# Patient Record
Sex: Female | Born: 1935
Health system: Southern US, Community
[De-identification: ages and names within clinical notes are randomized; demographics above are authoritative.]

## PROBLEM LIST (undated history)

## (undated) DIAGNOSIS — C44301 Unspecified malignant neoplasm of skin of nose: Secondary | ICD-10-CM

## (undated) DIAGNOSIS — N280 Ischemia and infarction of kidney: Secondary | ICD-10-CM

## (undated) DIAGNOSIS — I10 Essential (primary) hypertension: Secondary | ICD-10-CM

## (undated) DIAGNOSIS — E785 Hyperlipidemia, unspecified: Secondary | ICD-10-CM

## (undated) DIAGNOSIS — K219 Gastro-esophageal reflux disease without esophagitis: Secondary | ICD-10-CM

## (undated) DIAGNOSIS — I739 Peripheral vascular disease, unspecified: Secondary | ICD-10-CM

## (undated) DIAGNOSIS — C44622 Squamous cell carcinoma of skin of right upper limb, including shoulder: Secondary | ICD-10-CM

## (undated) DIAGNOSIS — E871 Hypo-osmolality and hyponatremia: Secondary | ICD-10-CM

## (undated) DIAGNOSIS — Z8619 Personal history of other infectious and parasitic diseases: Secondary | ICD-10-CM

## (undated) DIAGNOSIS — Z8639 Personal history of other endocrine, nutritional and metabolic disease: Secondary | ICD-10-CM

## (undated) HISTORY — PX: GANGLION CYST EXCISION: SHX1691

## (undated) HISTORY — PX: JOINT REPLACEMENT: SHX530

## (undated) HISTORY — PX: ABDOMINAL HYSTERECTOMY: SHX81

## (undated) HISTORY — DX: Ischemia and infarction of kidney: N28.0

## (undated) HISTORY — DX: Hyperlipidemia, unspecified: E78.5

## (undated) HISTORY — DX: Essential (primary) hypertension: I10

---

## 1997-12-19 ENCOUNTER — Encounter: Admission: RE | Admit: 1997-12-19 | Discharge: 1998-03-12 | Payer: Self-pay | Admitting: Anesthesiology

## 1998-03-12 ENCOUNTER — Encounter: Admission: RE | Admit: 1998-03-12 | Discharge: 1998-05-29 | Payer: Self-pay | Admitting: Anesthesiology

## 1998-05-29 ENCOUNTER — Encounter: Admission: RE | Admit: 1998-05-29 | Discharge: 1998-08-27 | Payer: Self-pay | Admitting: Anesthesiology

## 2000-08-25 ENCOUNTER — Encounter: Admission: RE | Admit: 2000-08-25 | Discharge: 2000-08-25 | Payer: Self-pay | Admitting: Nephrology

## 2000-08-25 ENCOUNTER — Encounter: Payer: Self-pay | Admitting: Nephrology

## 2000-09-07 ENCOUNTER — Ambulatory Visit (HOSPITAL_COMMUNITY): Admission: RE | Admit: 2000-09-07 | Discharge: 2000-09-07 | Payer: Self-pay | Admitting: Nephrology

## 2000-09-07 ENCOUNTER — Encounter: Payer: Self-pay | Admitting: Nephrology

## 2000-09-09 ENCOUNTER — Ambulatory Visit (HOSPITAL_COMMUNITY): Admission: RE | Admit: 2000-09-09 | Discharge: 2000-09-09 | Payer: Self-pay | Admitting: Nephrology

## 2000-09-19 ENCOUNTER — Encounter: Payer: Self-pay | Admitting: Nephrology

## 2000-09-19 ENCOUNTER — Ambulatory Visit (HOSPITAL_COMMUNITY): Admission: RE | Admit: 2000-09-19 | Discharge: 2000-09-19 | Payer: Self-pay | Admitting: Nephrology

## 2001-10-04 ENCOUNTER — Encounter: Payer: Self-pay | Admitting: Orthopedic Surgery

## 2001-10-11 ENCOUNTER — Inpatient Hospital Stay (HOSPITAL_COMMUNITY): Admission: RE | Admit: 2001-10-11 | Discharge: 2001-10-16 | Payer: Self-pay | Admitting: Orthopedic Surgery

## 2001-10-11 ENCOUNTER — Encounter: Payer: Self-pay | Admitting: Orthopedic Surgery

## 2002-07-16 IMAGING — XA IR ANGIO/RENAL UNI WO/W FLUSH*L*
1 series · 15 of 23 positions shown · IV contrast (omnipaque)
Comparison: none

FINDINGS
CLINICAL DATA: REFRACTORY HYPERTENSION.  ULTRASOUND EXAMINATION HAS SHOWN AN ATROPIC RIGHT KIDNEY
AND A NORMAL SIZED LEFT KIDNEY.  NUCLEAR MEDICINE STUDY HAS SHOWN MINIMAL FUNCTION OF THE RIGHT
KIDNEY.  FURTHER ASSESSMENT IS NOW PERFORMED TO EVALUATE FOR ELEVATED RENIN PRODUCTION FROM THE
ATROPHIC RIGHT KIDNEY AND ALSO ASSESS THE LEFT RENAL ARTERY FOR POTENTIAL UNDERLYING STENOSIS.
BILATERAL RENAL VENOGRAPHY:
BILATERAL RENAL VEIN AND IVC VENOUS SAMPLING:
LEFT RENAL ARTERIOGRAPHY INCLUDING ABDOMINAL AORTOGRAPHY:
CONTRAST:   50 CC OMNIPAQUE 300 FOR THE ENTIRE STUDY.
FLUORO TIME:  8.3 MINUTES.
INFORMED CONSENT WAS OBTAINED PRIOR TO THE PROCEDURE.
THE RIGHT GROIN WAS STERILELY PREPPED AND DRAPED.  LOCAL ANESTHESIA WAS PROVIDED WITH 1% LIDOCAINE.
 THE RIGHT COMMON FEMORAL VEIN WAS ACCESSED UTILIZING A MICROPUNCTURE SET AND ULTRASOUND GUIDANCE.
A DIAGNOSTIC WIRE WAS ADVANCED INTO THE IVC.  A 5 FRENCH COBRA CATHETER WAS ADVANCED INTO THE VEIN.
 CONTRAST INJECTION WAS PERFORMED UNDER FLUOROSCOPY TO CONFIRM POSITION.
THE RIGHT RENAL ORIFICE WAS SELECTIVELY CATHETERIZED WITH A CATHETER.  A VENOGRAM WAS PERFORMED TO
CONFIRM POSITIONING AND ANATOMY.
THE CATHETER WAS THEN USED TO SELECTIVELY CATHETERIZE THE ORIFICE OF THE LEFT RENAL VEIN. THE
CATHETER WAS ADVANCED OVER A GUIDEWIRE INTO A SECOND ORDER LEFT RENAL VEIN BRANCH AND CONTRAST
INJECTION VENOGRAPHY PERFORMED.

[Series 1: run · 15 of 23 slices shown]
[im 1/23]
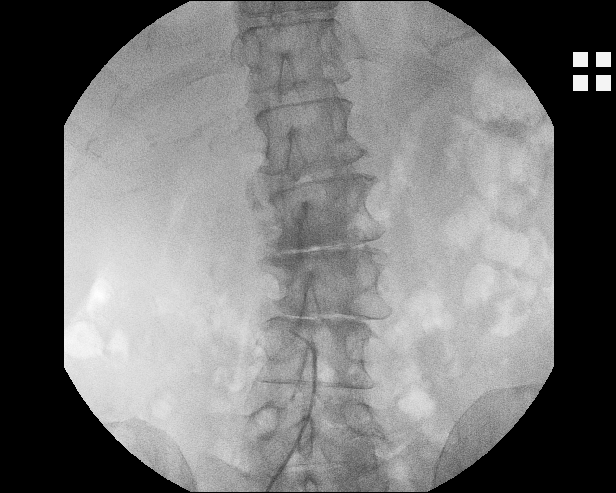
[im 3/23]
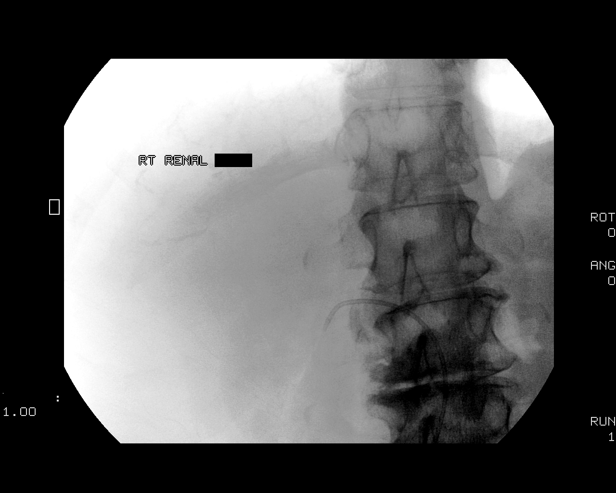
[im 4/23]
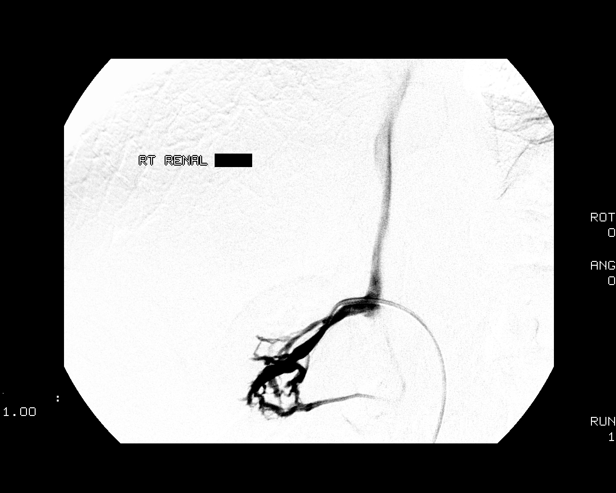
[im 6/23]
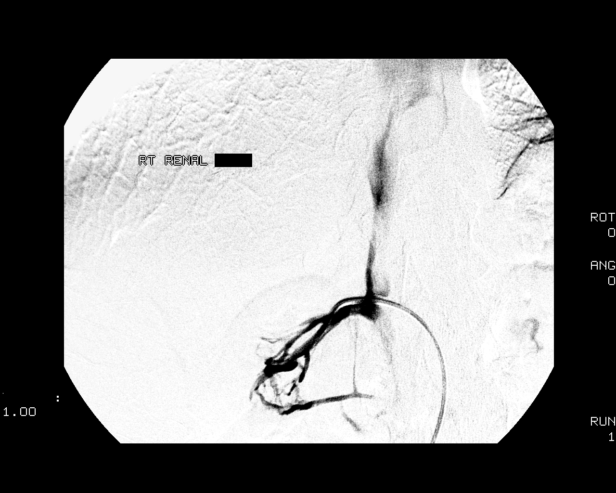
[im 7/23]
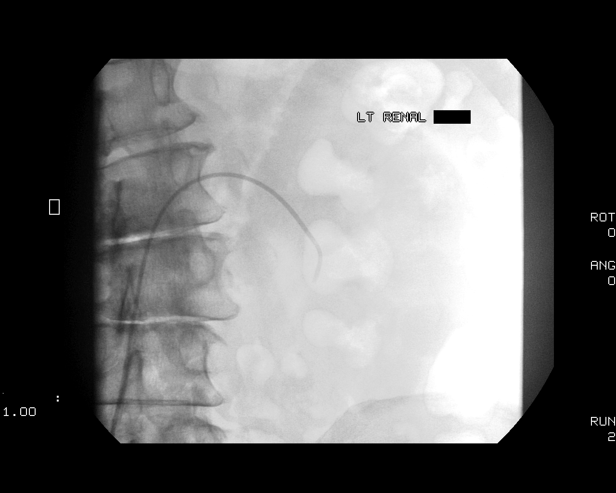
[im 9/23]
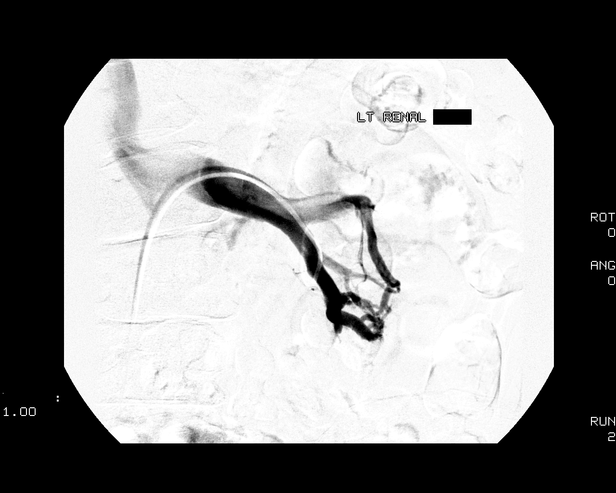
[im 10/23]
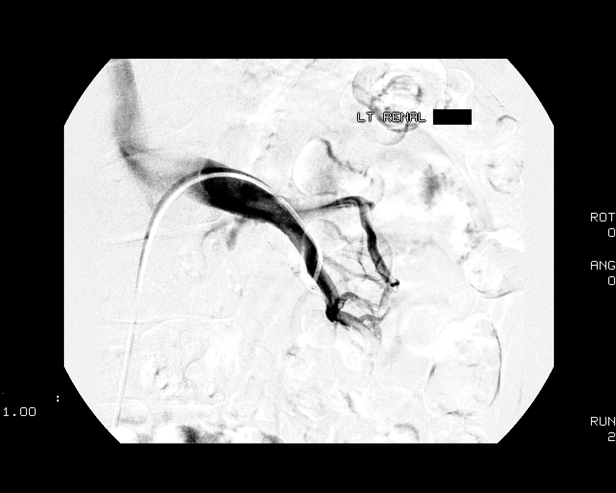
[im 12/23]
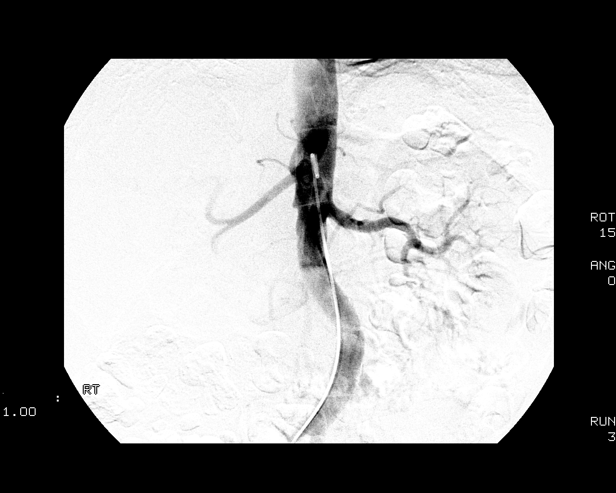
[im 14/23]
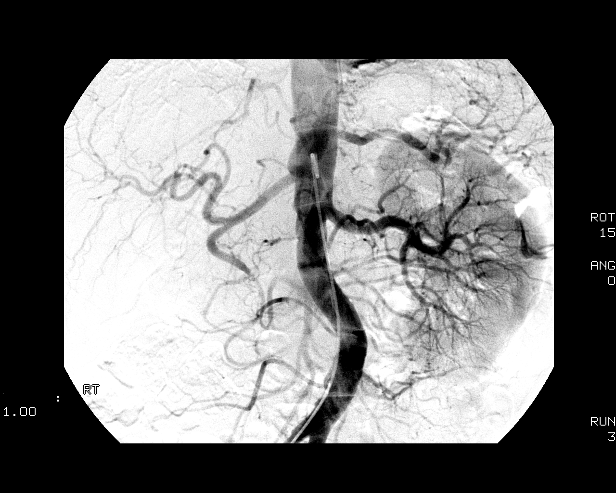
[im 15/23]
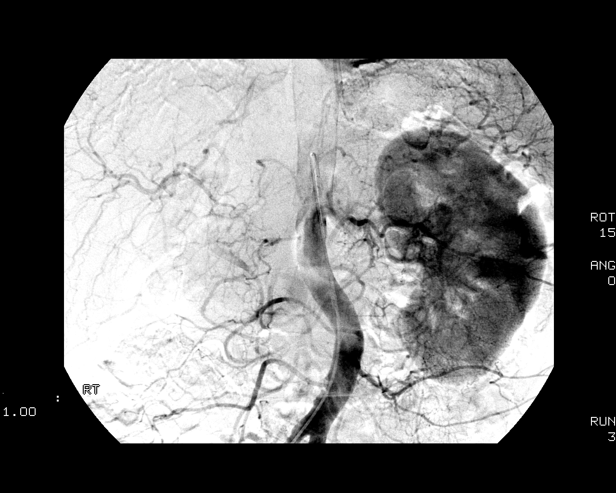
[im 17/23]
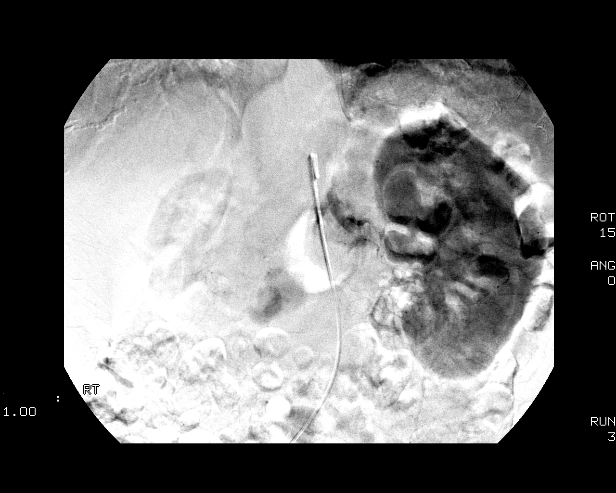
[im 18/23]
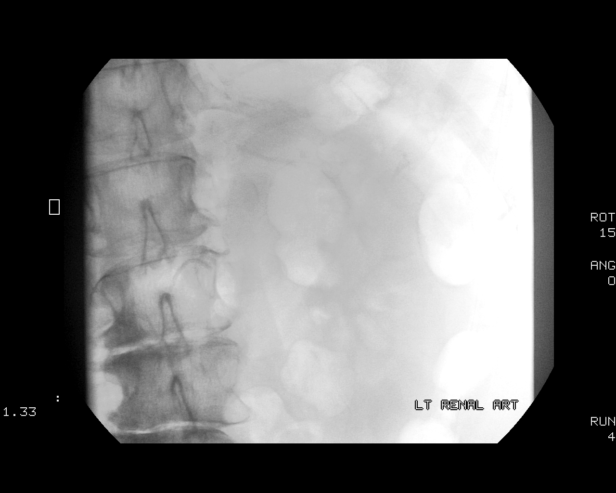
[im 20/23]
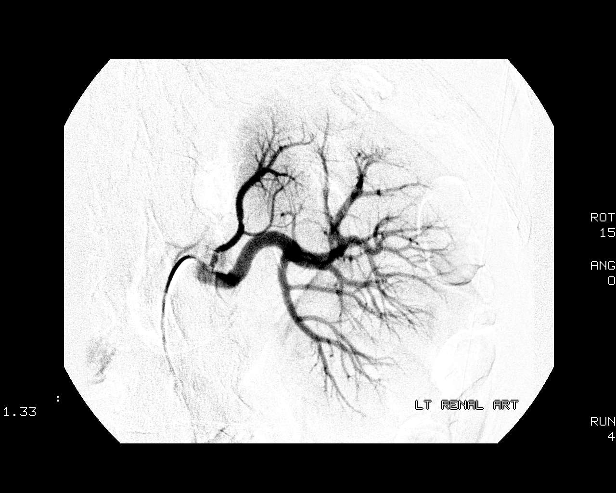
[im 21/23]
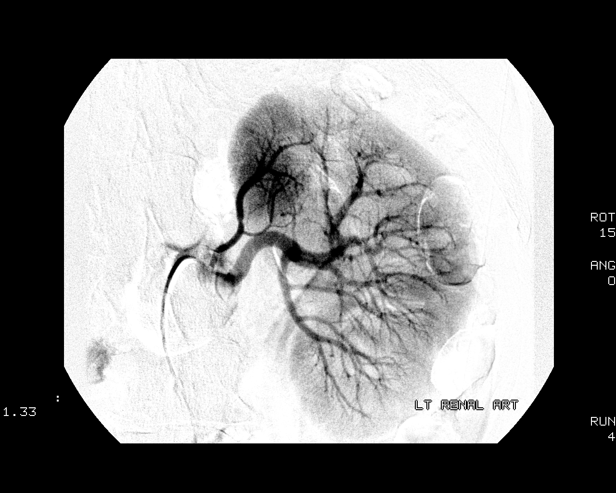
[im 23/23]
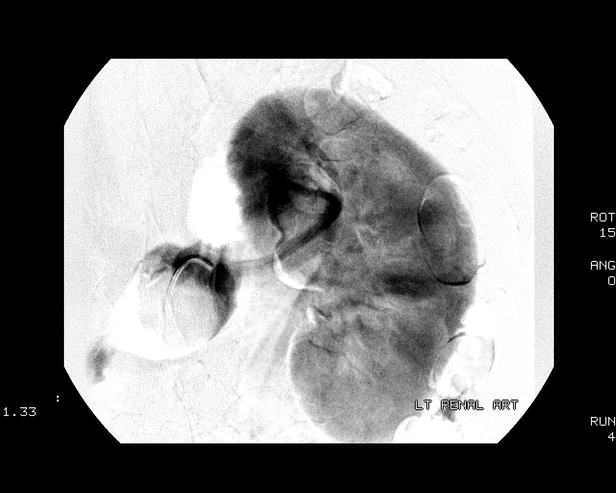

[15 of 23 positions shown; findings below may reference images not displayed]

FINDINGS: THE RIGHT RENAL VEIN IS PATENT AND DIFFUSELY SMALL IN CALIBER.  BRANCH VESSELS ARE ALSO SMALL AND
DRAIN A VISIBLY ATROPHIC KIDNEY.  NO EVIDENCE OF THROMBUS IN THE RIGHT RENAL VEIN.
LEFT RENAL VEIN VENOGRAPHY WAS PERFORMED WITH INJECTION AT THE LEVEL OF A SECOND ORDER INTRARENAL
BRANCH.  THE LEFT RENAL VEIN IS WIDELY PATENT AND NORMALLY POSITIONED WITHOUT ANATOMIC ABNORMALITY.
 NO EVIDENCE OF VENOUS THROMBUS.
IMPRESSION
RENAL VEIN VENOGRAPHY PRIOR TO VENOUS SAMPLING SHOWS AN ATROPHIC RIGHT RENAL VEIN AND NORMAL LEFT
RENAL VEIN.  BOTH VEINS SHOW FLOW WITH NO EVIDENCE OF THROMBUS OR UNDERLYING ANATOMIC VARIATION IN
POSITION.
BILATERAL RENAL VEIN SAMPLING:
AFTER SELECTIVE CATHETERIZATION FOR BILATERAL RENAL VENOGRAPHY, VENOUS SAMPLES WERE OBTAINED FROM
BOTH THE LEFT AND RIGHT RENAL VEINS FOR RENIN LEVELS.  SEPARATE IVC SAMPLES WERE ALSO DRAWN FROM
THE SUPRARENAL AND INFRARENAL IVC.   SAMPLES WERE LABELED IN STERILE TUBES AND SENT TO THE
LABORATORY.
IMPRESSION: SELECTIVE VENOUS SAMPLING OF BOTH RENAL VEINS AS WELL AS THE SUPRARENAL AND INFRARENAL IVC.
LEFT RENAL ARTERIOGRAPHY INCLUDING ABDOMINAL AORTOGRAM:
AFTER THE VENOUS PROCEDURE, THE VENOUS CATHETER WAS REMOVED AND HEMOSTASIS OBTAINED WITH MANUAL
COMPRESSION.  THE RIGHT COMMON FEMORAL ARTERY WAS THEN ACCESSED WITH A MICROPUNTURE SET UNDER
ULTRASOUND GUIDANCE.  A 4 FRENCH PIGTAIL CATHETER WAS ADVANCED INTO THE MID-ABDOMINAL AORTA AND A
FRONTAL PROJECTION AORTOGRAM PERFORMED.  THE CATHETER WAS THEN EXCHANGED OVER A GUIDEWIRE FOR A 4
FRENCH COBRA CATHETER.  THIS WAS USED TO SELECTIVELY CATHETERIZE THE LEFT RENAL ARTERY.  SELECTIVE
LEFT RENAL ARTERIOGRAPHY WAS THEN PERFORMED.
AFTER THE PROCEDURE, THE CATHETER WAS REMOVED AND HEMOSTASIS OBTAINED WITH MANUAL COMPRESSION.
COMPLICATIONS:  NONE.
FINDINGS: THE RIGHT RENAL ARTERY IS CHRONICALLY OCCLUDED WITH NO ANTEGRADE FLOW PRESENT BEYOND THE ORIGIN OF
THE VESSEL.  A SMALL STUMP IS PRESENT WHERE THE NATIVE RIGHT RENAL ARTERY HAS OCCLUDED PROXIMALLY.
CAPSULAR BRANCHES ARE RECONSTITUTED BY COLLATERALS WITH DELAYED VISUALIZATION OF A VERY FAINT RIGHT
NEPHROGRAM AND A VERY SMALL RIGHT KIDNEY.
THE LEFT KIDNEY IS SUPPLIED BY A SINGLE ARTERY WHICH DEMONSTRATES AN EARLY BIFURCATION.  THIS
ARTERY AND ITS BRANCHES ARE WIDELY PATENT WITH NO EVIDENCE OF STENOSIS.  A FULL AND NORMAL
NEPHROGRAM IS ACHIEVED WITH NO EVIDENCE OF DEFECT.  NO ACCESSORY VESSELS WERE IDENTIFIED.  NO
EVIDENCE OF RENAL ARTERY ANEURYSM OR FINDINGS OF FIBROMUSCULAR DISEASE.  VENOUS PHASE IS NORMAL
WITH PATENT FLOW NOTED IN THE LEFT RENAL VEIN.
THE REST OF THE ABDOMINAL AORTA IS UNREMARKABLE.  THE LOWER ABDOMINAL AORTIC SEGMENT IS TORTUOUS
WITHOUT EVIDENCE OF ANEURYSM OR STENOSIS.  OTHER VISUALIZED BRANCH VESSELS OFF OF THE ABDOMINAL
AORTA ARE UNREMARKABLE IN THE FRONTAL PROJECTION.
IMPRESSION: 1.  CHRONIC OCCLUSION OF THE RIGHT RENAL ARTERY.  FAINT NEPHROGRAM IS SEEN ON A DELAYED BASIS BY
EVENTUAL RECONSTITUTION OF CAPSULAR COLLATERAL VESSELS.
2.  WIDELY PATENT LEFT RENAL ARTERY AND LEFT RENAL ARTERY BRANCHES.  NO EVIDENCE OF UNDERLYING FMD.

## 2003-03-16 HISTORY — PX: PR VEIN BYPASS GRAFT,AORTO-FEM-POP: 35551

## 2003-03-21 ENCOUNTER — Ambulatory Visit (HOSPITAL_COMMUNITY): Admission: RE | Admit: 2003-03-21 | Discharge: 2003-03-21 | Payer: Self-pay | Admitting: Vascular Surgery

## 2003-07-08 ENCOUNTER — Inpatient Hospital Stay (HOSPITAL_COMMUNITY): Admission: RE | Admit: 2003-07-08 | Discharge: 2003-07-12 | Payer: Self-pay | Admitting: Vascular Surgery

## 2004-11-25 ENCOUNTER — Ambulatory Visit: Payer: Self-pay | Admitting: Gastroenterology

## 2004-12-10 ENCOUNTER — Encounter (INDEPENDENT_AMBULATORY_CARE_PROVIDER_SITE_OTHER): Payer: Self-pay | Admitting: *Deleted

## 2004-12-10 ENCOUNTER — Ambulatory Visit: Payer: Self-pay | Admitting: Gastroenterology

## 2005-04-29 IMAGING — CR DG CHEST 2V
2 series · 2 of 2 positions shown · non-contrast
Comparison: none

CLINICAL DATA: Femoral artery occlusion.  Bypass graft.  The patient is a nonsmoker, has hypertension.  No present chest complaints.
 TWO VIEW CHEST 
 PA and lateral views of the chest are made on 07/04/03 at 1919 hours and are compared to previous studies of 03/19/03 and show no significant interval change or active disease.  Peribronchial markings are minimally accentuated.  The heart, mediastinum, bony thorax and soft tissues are within the normal limit for the patient?s age.

[view not recorded (1 of 2)]
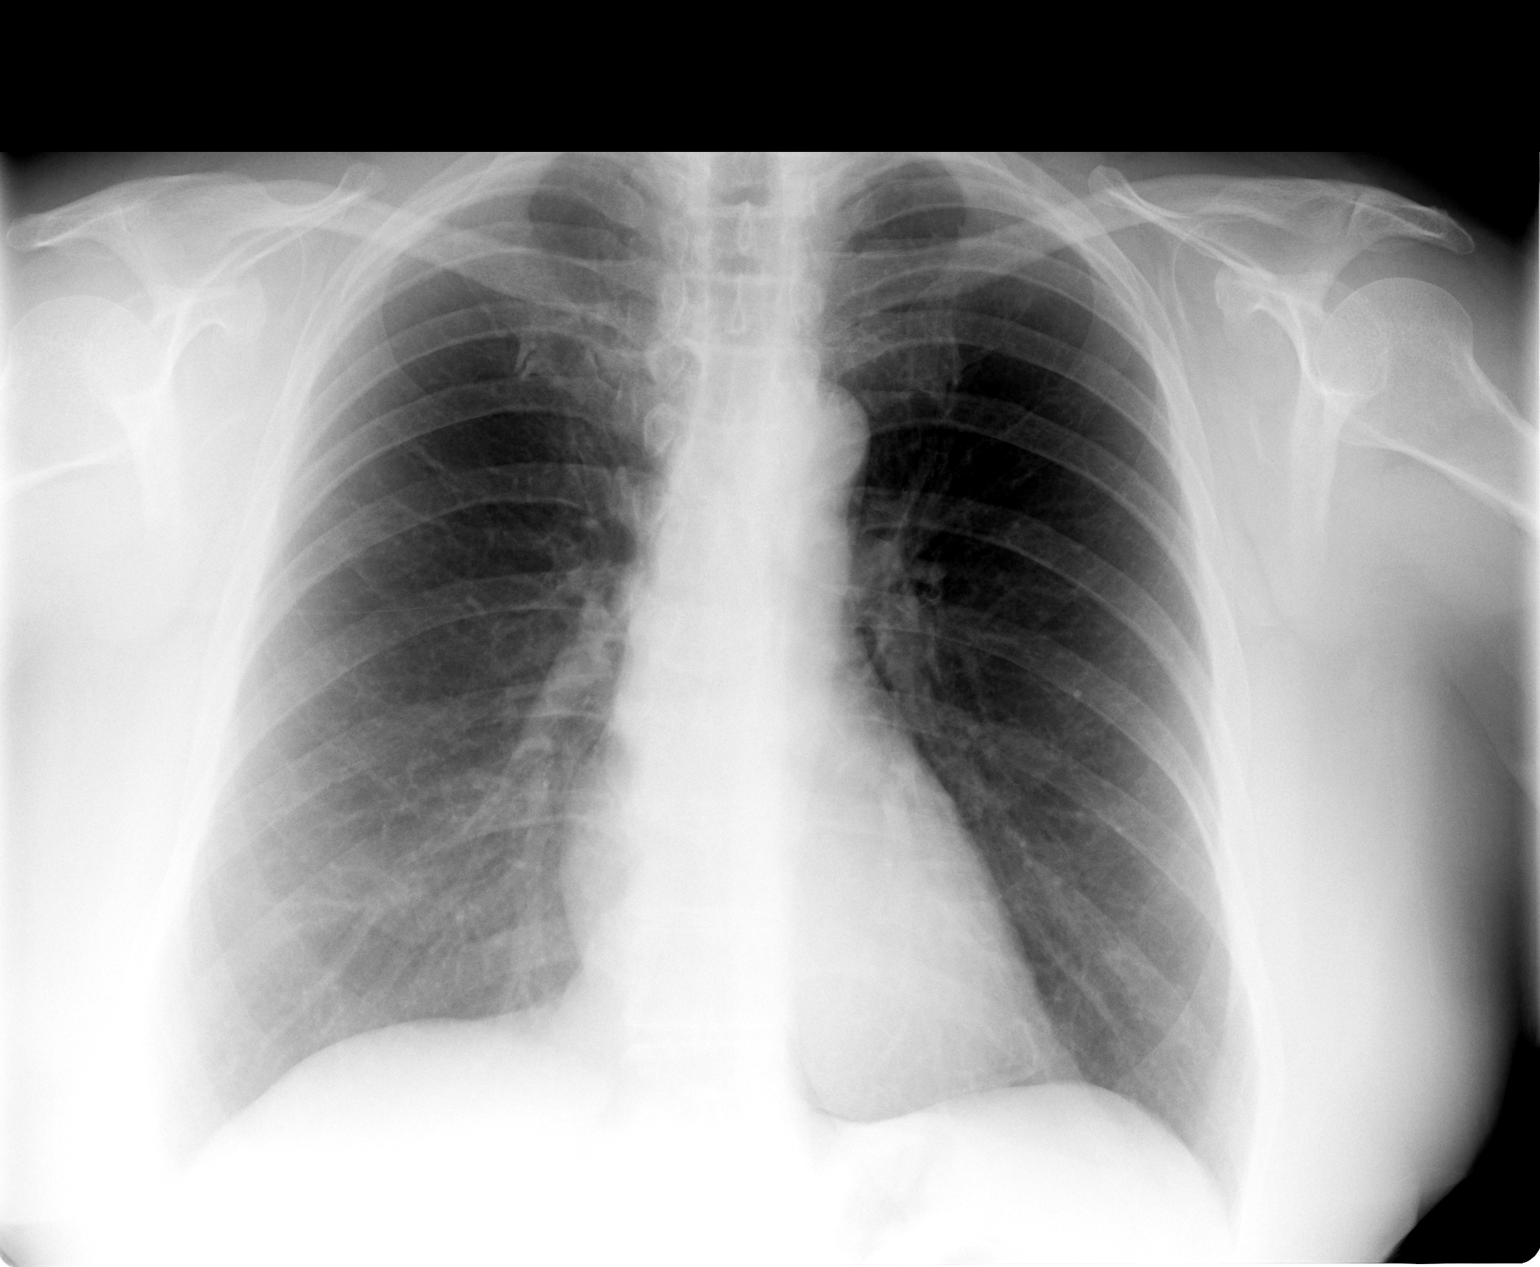

[view not recorded (2 of 2)]
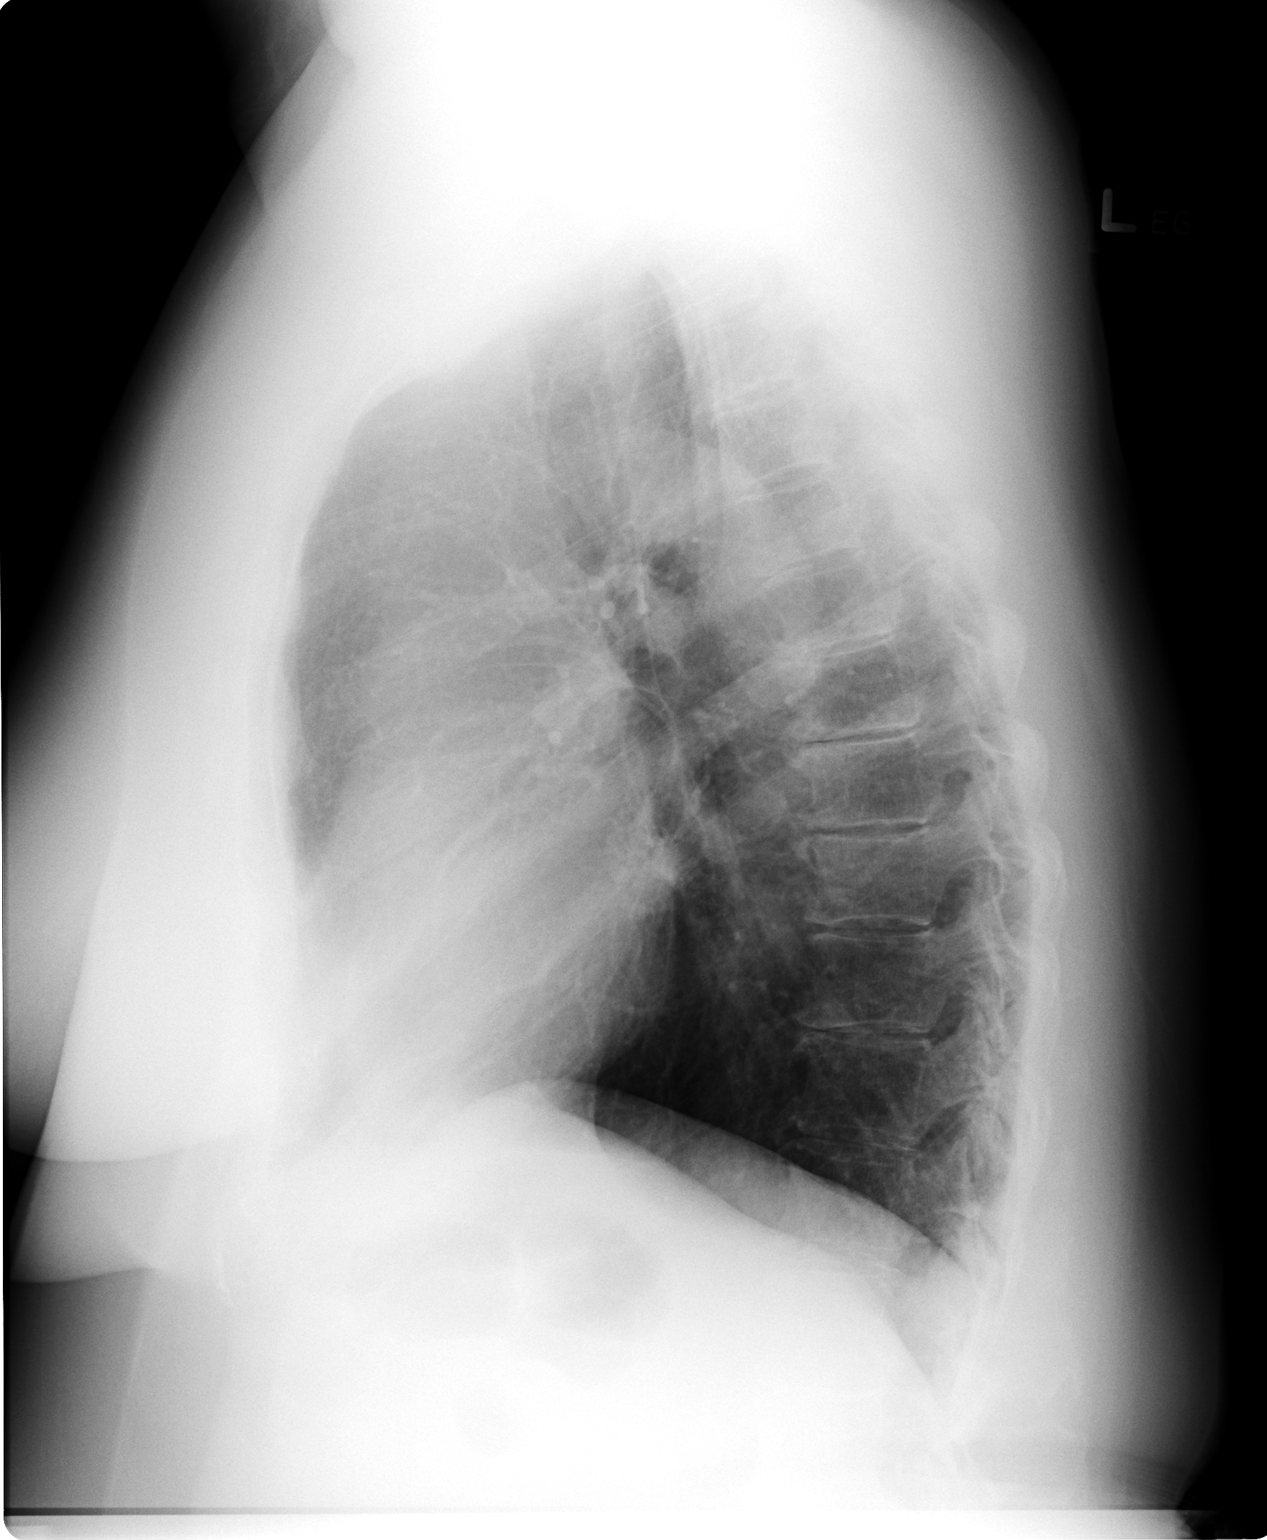

[2 of 2 positions shown; findings below may reference images not displayed]

IMPRESSION: No evidence of active disease or significant interval change in the chest.

## 2006-06-30 ENCOUNTER — Ambulatory Visit: Payer: Self-pay | Admitting: Vascular Surgery

## 2007-01-17 ENCOUNTER — Ambulatory Visit: Payer: Self-pay | Admitting: Vascular Surgery

## 2007-08-01 ENCOUNTER — Ambulatory Visit: Payer: Self-pay | Admitting: Vascular Surgery

## 2008-01-23 ENCOUNTER — Ambulatory Visit: Payer: Self-pay | Admitting: Vascular Surgery

## 2008-07-23 ENCOUNTER — Ambulatory Visit: Payer: Self-pay | Admitting: Vascular Surgery

## 2009-02-04 ENCOUNTER — Ambulatory Visit: Payer: Self-pay | Admitting: Vascular Surgery

## 2009-10-23 ENCOUNTER — Encounter (INDEPENDENT_AMBULATORY_CARE_PROVIDER_SITE_OTHER): Payer: Self-pay | Admitting: *Deleted

## 2010-03-11 ENCOUNTER — Ambulatory Visit: Payer: Self-pay | Admitting: Vascular Surgery

## 2010-04-14 NOTE — Letter (Signed)
Summary: Colonoscopy Letter  [facility]  [address] [place], [place] [address]   Phone: [phone]  Fax: [phone]      October 23, 2009 MRN: [id number]   [facility] [address] [place], [place]  [address]   Dear Ms. Laprade,   According to your medical record, it is time for you to schedule a Colonoscopy. The American Cancer Society recommends this procedure as a method to detect early colon cancer. Patients with a family history of colon cancer, or a personal history of colon polyps or inflammatory bowel disease are at increased risk.  This letter has beeen generated based on the recommendations made at the time of your procedure. If you feel that in your particular situation this may no longer apply, please contact our office.  Please call our office at [phone] to schedule this appointment or to update your records at your earliest convenience.  Thank you for cooperating with [place] to provide you with the very best care possible.   Sincerely,  Judie Petit T. Russella Dar, M.D.  [facility] [phone]

## 2010-04-28 ENCOUNTER — Encounter (INDEPENDENT_AMBULATORY_CARE_PROVIDER_SITE_OTHER): Payer: Medicare Other | Admitting: Vascular Surgery

## 2010-04-28 ENCOUNTER — Other Ambulatory Visit (INDEPENDENT_AMBULATORY_CARE_PROVIDER_SITE_OTHER): Payer: Medicare Other

## 2010-04-28 DIAGNOSIS — I701 Atherosclerosis of renal artery: Secondary | ICD-10-CM

## 2010-04-28 DIAGNOSIS — I70219 Atherosclerosis of native arteries of extremities with intermittent claudication, unspecified extremity: Secondary | ICD-10-CM

## 2010-04-29 NOTE — Consult Note (Signed)
NEW PATIENT CONSULTATION  Erickson, Stacy DOB:  03-13-1936                                       04/28/2010 CHART#:[id number]  Patient is a 75 year old female known to me several years ago, having performed a left femoral-popliteal bypass grafting in 2005.  She is known to have occlusion of her right renal artery and had a widely patent left renal artery in 2005.  She was sent today for follow-up of her left renal artery since she does have a contralateral occlusion. She states her blood pressure has been under good control on 3 medications (Lasix, Diovan, and Altace) and also states that her renal function is stable (today I checked results of recent renal function in Dr. Laurey Morale office with creatinine of 1.1 and a BUN of 33).  CHRONIC MEDICAL PROBLEMS: 1. Hypertension. 2. Hyperlipidemia. 3. Known left right renal artery occlusion. 4. Negative for coronary artery disease, diabetes, COPD, or stroke. 5. Degenerative joint disease, status post left total hip replacement.  SOCIAL HISTORY:  The patient is widowed, is retired.  Does not smoke cigarettes since 2003.  Drinks occasional Mcgroarty of wine.  FAMILY HISTORY:  Positive for stroke and Parkinson's in her father. Coronary artery disease in a sister.  Negative for diabetes.  REVIEW OF SYSTEMS:  Positive for decreased visual acuity, arthritis, joint pain, muscle pain, leg discomfort while lying flat (restless legs) plus dysuria.  All other systems are negative in complete review of systems.  PHYSICAL EXAMINATION:  Blood pressure 164/88, heart rate 82, respirations 16.  General:  She is a well-developed and well-nourished female in no apparent distress.  Alert and oriented x3.  HEENT:  Normal for age.  EOMs intact.  Lungs:  Clear to auscultation.  No rhonchi or wheezing.  Cardiovascular:  Regular rhythm.  No murmurs.  Carotid pulses are 3+.  No audible bruits.  Abdomen:  Soft, nontender with no masses. No bruits  are heard.  Musculoskeletal:  Free of major deformities. Neurologic:  Normal.  Skin:  Free of rashes.  Lower extremity exam reveals 3+ femoral and 2+ dorsalis pedis pulses bilaterally.  Today I ordered a duplex scan of her left renal artery and kidney, which I have reviewed and interpreted.  There is no evidence of any renal artery stenosis or fibromuscular disease in the left renal artery, and the kidney size is normal at 10.7 cm.  I reassured her regarding these findings.  Unless she develops poorly controlled hypertension or worsening renal function, I think it would be safe to wait 4 or 5 years before repeating any study on her left renal artery, which appears to be free of disease at this point consistent with the study 7 years ago.    Quita Skye Hart [place], M.D. Electronically Signed  JDL/MEDQ  D:  04/28/2010  T:  04/29/2010  Job:  [id number]  cc:   Loraine Leriche A. Perini, M.D.

## 2010-05-05 NOTE — Procedures (Unsigned)
RENAL ARTERY DUPLEX EVALUATION  INDICATION:  FMD:  Right kidney/artery nonfunctioning.  HISTORY: Diabetes:  No. Cardiac:  No. Hypertension:  Yes. Smoking:  Yes.  RENAL ARTERY DUPLEX FINDINGS: Aorta-Proximal:  51 cm/s Aorta-Mid:  NV Aorta-Distal:  NV Celiac Artery Origin:  117 cm/s SMA Origin:  216 cm/s                                   RIGHT               LEFT Renal Artery Origin:             cm/s                88 cm/s Renal Artery Proximal:           cm/s                124 cm/s Renal Artery Mid:                cm/s                161 cm/s Renal Artery Distal:             cm/s                89 cm/s Hilar Acceleration Time (AT):    m/s2                m/s2 Renal-Aortic Ratio (RAR):                            3.1 Kidney Size:                                         10.7 cm End Diastolic Ratio (EDR): Resistive Index (RI):                                0.53/0.64  IMPRESSION: 1. No evidence of RAS or active FMD in the left renal artery. 2. Kidney size and RI within normal limits. 3. Patent renal vein.  ___________________________________________ Quita Skye. Hart [place], M.D.  LT/MEDQ  D:  04/28/2010  T:  04/28/2010  Job:  [id number]

## 2010-07-28 NOTE — Procedures (Signed)
BYPASS GRAFT EVALUATION   INDICATION:  Follow up left fem-pop graft.   HISTORY:  Diabetes:  No.  Cardiac:  No.  Hypertension:  Yes.  Smoking:  Previous.  Previous Surgery:  07/08/2003, fem-pop graft placed in left lower  extremity.   SINGLE LEVEL ARTERIAL EXAM                               RIGHT              LEFT  Brachial:                    131                123  Anterior tibial:             122                115  Posterior tibial:            118                114  Peroneal:  Ankle/brachial index:        0.93               0.88   PREVIOUS ABI:  Date: 02/04/09  RIGHT:  0.96  LEFT:  0.93   LOWER EXTREMITY BYPASS GRAFT DUPLEX EXAM:   DUPLEX:  1. Widely patent left fem-pop graft.  2. Stenotic inflow with velocities measuring 366 cm/s.  3. Waveforms are triphasic throughout the graft.   IMPRESSION:  1. Slightly decreased ankle brachial indices from prior examination.  2. Widely patent left femoropopliteal graft.  3. Inflow stenosis observed.   ___________________________________________  Di Kindle. Edilia Bo, M.D.   LT/MEDQ  D:  03/11/2010  T:  03/11/2010  Job:  [id number]

## 2010-07-28 NOTE — Procedures (Signed)
BYPASS GRAFT EVALUATION   INDICATION:  Followup, left fem-pop bypass graft.   HISTORY:  Diabetes:  No.  Cardiac:  No.  Hypertension:  Yes.  Smoking:  Quit in 2005.  Previous Surgery:  Left fem-pop bypass graft.   SINGLE LEVEL ARTERIAL EXAM                               RIGHT              LEFT  Brachial:                    171                164  Anterior tibial:             120                129  Posterior tibial:            143                146  Peroneal:  Ankle/brachial index:        0.84               0.85   PREVIOUS ABI:  Date: 01/17/07  RIGHT:  0.84  LEFT:  0.83   LOWER EXTREMITY BYPASS GRAFT DUPLEX EXAM:   DUPLEX:  Biphasic waveforms noted throughout the left lower extremity  bypass graft and its native vessels with no evidence of stenosis noted.   IMPRESSION:  1. Patent left femoropopliteal bypass graft with no evidence of      stenosis.  2. Stable bilateral ankle brachial indices are noted.   ___________________________________________  Di Kindle. Edilia Bo, M.D.   CH/MEDQ  D:  08/01/2007  T:  08/01/2007  Job:  [id number]

## 2010-07-28 NOTE — Procedures (Signed)
BYPASS GRAFT EVALUATION   INDICATION:  Follow-up left lower extremity bypass graft.   HISTORY:  Diabetes:  No  Cardiac:  No  Hypertension:  Yes  Smoking:  Previous  Previous Surgery:  Left femoral-popliteal bypass artery graft July 08, 2003.   SINGLE LEVEL ARTERIAL EXAM                               RIGHT              LEFT  Brachial:                    151                150  Anterior tibial:             133                132  Posterior tibial:            137                143  Peroneal:  Ankle/brachial index:        0.91               0.95   PREVIOUS ABI:  Date: January 23, 2008  RIGHT:  0.88  LEFT:  0.9   LOWER EXTREMITY BYPASS GRAFT DUPLEX EXAM:   DUPLEX:  Doppler arterial waveforms appear biphasic, proximal to,  within, and distal to bypass graft.   IMPRESSION:  Patent left femoral-popliteal bypass graft.   Stable ankle-brachial indices bilaterally.   No significant changes from previous study.       ___________________________________________  Stacy Erickson, M.D.   AS/MEDQ  D:  07/23/2008  T:  07/23/2008  Job:  [id number]

## 2010-07-28 NOTE — Procedures (Signed)
BYPASS GRAFT EVALUATION   INDICATION:  Follow-up left lower extremity bypass graft.   HISTORY:  Diabetes:  No.  Cardiac:  No.  Hypertension:  Yes.  Smoking:  Previous.  Previous Surgery:  Left fem-pop bypass graft on 07/08/2003.   SINGLE LEVEL ARTERIAL EXAM                               RIGHT              LEFT  Brachial:                    126                120  Anterior tibial:             111                106  Posterior tibial:            108                113  Peroneal:  Ankle/brachial index:        0.88               0.9   PREVIOUS ABI:  Date: 08/01/2007  RIGHT:  0.84  LEFT:  0.85   LOWER EXTREMITY BYPASS GRAFT DUPLEX EXAM:   DUPLEX:  Biphasic Doppler waveforms noted throughout the left lower  extremity bypass graft and its native vessels with no evidence of  stenosis noted.   IMPRESSION:  1. Patent left fem-pop bypass graft with no evidence of stenosis      noted.  2. Stable bilateral ankle brachial indices noted.   ___________________________________________  Di Kindle. Edilia Bo, M.D.   CH/MEDQ  D:  01/23/2008  T:  01/23/2008  Job:  [id number]

## 2010-07-28 NOTE — Procedures (Signed)
BYPASS GRAFT EVALUATION   INDICATION:  Follow-up of left femoral to popliteal artery bypass graft.   HISTORY:  Diabetes:  No  Cardiac:  No  Hypertension:  Yes  Smoking:  Previous  Previous Surgery:  Left femoral to popliteal bypass artery graft on  July 08, 2003.   SINGLE LEVEL ARTERIAL EXAM                               RIGHT              LEFT  Brachial:                    122                118  Anterior tibial:             114                113  Posterior tibial:            117                109  Peroneal:  Ankle/brachial index:        0.96               0.93   PREVIOUS ABI:  Date: Jul 23, 2008  RIGHT:  0.91  LEFT:  0.95   LOWER EXTREMITY BYPASS GRAFT DUPLEX EXAM:   DUPLEX:  Left femoral to popliteal artery bypass graft appears patent  with biphasic waveforms proximally within ad distally to the graft.   IMPRESSION:  1. Ankle brachial indices appear stable from previous study.  2. Patent left femoral to popliteal artery bypass graft with no focal      stenosis.   ___________________________________________  Quita Skye Hart [place], M.D.   CB/MEDQ  D:  02/04/2009  T:  02/04/2009  Job:  [id number]

## 2010-07-28 NOTE — Procedures (Signed)
BYPASS GRAFT EVALUATION   INDICATION:  Follow up left fem-pop bypass graft.   HISTORY:  Diabetes:  No.  Cardiac:  No.  Hypertension:  Yes.  Smoking:  Quit 3 years ago.  Previous Surgery:  Please see above.   SINGLE LEVEL ARTERIAL EXAM                               RIGHT              LEFT  Brachial:                    120                127  Anterior tibial:             88                 101  Posterior tibial:            107                106  Peroneal:  Ankle/brachial index:        0.84               0/83   PREVIOUS ABI:  Date: June 20, 2006  RIGHT:  0.65  LEFT:  0.82   LOWER EXTREMITY BYPASS GRAFT DUPLEX EXAM:   DUPLEX:  Patent left fem-pop bypass throughout with no evidence of focal  stenosis   IMPRESSION:  1. Moderately abnormal ankle-brachial index with biphasic Doppler wave      form noted in bilateral legs.  2. Status post left femoral-popliteal bypass graft.   ___________________________________________  Di Kindle. Edilia Bo, M.D.   MG/MEDQ  D:  01/17/2007  T:  01/18/2007  Job:  [id number]

## 2011-03-24 DIAGNOSIS — H25019 Cortical age-related cataract, unspecified eye: Secondary | ICD-10-CM | POA: Diagnosis not present

## 2011-05-21 DIAGNOSIS — Z1231 Encounter for screening mammogram for malignant neoplasm of breast: Secondary | ICD-10-CM | POA: Diagnosis not present

## 2011-07-13 DIAGNOSIS — D509 Iron deficiency anemia, unspecified: Secondary | ICD-10-CM | POA: Diagnosis not present

## 2011-07-13 DIAGNOSIS — I10 Essential (primary) hypertension: Secondary | ICD-10-CM | POA: Diagnosis not present

## 2011-07-13 DIAGNOSIS — N182 Chronic kidney disease, stage 2 (mild): Secondary | ICD-10-CM | POA: Diagnosis not present

## 2011-07-13 DIAGNOSIS — E785 Hyperlipidemia, unspecified: Secondary | ICD-10-CM | POA: Diagnosis not present

## 2011-07-20 DIAGNOSIS — E785 Hyperlipidemia, unspecified: Secondary | ICD-10-CM | POA: Diagnosis not present

## 2011-07-20 DIAGNOSIS — I10 Essential (primary) hypertension: Secondary | ICD-10-CM | POA: Diagnosis not present

## 2011-07-20 DIAGNOSIS — J449 Chronic obstructive pulmonary disease, unspecified: Secondary | ICD-10-CM | POA: Diagnosis not present

## 2011-07-20 DIAGNOSIS — Z124 Encounter for screening for malignant neoplasm of cervix: Secondary | ICD-10-CM | POA: Diagnosis not present

## 2011-07-20 DIAGNOSIS — Z0000 Encounter for general adult medical examination without abnormal findings: Secondary | ICD-10-CM | POA: Diagnosis not present

## 2011-07-20 DIAGNOSIS — K219 Gastro-esophageal reflux disease without esophagitis: Secondary | ICD-10-CM | POA: Diagnosis not present

## 2011-07-26 DIAGNOSIS — Z1212 Encounter for screening for malignant neoplasm of rectum: Secondary | ICD-10-CM | POA: Diagnosis not present

## 2011-09-01 DIAGNOSIS — Z23 Encounter for immunization: Secondary | ICD-10-CM | POA: Diagnosis not present

## 2011-10-20 ENCOUNTER — Encounter: Payer: Self-pay | Admitting: Vascular Surgery

## 2011-11-17 ENCOUNTER — Encounter: Payer: Self-pay | Admitting: Gastroenterology

## 2011-12-09 ENCOUNTER — Other Ambulatory Visit: Payer: Self-pay | Admitting: *Deleted

## 2011-12-09 DIAGNOSIS — Z48812 Encounter for surgical aftercare following surgery on the circulatory system: Secondary | ICD-10-CM

## 2011-12-09 DIAGNOSIS — I70219 Atherosclerosis of native arteries of extremities with intermittent claudication, unspecified extremity: Secondary | ICD-10-CM

## 2011-12-10 ENCOUNTER — Encounter: Payer: Self-pay | Admitting: Vascular Surgery

## 2011-12-13 ENCOUNTER — Encounter: Payer: Self-pay | Admitting: Neurosurgery

## 2011-12-14 ENCOUNTER — Ambulatory Visit (INDEPENDENT_AMBULATORY_CARE_PROVIDER_SITE_OTHER): Payer: Medicare Other | Admitting: Neurosurgery

## 2011-12-14 ENCOUNTER — Encounter: Payer: Self-pay | Admitting: Neurosurgery

## 2011-12-14 ENCOUNTER — Encounter (INDEPENDENT_AMBULATORY_CARE_PROVIDER_SITE_OTHER): Payer: Medicare Other | Admitting: *Deleted

## 2011-12-14 VITALS — BP 120/80 | HR 77 | Resp 16 | Ht 63.0 in | Wt 170.0 lb

## 2011-12-14 DIAGNOSIS — Z48812 Encounter for surgical aftercare following surgery on the circulatory system: Secondary | ICD-10-CM | POA: Diagnosis not present

## 2011-12-14 DIAGNOSIS — I70219 Atherosclerosis of native arteries of extremities with intermittent claudication, unspecified extremity: Secondary | ICD-10-CM | POA: Diagnosis not present

## 2011-12-14 DIAGNOSIS — I739 Peripheral vascular disease, unspecified: Secondary | ICD-10-CM | POA: Insufficient documentation

## 2011-12-14 NOTE — Progress Notes (Signed)
[facility] Office Note  CC: PD surveillance Referring Physician: Edilia Bo  History of Present Illness: 76 year old female patient of Dr. Edilia Bo who status post left femoral below the knee popliteal artery bypass graft in 2005. Patient denies claudication, rest pain or open ulcerations on the lower extremities. The patient states she has no problems performing and completing her ADLs.  Past Medical History  Diagnosis Date  . Hyperlipidemia   . Hypertension   . Renal artery occlusion     ROS: [x]  Positive   [ ]  Denies    General: [ ]  Weight loss, [ ]  Fever, [ ]  chills Neurologic: [ ]  Dizziness, [ ]  Blackouts, [ ]  Seizure [ ]  Stroke, [ ]  "Mini stroke", [ ]  Slurred speech, [ ]  Temporary blindness; [ ]  weakness in arms or legs, [ ]  Hoarseness Cardiac: [ ]  Chest pain/pressure, [ ]  Shortness of breath at rest [ ]  Shortness of breath with exertion, [ ]  Atrial fibrillation or irregular heartbeat Vascular: [ x] Pain in legs with walking, [ ]  Pain in legs at rest, [ ]  Pain in legs at night,  [ ]  Non-healing ulcer, [ ]  Blood clot in vein/DVT,   Pulmonary: [ ]  Home oxygen, [ x] Productive cough, [ ]  Coughing up blood, [ ]  Asthma,  [ ]  Wheezing Musculoskeletal:  [ ]  Arthritis, [ ]  Low back pain, [ ]  Joint pain Hematologic: [ ]  Easy Bruising, [ ]  Anemia; [ ]  Hepatitis Gastrointestinal: [ ]  Blood in stool, [ ]  Gastroesophageal Reflux/heartburn, [ ]  Trouble swallowing Urinary: [ ]  chronic Kidney disease, [ ]  on HD - [ ]  MWF or [ ]  TTHS, [ ]  Burning with urination, [ ]  Difficulty urinating Skin: [ ]  Rashes, [ ]  Wounds Psychological: [ ]  Anxiety, [ ]  Depression   Social History History  Substance Use Topics  . Smoking status: Former Smoker -- 2.0 packs/day for 35 years    Types: Cigarettes    Quit date: 12/09/2008  . Smokeless tobacco: Not on file  . Alcohol Use: Yes    Family History Family History  Problem Relation Age of Onset  . Cancer Mother   .  Heart disease Father     Allergies  Allergen Reactions  . Codeine Nausea And Vomiting  . Sulfa Antibiotics     Current Outpatient Prescriptions  Medication Sig Dispense Refill  . aspirin 81 MG tablet Take 81 mg by mouth daily.      . Cholecalciferol (VITAMIN D3) 1000 UNITS CAPS Take 1,000 Units by mouth daily.      Marland Kitchen estradiol (CLIMARA - DOSED IN MG/24 HR) 0.025 mg/24hr Place 1 patch onto the skin once a week.      . furosemide (LASIX) 40 MG tablet Take 40 mg by mouth daily.      Marland Kitchen loratadine (CLARITIN) 10 MG tablet Take 10 mg by mouth daily.      Marland Kitchen LORazepam (ATIVAN) 0.5 MG tablet Take 0.5 mg by mouth at bedtime.      . Multiple Vitamin (MULTIVITAMIN) capsule Take 1 capsule by mouth daily.      . nicotine (NICODERM CQ - DOSED IN MG/24 HOURS) 14 mg/24hr patch Place 1 patch onto the skin daily.      Marland Kitchen omeprazole (PRILOSEC) 20 MG capsule Take 20 mg by mouth daily.      . ramipril (ALTACE) 2.5 MG tablet Take 2.5 mg by mouth daily.      . simvastatin (ZOCOR) 20 MG tablet Take 20 mg  by mouth every evening.      . valsartan (DIOVAN) 80 MG tablet Take 80 mg by mouth 2 (two) times daily.      . niacin (NIASPAN) 1000 MG CR tablet Take 1,000 mg by mouth at bedtime.        Physical Examination  Filed Vitals:   12/14/11 1604  BP: 120/80  Pulse: 77  Resp: 16    Body mass index is 30.11 kg/(m^2).  General:  WDWN in NAD Gait: Normal HEENT: WNL Eyes: Pupils equal Pulmonary: normal non-labored breathing , without Rales, rhonchi,  wheezing Cardiac: RRR, without  Murmurs, rubs or gallops; No carotid bruits Abdomen: soft, NT, no masses Skin: no rashes, ulcers noted Vascular Exam/Pulses: Palpable but dampened lower extremity pulses bilaterally, no carotid bruits are heard  Extremities without ischemic changes, no Gangrene , no cellulitis; no open wounds;  Musculoskeletal: no muscle wasting or atrophy  Neurologic: A&O X 3; Appropriate Affect ; SENSATION: normal; MOTOR FUNCTION:  moving  all extremities equally. Speech is fluent/normal  Non-Invasive Vascular Imaging: ABIs today are 0.93 on the right, 0.91 triphasic on the left which is virtually unchanged from previous exam in December 2011  ASSESSMENT/PLAN: Asymptomatic patient status post left femoropopliteal bypass in 2005. The patient will followup in one year with repeat duplex. The patient's questions were encouraged and answered, she is in agreement with this plan.  Lauree Chandler ANP  Clinic M.D.: Early

## 2011-12-15 NOTE — Addendum Note (Signed)
Addended by: Sharee Pimple on: 12/15/2011 08:25 AM   Modules accepted: Orders

## 2011-12-23 DIAGNOSIS — Z23 Encounter for immunization: Secondary | ICD-10-CM | POA: Diagnosis not present

## 2012-03-28 DIAGNOSIS — H04129 Dry eye syndrome of unspecified lacrimal gland: Secondary | ICD-10-CM | POA: Diagnosis not present

## 2012-03-28 DIAGNOSIS — H2510 Age-related nuclear cataract, unspecified eye: Secondary | ICD-10-CM | POA: Diagnosis not present

## 2012-06-09 DIAGNOSIS — Z1231 Encounter for screening mammogram for malignant neoplasm of breast: Secondary | ICD-10-CM | POA: Diagnosis not present

## 2012-09-04 DIAGNOSIS — I10 Essential (primary) hypertension: Secondary | ICD-10-CM | POA: Diagnosis not present

## 2012-09-04 DIAGNOSIS — I739 Peripheral vascular disease, unspecified: Secondary | ICD-10-CM | POA: Diagnosis not present

## 2012-09-04 DIAGNOSIS — E785 Hyperlipidemia, unspecified: Secondary | ICD-10-CM | POA: Diagnosis not present

## 2012-09-11 DIAGNOSIS — Z23 Encounter for immunization: Secondary | ICD-10-CM | POA: Diagnosis not present

## 2012-09-11 DIAGNOSIS — I10 Essential (primary) hypertension: Secondary | ICD-10-CM | POA: Diagnosis not present

## 2012-09-11 DIAGNOSIS — D509 Iron deficiency anemia, unspecified: Secondary | ICD-10-CM | POA: Diagnosis not present

## 2012-09-11 DIAGNOSIS — Z1331 Encounter for screening for depression: Secondary | ICD-10-CM | POA: Diagnosis not present

## 2012-09-11 DIAGNOSIS — Z124 Encounter for screening for malignant neoplasm of cervix: Secondary | ICD-10-CM | POA: Diagnosis not present

## 2012-09-11 DIAGNOSIS — J449 Chronic obstructive pulmonary disease, unspecified: Secondary | ICD-10-CM | POA: Diagnosis not present

## 2012-09-11 DIAGNOSIS — Z0000 Encounter for general adult medical examination without abnormal findings: Secondary | ICD-10-CM | POA: Diagnosis not present

## 2012-09-11 DIAGNOSIS — Z6829 Body mass index (BMI) 29.0-29.9, adult: Secondary | ICD-10-CM | POA: Diagnosis not present

## 2012-09-11 DIAGNOSIS — E785 Hyperlipidemia, unspecified: Secondary | ICD-10-CM | POA: Diagnosis not present

## 2012-12-12 ENCOUNTER — Encounter: Payer: Self-pay | Admitting: Family

## 2012-12-13 ENCOUNTER — Ambulatory Visit (HOSPITAL_COMMUNITY)
Admission: RE | Admit: 2012-12-13 | Discharge: 2012-12-13 | Disposition: A | Discharge status: Home / Self Care | Payer: Medicare Other | Source: Ambulatory Visit | Attending: Neurosurgery | Admitting: Neurosurgery

## 2012-12-13 ENCOUNTER — Ambulatory Visit (INDEPENDENT_AMBULATORY_CARE_PROVIDER_SITE_OTHER)
Admission: RE | Admit: 2012-12-13 | Discharge: 2012-12-13 | Disposition: A | Discharge status: Home / Self Care | Payer: Medicare Other | Source: Ambulatory Visit | Attending: Neurosurgery | Admitting: Neurosurgery

## 2012-12-13 ENCOUNTER — Ambulatory Visit: Payer: Medicare Other | Admitting: Family

## 2012-12-13 DIAGNOSIS — Z48812 Encounter for surgical aftercare following surgery on the circulatory system: Secondary | ICD-10-CM | POA: Diagnosis not present

## 2012-12-13 DIAGNOSIS — I739 Peripheral vascular disease, unspecified: Secondary | ICD-10-CM | POA: Insufficient documentation

## 2012-12-13 DIAGNOSIS — Z23 Encounter for immunization: Secondary | ICD-10-CM | POA: Diagnosis not present

## 2012-12-14 ENCOUNTER — Other Ambulatory Visit: Payer: Self-pay | Admitting: Neurosurgery

## 2013-03-15 HISTORY — PX: CATARACT EXTRACTION, BILATERAL: SHX1313

## 2013-04-03 DIAGNOSIS — H2589 Other age-related cataract: Secondary | ICD-10-CM | POA: Diagnosis not present

## 2013-04-03 DIAGNOSIS — H04129 Dry eye syndrome of unspecified lacrimal gland: Secondary | ICD-10-CM | POA: Diagnosis not present

## 2013-04-03 DIAGNOSIS — H43819 Vitreous degeneration, unspecified eye: Secondary | ICD-10-CM | POA: Diagnosis not present

## 2013-04-03 DIAGNOSIS — H18519 Endothelial corneal dystrophy, unspecified eye: Secondary | ICD-10-CM | POA: Diagnosis not present

## 2013-06-18 DIAGNOSIS — H2510 Age-related nuclear cataract, unspecified eye: Secondary | ICD-10-CM | POA: Diagnosis not present

## 2013-06-18 DIAGNOSIS — H2589 Other age-related cataract: Secondary | ICD-10-CM | POA: Diagnosis not present

## 2013-07-06 DIAGNOSIS — H2589 Other age-related cataract: Secondary | ICD-10-CM | POA: Diagnosis not present

## 2013-07-09 DIAGNOSIS — H18519 Endothelial corneal dystrophy, unspecified eye: Secondary | ICD-10-CM | POA: Diagnosis not present

## 2013-07-09 DIAGNOSIS — H25049 Posterior subcapsular polar age-related cataract, unspecified eye: Secondary | ICD-10-CM | POA: Diagnosis not present

## 2013-07-09 DIAGNOSIS — H25019 Cortical age-related cataract, unspecified eye: Secondary | ICD-10-CM | POA: Diagnosis not present

## 2013-07-09 DIAGNOSIS — H2589 Other age-related cataract: Secondary | ICD-10-CM | POA: Diagnosis not present

## 2013-07-09 DIAGNOSIS — H2510 Age-related nuclear cataract, unspecified eye: Secondary | ICD-10-CM | POA: Diagnosis not present

## 2013-08-16 DIAGNOSIS — H43829 Vitreomacular adhesion, unspecified eye: Secondary | ICD-10-CM | POA: Diagnosis not present

## 2013-09-06 DIAGNOSIS — H35379 Puckering of macula, unspecified eye: Secondary | ICD-10-CM | POA: Diagnosis not present

## 2013-10-05 DIAGNOSIS — R82998 Other abnormal findings in urine: Secondary | ICD-10-CM | POA: Diagnosis not present

## 2013-10-05 DIAGNOSIS — I10 Essential (primary) hypertension: Secondary | ICD-10-CM | POA: Diagnosis not present

## 2013-10-05 DIAGNOSIS — E785 Hyperlipidemia, unspecified: Secondary | ICD-10-CM | POA: Diagnosis not present

## 2013-10-09 DIAGNOSIS — I739 Peripheral vascular disease, unspecified: Secondary | ICD-10-CM | POA: Diagnosis not present

## 2013-10-09 DIAGNOSIS — I10 Essential (primary) hypertension: Secondary | ICD-10-CM | POA: Diagnosis not present

## 2013-10-09 DIAGNOSIS — M1990 Unspecified osteoarthritis, unspecified site: Secondary | ICD-10-CM | POA: Diagnosis not present

## 2013-10-09 DIAGNOSIS — E785 Hyperlipidemia, unspecified: Secondary | ICD-10-CM | POA: Diagnosis not present

## 2013-10-09 DIAGNOSIS — I701 Atherosclerosis of renal artery: Secondary | ICD-10-CM | POA: Diagnosis not present

## 2013-10-09 DIAGNOSIS — N183 Chronic kidney disease, stage 3 unspecified: Secondary | ICD-10-CM | POA: Diagnosis not present

## 2013-10-09 DIAGNOSIS — R82998 Other abnormal findings in urine: Secondary | ICD-10-CM | POA: Diagnosis not present

## 2013-10-09 DIAGNOSIS — Z124 Encounter for screening for malignant neoplasm of cervix: Secondary | ICD-10-CM | POA: Diagnosis not present

## 2013-10-09 DIAGNOSIS — Z1331 Encounter for screening for depression: Secondary | ICD-10-CM | POA: Diagnosis not present

## 2013-10-09 DIAGNOSIS — Z0000 Encounter for general adult medical examination without abnormal findings: Secondary | ICD-10-CM | POA: Diagnosis not present

## 2013-10-09 DIAGNOSIS — J449 Chronic obstructive pulmonary disease, unspecified: Secondary | ICD-10-CM | POA: Diagnosis not present

## 2013-10-10 DIAGNOSIS — Z1212 Encounter for screening for malignant neoplasm of rectum: Secondary | ICD-10-CM | POA: Diagnosis not present

## 2013-10-23 DIAGNOSIS — R319 Hematuria, unspecified: Secondary | ICD-10-CM | POA: Diagnosis not present

## 2013-10-31 DIAGNOSIS — H0289 Other specified disorders of eyelid: Secondary | ICD-10-CM | POA: Diagnosis not present

## 2013-10-31 DIAGNOSIS — H18519 Endothelial corneal dystrophy, unspecified eye: Secondary | ICD-10-CM | POA: Diagnosis not present

## 2013-10-31 DIAGNOSIS — H04129 Dry eye syndrome of unspecified lacrimal gland: Secondary | ICD-10-CM | POA: Diagnosis not present

## 2013-10-31 DIAGNOSIS — H1045 Other chronic allergic conjunctivitis: Secondary | ICD-10-CM | POA: Diagnosis not present

## 2013-10-31 DIAGNOSIS — H11129 Conjunctival concretions, unspecified eye: Secondary | ICD-10-CM | POA: Diagnosis not present

## 2013-10-31 DIAGNOSIS — Z961 Presence of intraocular lens: Secondary | ICD-10-CM | POA: Diagnosis not present

## 2013-12-04 DIAGNOSIS — Z23 Encounter for immunization: Secondary | ICD-10-CM | POA: Diagnosis not present

## 2014-01-30 DIAGNOSIS — Z1231 Encounter for screening mammogram for malignant neoplasm of breast: Secondary | ICD-10-CM | POA: Diagnosis not present

## 2014-09-04 ENCOUNTER — Ambulatory Visit (INDEPENDENT_AMBULATORY_CARE_PROVIDER_SITE_OTHER): Payer: Medicare Other | Admitting: Podiatry

## 2014-09-04 ENCOUNTER — Encounter: Payer: Self-pay | Admitting: Podiatry

## 2014-09-04 VITALS — BP 142/62 | HR 72 | Resp 12

## 2014-09-04 DIAGNOSIS — L03031 Cellulitis of right toe: Secondary | ICD-10-CM

## 2014-09-04 DIAGNOSIS — L02611 Cutaneous abscess of right foot: Secondary | ICD-10-CM | POA: Diagnosis not present

## 2014-09-04 DIAGNOSIS — I739 Peripheral vascular disease, unspecified: Secondary | ICD-10-CM | POA: Diagnosis not present

## 2014-09-04 NOTE — Patient Instructions (Signed)
Apply Triple Antibiotic ointment and a Band-Aid daily to the right great toe nail area until a dry scab forms Avoid shoe rub If you develop any sudden pain, fever, swelling present to office or ER

## 2014-09-04 NOTE — Progress Notes (Signed)
   Subjective:    Patient ID: Stacy Erickson, female    DOB: [birth year], 79 y.o.   MRN: [id number]  HPI  N-SORE, BLEEDING, DRAINING L-RT FOOT GREAT TOENAIL D-2 WEEKS O-SUDDENLY C-BETTER A-PRESSURE T-PEROXIDE AND SOAK WITH VINEGAR  He says that right hallux nail lifting off and forcing the distal nail to slough in the toe seemed to get better  Review of Systems  Musculoskeletal: Positive for myalgias and joint swelling.  Skin: Positive for color change.  Allergic/Immunologic: Positive for environmental allergies.  Hematological: Bruises/bleeds easily.   Patient has known history of peripheral arterial disease as ongoing evaluation for problem    Objective:   Physical Exam  Orientated 3  Vascular: No peripheral edema bilaterally DP right 0/4 and DP left 2/4 PT pulses trace palpable bilaterally  Neurological: Sensation to 10 g monofilament wire intact 5/5 bilaterally Vibratory sensation intact bilaterally Ankle reflexes were equal reactive bilaterally  Dermatological: The distal right hallux has a V-shaped and the distal edge is lifting off the nailbed There is a granular area on the distal aspect of the nail that after debridement reveals a 2 millimeter granular area without any surrounding erythema, edema, warmth  Musculoskeletal: HAV deformities bilaterally Bunionette's bilaterally      Assessment & Plan:   Assessment: Peripheral arterial disease Subungual sterile abscess, right hallux  Plan: General debridement without any bleeding on right hallux uncovers the remaining granular area, 2 mm in diameter. An anti-biotic dressing applied. Patient was advised to continue applying topical anabiotic ointment to this area until the area healed  If she develops any sudden pain, swelling, redness present to ER

## 2014-10-31 DIAGNOSIS — E785 Hyperlipidemia, unspecified: Secondary | ICD-10-CM | POA: Diagnosis not present

## 2014-10-31 DIAGNOSIS — N183 Chronic kidney disease, stage 3 (moderate): Secondary | ICD-10-CM | POA: Diagnosis not present

## 2014-10-31 DIAGNOSIS — I739 Peripheral vascular disease, unspecified: Secondary | ICD-10-CM | POA: Diagnosis not present

## 2014-10-31 DIAGNOSIS — R8299 Other abnormal findings in urine: Secondary | ICD-10-CM | POA: Diagnosis not present

## 2014-11-07 DIAGNOSIS — G2581 Restless legs syndrome: Secondary | ICD-10-CM | POA: Diagnosis not present

## 2014-11-07 DIAGNOSIS — E785 Hyperlipidemia, unspecified: Secondary | ICD-10-CM | POA: Diagnosis not present

## 2014-11-07 DIAGNOSIS — I739 Peripheral vascular disease, unspecified: Secondary | ICD-10-CM | POA: Diagnosis not present

## 2014-11-07 DIAGNOSIS — Z0000 Encounter for general adult medical examination without abnormal findings: Secondary | ICD-10-CM | POA: Diagnosis not present

## 2014-11-07 DIAGNOSIS — I701 Atherosclerosis of renal artery: Secondary | ICD-10-CM | POA: Diagnosis not present

## 2014-11-07 DIAGNOSIS — Z6829 Body mass index (BMI) 29.0-29.9, adult: Secondary | ICD-10-CM | POA: Diagnosis not present

## 2014-11-07 DIAGNOSIS — Z1231 Encounter for screening mammogram for malignant neoplasm of breast: Secondary | ICD-10-CM | POA: Diagnosis not present

## 2014-11-07 DIAGNOSIS — J449 Chronic obstructive pulmonary disease, unspecified: Secondary | ICD-10-CM | POA: Diagnosis not present

## 2014-11-07 DIAGNOSIS — E875 Hyperkalemia: Secondary | ICD-10-CM | POA: Diagnosis not present

## 2014-11-07 DIAGNOSIS — N183 Chronic kidney disease, stage 3 (moderate): Secondary | ICD-10-CM | POA: Diagnosis not present

## 2014-11-07 DIAGNOSIS — I10 Essential (primary) hypertension: Secondary | ICD-10-CM | POA: Diagnosis not present

## 2014-11-07 DIAGNOSIS — D509 Iron deficiency anemia, unspecified: Secondary | ICD-10-CM | POA: Diagnosis not present

## 2014-11-13 ENCOUNTER — Other Ambulatory Visit: Payer: Self-pay

## 2014-11-13 DIAGNOSIS — I739 Peripheral vascular disease, unspecified: Secondary | ICD-10-CM

## 2014-11-13 DIAGNOSIS — I701 Atherosclerosis of renal artery: Secondary | ICD-10-CM

## 2014-11-14 ENCOUNTER — Encounter: Payer: Self-pay | Admitting: Gastroenterology

## 2014-11-15 ENCOUNTER — Other Ambulatory Visit: Payer: Self-pay

## 2014-11-19 ENCOUNTER — Other Ambulatory Visit: Payer: Self-pay

## 2014-11-19 DIAGNOSIS — I739 Peripheral vascular disease, unspecified: Secondary | ICD-10-CM

## 2014-11-28 DIAGNOSIS — H10413 Chronic giant papillary conjunctivitis, bilateral: Secondary | ICD-10-CM | POA: Diagnosis not present

## 2014-11-28 DIAGNOSIS — H26493 Other secondary cataract, bilateral: Secondary | ICD-10-CM | POA: Diagnosis not present

## 2014-11-28 DIAGNOSIS — Z961 Presence of intraocular lens: Secondary | ICD-10-CM | POA: Diagnosis not present

## 2014-11-28 DIAGNOSIS — H0289 Other specified disorders of eyelid: Secondary | ICD-10-CM | POA: Diagnosis not present

## 2014-11-28 DIAGNOSIS — H1851 Endothelial corneal dystrophy: Secondary | ICD-10-CM | POA: Diagnosis not present

## 2014-11-28 DIAGNOSIS — H04123 Dry eye syndrome of bilateral lacrimal glands: Secondary | ICD-10-CM | POA: Diagnosis not present

## 2014-11-29 DIAGNOSIS — Z23 Encounter for immunization: Secondary | ICD-10-CM | POA: Diagnosis not present

## 2014-12-04 DIAGNOSIS — H26491 Other secondary cataract, right eye: Secondary | ICD-10-CM | POA: Diagnosis not present

## 2014-12-27 ENCOUNTER — Other Ambulatory Visit (HOSPITAL_COMMUNITY): Payer: PRIVATE HEALTH INSURANCE

## 2014-12-27 ENCOUNTER — Encounter (HOSPITAL_COMMUNITY): Payer: PRIVATE HEALTH INSURANCE

## 2014-12-31 ENCOUNTER — Ambulatory Visit: Payer: PRIVATE HEALTH INSURANCE | Admitting: Vascular Surgery

## 2015-01-15 ENCOUNTER — Ambulatory Visit (AMBULATORY_SURGERY_CENTER): Payer: Self-pay | Admitting: *Deleted

## 2015-01-15 VITALS — Ht 63.0 in | Wt 168.0 lb

## 2015-01-15 DIAGNOSIS — Z1211 Encounter for screening for malignant neoplasm of colon: Secondary | ICD-10-CM

## 2015-01-15 MED ORDER — NA SULFATE-K SULFATE-MG SULF 17.5-3.13-1.6 GM/177ML PO SOLN: ORAL | Status: DC

## 2015-01-15 NOTE — Progress Notes (Signed)
Patient denies any allergies to eggs or soy. Patient had elevated blood pressure with both cataract surgeries. Patient denies any oxygen use at home and does not take any diet/weight loss medications. EMMI education assisgned to patient on colonoscopy, this was explained and instructions given to patient.

## 2015-01-17 ENCOUNTER — Encounter: Payer: Self-pay | Admitting: Vascular Surgery

## 2015-01-17 ENCOUNTER — Ambulatory Visit (INDEPENDENT_AMBULATORY_CARE_PROVIDER_SITE_OTHER)
Admission: RE | Admit: 2015-01-17 | Discharge: 2015-01-17 | Disposition: A | Discharge status: Home / Self Care | Payer: Medicare Other | Source: Ambulatory Visit | Attending: Vascular Surgery | Admitting: Vascular Surgery

## 2015-01-17 ENCOUNTER — Ambulatory Visit (HOSPITAL_COMMUNITY)
Admission: RE | Admit: 2015-01-17 | Discharge: 2015-01-17 | Disposition: A | Discharge status: Home / Self Care | Payer: Medicare Other | Source: Ambulatory Visit | Attending: Vascular Surgery | Admitting: Vascular Surgery

## 2015-01-17 DIAGNOSIS — I739 Peripheral vascular disease, unspecified: Secondary | ICD-10-CM

## 2015-01-17 DIAGNOSIS — I701 Atherosclerosis of renal artery: Secondary | ICD-10-CM | POA: Insufficient documentation

## 2015-01-21 ENCOUNTER — Ambulatory Visit (INDEPENDENT_AMBULATORY_CARE_PROVIDER_SITE_OTHER): Payer: Medicare Other | Admitting: Vascular Surgery

## 2015-01-21 ENCOUNTER — Encounter: Payer: Self-pay | Admitting: Vascular Surgery

## 2015-01-21 VITALS — BP 145/65 | HR 83 | Temp 98.0°F | Resp 16 | Ht 62.5 in | Wt 166.0 lb

## 2015-01-21 DIAGNOSIS — E538 Deficiency of other specified B group vitamins: Secondary | ICD-10-CM | POA: Diagnosis not present

## 2015-01-21 DIAGNOSIS — D509 Iron deficiency anemia, unspecified: Secondary | ICD-10-CM | POA: Diagnosis not present

## 2015-01-21 DIAGNOSIS — I701 Atherosclerosis of renal artery: Secondary | ICD-10-CM | POA: Diagnosis not present

## 2015-01-21 DIAGNOSIS — I739 Peripheral vascular disease, unspecified: Secondary | ICD-10-CM | POA: Diagnosis not present

## 2015-01-21 NOTE — Progress Notes (Signed)
Filed Vitals:   01/21/15 1527 01/21/15 1533  BP: 163/70 145/65  Pulse: 88 83  Temp: 98 F (36.7 C)   TempSrc: Oral   Resp: 16   Height: 5' 2.5" (1.588 m)   Weight: 166 lb (75.297 kg)   SpO2: 97%

## 2015-01-21 NOTE — Addendum Note (Signed)
Addended by: Dorthula Rue L on: 01/21/2015 04:43 PM   Modules accepted: Orders

## 2015-01-21 NOTE — Progress Notes (Signed)
Subjective:     Patient ID: Stacy Erickson, female   DOB: 03-Nov-1935, 79 y.o.   MRN: [id number]  HPI this 79 year old female is referred by Dr. Joylene Draft to evaluate functioning left femoral popliteal bypass graft and possible renal artery stenosis. This patient is known to me having undergone a saphenous vein graft in the left leg-femoral-popliteal-in 2005. She denies any claudication symptoms. At that time she was noted to have a right renal artery occlusion. As recently as 2012 duplex scan of her left kidney and renal artery were normal. Renal function was checked in Dr. Silvestre Mesi office in July 2016 and creatinine equal 1.0 and BUN equal 18.. She denies any worsening hypertension. At one point she was followed by Dr. Clair Gulling [place] has not been followed by him for the past 3-4 years.  Past Medical History  Diagnosis Date  . Hyperlipidemia   . Hypertension   . Renal artery occlusion [facility])     Social History  Substance Use Topics  . Smoking status: Former Smoker -- 2.00 packs/day for 35 years    Types: Cigarettes    Quit date: 10/13/2001  . Smokeless tobacco: Never Used     Comment: nicotine patch uses.  . Alcohol Use: 8.4 oz/week    14 Cans of beer per week    Family History  Problem Relation Age of Onset  . Heart disease Father   . Cancer Sister   . Colon cancer Other 1  . Colon polyps Child   . Colon polyps Child     Allergies  Allergen Reactions  . Codeine Nausea And Vomiting  . Sulfa Antibiotics Itching and Nausea Only     Current outpatient prescriptions:  .  aspirin 81 MG tablet, Take 81 mg by mouth daily., Disp: , Rfl:  .  Cholecalciferol (VITAMIN D3) 1000 UNITS CAPS, Take 1,000 Units by mouth daily., Disp: , Rfl:  .  estradiol (CLIMARA - DOSED IN MG/24 HR) 0.025 mg/24hr, Place 1 patch onto the skin once a week., Disp: , Rfl:  .  furosemide (LASIX) 40 MG tablet, Take 40 mg by mouth daily., Disp: , Rfl:  .  loratadine (CLARITIN) 10 MG tablet, Take 10 mg by mouth daily.,  Disp: , Rfl:  .  LORazepam (ATIVAN) 0.5 MG tablet, Take 0.5 mg by mouth at bedtime., Disp: , Rfl:  .  Multiple Vitamin (MULTIVITAMIN) capsule, Take 1 capsule by mouth daily., Disp: , Rfl:  .  Na Sulfate-K Sulfate-Mg Sulf SOLN, Suprep (no substitutions)-TAKE AS DIRECTED., Disp: 354 mL, Rfl: 0 .  nicotine (NICODERM CQ - DOSED IN MG/24 HOURS) 14 mg/24hr patch, Place 1 patch onto the skin daily., Disp: , Rfl:  .  omeprazole (PRILOSEC) 20 MG capsule, Take 20 mg by mouth daily., Disp: , Rfl:  .  ramipril (ALTACE) 2.5 MG tablet, Take 2.5 mg by mouth daily., Disp: , Rfl:  .  simvastatin (ZOCOR) 20 MG tablet, Take 20 mg by mouth every evening., Disp: , Rfl:  .  valsartan (DIOVAN) 40 MG tablet, 2 (two) times daily., Disp: , Rfl:  .  niacin (NIASPAN) 1000 MG CR tablet, Take 1,000 mg by mouth at bedtime., Disp: , Rfl:  .  valsartan (DIOVAN) 80 MG tablet, Take 80 mg by mouth 2 (two) times daily., Disp: , Rfl:   Filed Vitals:   01/21/15 1527 01/21/15 1533  BP: 163/70 145/65  Pulse: 88 83  Temp: 98 F (36.7 C)   TempSrc: Oral   Resp: 16   Height: 5' 2.5" (  1.588 m)   Weight: 166 lb (75.297 kg)   SpO2: 97%     Body mass index is 29.86 kg/(m^2).           Review of Systems denies chest pain, dyspnea on exertion, PND, orthopnea, hemoptysis, does complain of left hip discomfort on occasion. Has history of right renal artery occlusion other systems negative and complete review of systems,    Objective:   Physical Exam Gen.-alert and oriented x3 in no apparent distress HEENT normal for age Lungs no rhonchi or wheezing Cardiovascular regular rhythm no murmurs carotid pulses 3+ palpable no bruits audible Abdomen soft nontender no palpable masses Musculoskeletal free of  major deformities Skin clear -no rashes Neurologic normal Lower extremities 3+ femoral and dorsalis pedis pulses palpable bilaterally with no edema  Duplex scan was ordered and reviewed by me. Her left femoral-popliteal graft  is widely patent with an ABI of 0.95 in the right leg has an ABI 1.0.  Patient had a recent renal scan which revealed significant elevation of velocity at the origin suggesting a possible 75-95% renal artery stenosis. Right renal artery is known to be chronically occluded. Creatinine 1.0 BUN 18 in August this year       Assessment:     #1 widely patent left femoral popliteal bypass graft with no claudication #2 history of right renal artery occlusion with evidence of significant left renal artery stenosis. Left kidney is normal size. Renal function is normal with creatinine of 1.0    Plan:     We'll get CTA of left renal artery to rule out severe stenosis. Unlikely that any intervention will need to be performed since she has been stable from the standpoint of her renal function but with only one functional kidney feel that we should further evaluate this

## 2015-01-29 ENCOUNTER — Encounter: Payer: Self-pay | Admitting: Gastroenterology

## 2015-01-29 ENCOUNTER — Ambulatory Visit (AMBULATORY_SURGERY_CENTER): Payer: Medicare Other | Admitting: Gastroenterology

## 2015-01-29 VITALS — BP 180/97 | HR 76 | Temp 97.9°F | Resp 19 | Ht 63.0 in | Wt 168.0 lb

## 2015-01-29 DIAGNOSIS — Z8371 Family history of colonic polyps: Secondary | ICD-10-CM | POA: Diagnosis not present

## 2015-01-29 DIAGNOSIS — I10 Essential (primary) hypertension: Secondary | ICD-10-CM | POA: Diagnosis not present

## 2015-01-29 DIAGNOSIS — Z1211 Encounter for screening for malignant neoplasm of colon: Secondary | ICD-10-CM | POA: Diagnosis not present

## 2015-01-29 MED ORDER — SODIUM CHLORIDE 0.9 % IV SOLN: 500.0000 mL | INTRAVENOUS | Status: DC

## 2015-01-29 NOTE — Patient Instructions (Signed)
Discharge instructions given. Normal exam. Resume previous medications. YOU HAD AN ENDOSCOPIC PROCEDURE TODAY AT THE [facility]:   Refer to the procedure report that was given to you for any specific questions about what was found during the examination.  If the procedure report does not answer your questions, please call your gastroenterologist to clarify.  If you requested that your care partner not be given the details of your procedure findings, then the procedure report has been included in a sealed envelope for you to review at your convenience later.  YOU SHOULD EXPECT: Some feelings of bloating in the abdomen. Passage of more gas than usual.  Walking can help get rid of the air that was put into your GI tract during the procedure and reduce the bloating. If you had a lower endoscopy (such as a colonoscopy or flexible sigmoidoscopy) you may notice spotting of blood in your stool or on the toilet paper. If you underwent a bowel prep for your procedure, you may not have a normal bowel movement for a few days.  Please Note:  You might notice some irritation and congestion in your nose or some drainage.  This is from the oxygen used during your procedure.  There is no need for concern and it should clear up in a day or so.  SYMPTOMS TO REPORT IMMEDIATELY:   Following lower endoscopy (colonoscopy or flexible sigmoidoscopy):  Excessive amounts of blood in the stool  Significant tenderness or worsening of abdominal pains  Swelling of the abdomen that is new, acute  Fever of 100F or higher   For urgent or emergent issues, a gastroenterologist can be reached at any hour by calling [phone].   DIET: Your first meal following the procedure should be a small meal and then it is ok to progress to your normal diet. Heavy or fried foods are harder to digest and may make you feel nauseous or bloated.  Likewise, meals heavy in dairy and vegetables can increase bloating.  Drink plenty  of fluids but you should avoid alcoholic beverages for 24 hours.  ACTIVITY:  You should plan to take it easy for the rest of today and you should NOT DRIVE or use heavy machinery until tomorrow (because of the sedation medicines used during the test).    FOLLOW UP: Our staff will call the number listed on your records the next business day following your procedure to check on you and address any questions or concerns that you may have regarding the information given to you following your procedure. If we do not reach you, we will leave a message.  However, if you are feeling well and you are not experiencing any problems, there is no need to return our call.  We will assume that you have returned to your regular daily activities without incident.  If any biopsies were taken you will be contacted by phone or by letter within the next 1-3 weeks.  Please call us at [phone] if you have not heard about the biopsies in 3 weeks.    SIGNATURES/CONFIDENTIALITY: You and/or your care partner have signed paperwork which will be entered into your electronic medical record.  These signatures attest to the fact that that the information above on your After Visit Summary has been reviewed and is understood.  Full responsibility of the confidentiality of this discharge information lies with you and/or your care-partner. 

## 2015-01-29 NOTE — Progress Notes (Signed)
A/ox3 pleased with MAC, report to Celia RN 

## 2015-01-29 NOTE — Op Note (Signed)
[place]  [address] [place], [address]   COLONOSCOPY PROCEDURE REPORT  PATIENT: Stacy Erickson, Stacy Erickson  MR#: [id number] BIRTHDATE: 1935/05/27 , 89  yrs. old GENDER: female ENDOSCOPIST: Ladene Artist, MD, [facility] REFERRED BY: Crist Infante, M.D. PROCEDURE DATE:  01/29/2015 PROCEDURE:   Colonoscopy, screening First Screening Colonoscopy - Avg.  risk and is 50 yrs.  old or older - No.  Prior Negative Screening - Now for repeat screening. 10 or more years since last screening  History of Adenoma - Now for follow-up colonoscopy & has been > or = to 3 yrs.  N/A  Polyps removed today? No Recommend repeat exam, <10 yrs? No ASA CLASS:   Class II INDICATIONS:Screening for colonic neoplasia and FH Colon Adenoma. MEDICATIONS: Monitored anesthesia care and Propofol 200 mg IV DESCRIPTION OF PROCEDURE:   After the risks benefits and alternatives of the procedure were thoroughly explained, informed consent was obtained.  The digital rectal exam revealed no abnormalities of the rectum.   The LB 1528  endoscope was introduced through the anus and advanced to the cecum, which was identified by both the appendix and ileocecal valve. No adverse events experienced.   The quality of the prep was excellent. (Suprep was used)  The instrument was then slowly withdrawn as the colon was fully examined. Estimated blood loss is zero unless otherwise noted in this procedure report.    COLON FINDINGS: A normal appearing cecum, ileocecal valve, and appendiceal orifice were identified.  The ascending, transverse, descending, sigmoid colon, and rectum appeared unremarkable. Retroflexed views revealed internal Grade I hemorrhoids. The time to cecum = 3.9 Withdrawal time = 9.6   The scope was withdrawn and the procedure completed. COMPLICATIONS: There were no immediate complications.  ENDOSCOPIC IMPRESSION: Normal colonoscopy  RECOMMENDATIONS: Given your age, you will not need another colonoscopy  for colon cancer screening or polyp surveillance.  These types of tests usually stop around the age 15.  eSigned:  Ladene Artist, MD, [facility] 01/29/2015 10:41 AM

## 2015-01-30 ENCOUNTER — Other Ambulatory Visit: Payer: Medicare Other

## 2015-01-30 ENCOUNTER — Telehealth: Payer: Self-pay | Admitting: Emergency Medicine

## 2015-01-30 DIAGNOSIS — E538 Deficiency of other specified B group vitamins: Secondary | ICD-10-CM | POA: Diagnosis not present

## 2015-01-30 NOTE — Telephone Encounter (Signed)
  Follow up Call-  Call back number 01/29/2015  Post procedure Call Back phone  # [phone]  Permission to leave phone message Yes     Patient questions:  Do you have a fever, pain , or abdominal swelling? No. Pain Score  0 *  Have you tolerated food without any problems? Yes.    Have you been able to return to your normal activities? Yes.    Do you have any questions about your discharge instructions: Diet   No. Medications  No. Follow up visit  No.  Do you have questions or concerns about your Care? No.  Actions: * If pain score is 4 or above: No action needed, pain <4.

## 2015-02-03 ENCOUNTER — Other Ambulatory Visit: Payer: Self-pay | Admitting: Vascular Surgery

## 2015-02-03 ENCOUNTER — Ambulatory Visit
Admission: RE | Admit: 2015-02-03 | Discharge: 2015-02-03 | Disposition: A | Discharge status: Home / Self Care | Payer: Medicare Other | Source: Ambulatory Visit | Attending: Vascular Surgery | Admitting: Vascular Surgery

## 2015-02-03 DIAGNOSIS — I701 Atherosclerosis of renal artery: Secondary | ICD-10-CM | POA: Diagnosis not present

## 2015-02-03 DIAGNOSIS — I739 Peripheral vascular disease, unspecified: Secondary | ICD-10-CM

## 2015-02-03 MED ORDER — IOPAMIDOL (ISOVUE-370) INJECTION 76%
50.0000 mL | Freq: Once | INTRAVENOUS | Status: AC | PRN
  Administered 2015-02-03: 50 mL via INTRAVENOUS

## 2015-02-04 ENCOUNTER — Encounter: Payer: Self-pay | Admitting: Vascular Surgery

## 2015-02-11 ENCOUNTER — Encounter: Payer: Self-pay | Admitting: Vascular Surgery

## 2015-02-11 ENCOUNTER — Ambulatory Visit (INDEPENDENT_AMBULATORY_CARE_PROVIDER_SITE_OTHER): Payer: Medicare Other | Admitting: Vascular Surgery

## 2015-02-11 VITALS — BP 145/76 | HR 89 | Temp 97.5°F | Resp 16 | Ht 62.5 in | Wt 168.0 lb

## 2015-02-11 DIAGNOSIS — I739 Peripheral vascular disease, unspecified: Secondary | ICD-10-CM | POA: Diagnosis not present

## 2015-02-11 DIAGNOSIS — I701 Atherosclerosis of renal artery: Secondary | ICD-10-CM

## 2015-02-11 NOTE — Addendum Note (Signed)
Addended by: Dorthula Rue L on: 02/11/2015 05:20 PM   Modules accepted: Orders

## 2015-02-11 NOTE — Progress Notes (Signed)
Subjective:     Patient ID: Stacy Erickson, female   DOB: 10/29/1935, 79 y.o.   MRN: [id number]  HPI This 79 year old female returns today to discuss the findings of her CT angiogram. She had evidence of possible severe left renal artery stenosis on  Recent renal duplex scan and has known right renal artery occlusion for many years. She does have good renal function with a recent creatinine of 1.0 and has well-controlled hypertension. CT angiogram was performed and the results were reviewed by me and patient does have calcification at the ostium of the left renal artery but it does not appear to be significantly stenotic at this time area patient also has a functioning left femoral popliteal bypass graft which was placed in 2005 by me and is functioning well.   Past Medical History  Diagnosis Date  . Hyperlipidemia   . Hypertension   . Renal artery occlusion [facility])     Social History  Substance Use Topics  . Smoking status: Former Smoker -- 2.00 packs/day for 35 years    Types: Cigarettes    Quit date: 10/13/2001  . Smokeless tobacco: Never Used     Comment: nicotine patch uses.  . Alcohol Use: 8.4 oz/week    14 Cans of beer per week    Family History  Problem Relation Age of Onset  . Heart disease Father   . Cancer Sister   . Colon cancer Other 43  . Colon polyps Child   . Colon polyps Child     Allergies  Allergen Reactions  . Codeine Nausea And Vomiting  . Sulfa Antibiotics Itching and Nausea Only     Current outpatient prescriptions:  .  aspirin 81 MG tablet, Take 81 mg by mouth daily., Disp: , Rfl:  .  Cholecalciferol (VITAMIN D3) 1000 UNITS CAPS, Take 1,000 Units by mouth daily., Disp: , Rfl:  .  Cyanocobalamin (B-12) 1000 MCG/ML KIT, Inject as directed every 30 (thirty) days., Disp: , Rfl:  .  estradiol (CLIMARA - DOSED IN MG/24 HR) 0.025 mg/24hr, Place 1 patch onto the skin once a week., Disp: , Rfl:  .  furosemide (LASIX) 40 MG tablet, Take 40 mg by mouth daily., Disp:  , Rfl:  .  loratadine (CLARITIN) 10 MG tablet, Take 10 mg by mouth daily., Disp: , Rfl:  .  LORazepam (ATIVAN) 0.5 MG tablet, Take 0.5 mg by mouth at bedtime., Disp: , Rfl:  .  Multiple Vitamin (MULTIVITAMIN) capsule, Take 1 capsule by mouth daily., Disp: , Rfl:  .  nicotine (NICODERM CQ - DOSED IN MG/24 HOURS) 14 mg/24hr patch, Place 1 patch onto the skin daily., Disp: , Rfl:  .  omeprazole (PRILOSEC) 20 MG capsule, Take 20 mg by mouth daily., Disp: , Rfl:  .  simvastatin (ZOCOR) 20 MG tablet, Take 20 mg by mouth every evening., Disp: , Rfl:  .  valsartan (DIOVAN) 40 MG tablet, 2 (two) times daily., Disp: , Rfl:  .  vitamin B-12 (CYANOCOBALAMIN) 1000 MCG tablet, Take 1,000 mcg by mouth daily., Disp: , Rfl:   Filed Vitals:   02/11/15 1531 02/11/15 1537  BP: 148/82 145/76  Pulse: 91 89  Temp: 97.5 F (36.4 C)   TempSrc: Oral   Resp: 16   Height: 5' 2.5" (1.588 m)   Weight: 168 lb (76.204 kg)   SpO2: 98%     Body mass index is 30.22 kg/(m^2).           Review of Systems Denies chest pain,  dyspnea on exertion, PND, orthopnea, hemoptysis     Objective:   Physical Exam BP 145/76 mmHg  Pulse 89  Temp(Src) 97.5 F (36.4 C) (Oral)  Resp 16  Ht 5' 2.5" (1.588 m)  Wt 168 lb (76.204 kg)  BMI 30.22 kg/m2  SpO2 98%   Gen. Well-developed well-nourished female in no apparent distress alert and oriented 3 Lungs no rhonchi or wheezing  Cardiovascular regular rhythm no murmurs    today I reviewed the CT angiogram of the abdomen and also read the report from the radiologist which describes calcification at the ostium of the left renal artery but no severe stenosis.     Assessment:      widely functioning left femoral popliteal saphenous vein graft  mild stenosis origin left renal artery with known right renal artery occlusion  well preserved renal function and well-controlled hypertension     Plan:      return in 1 year with duplex scan left femoral-popliteal bypass and  check ABIs as well as duplex of left renal artery. Elevated velocity at the origin of left renal artery will be again examined and compared to previous study  patient will see nurse practitioner

## 2015-02-11 NOTE — Progress Notes (Signed)
Filed Vitals:   02/11/15 1531 02/11/15 1537  BP: 148/82 145/76  Pulse: 91 89  Temp: 97.5 F (36.4 C)   TempSrc: Oral   Resp: 16   Height: 5' 2.5" (1.588 m)   Weight: 168 lb (76.204 kg)   SpO2: 98%

## 2015-03-04 DIAGNOSIS — E538 Deficiency of other specified B group vitamins: Secondary | ICD-10-CM | POA: Diagnosis not present

## 2015-04-03 DIAGNOSIS — E538 Deficiency of other specified B group vitamins: Secondary | ICD-10-CM | POA: Diagnosis not present

## 2015-04-30 DIAGNOSIS — E538 Deficiency of other specified B group vitamins: Secondary | ICD-10-CM | POA: Diagnosis not present

## 2015-05-28 DIAGNOSIS — E538 Deficiency of other specified B group vitamins: Secondary | ICD-10-CM | POA: Diagnosis not present

## 2015-06-24 DIAGNOSIS — E538 Deficiency of other specified B group vitamins: Secondary | ICD-10-CM | POA: Diagnosis not present

## 2015-07-24 DIAGNOSIS — E538 Deficiency of other specified B group vitamins: Secondary | ICD-10-CM | POA: Diagnosis not present

## 2015-08-21 DIAGNOSIS — E538 Deficiency of other specified B group vitamins: Secondary | ICD-10-CM | POA: Diagnosis not present

## 2015-09-18 DIAGNOSIS — E538 Deficiency of other specified B group vitamins: Secondary | ICD-10-CM | POA: Diagnosis not present

## 2015-10-21 DIAGNOSIS — E538 Deficiency of other specified B group vitamins: Secondary | ICD-10-CM | POA: Diagnosis not present

## 2015-11-21 DIAGNOSIS — N183 Chronic kidney disease, stage 3 (moderate): Secondary | ICD-10-CM | POA: Diagnosis not present

## 2015-11-21 DIAGNOSIS — E538 Deficiency of other specified B group vitamins: Secondary | ICD-10-CM | POA: Diagnosis not present

## 2015-11-21 DIAGNOSIS — E784 Other hyperlipidemia: Secondary | ICD-10-CM | POA: Diagnosis not present

## 2015-11-26 DIAGNOSIS — E784 Other hyperlipidemia: Secondary | ICD-10-CM | POA: Diagnosis not present

## 2015-11-26 DIAGNOSIS — I7389 Other specified peripheral vascular diseases: Secondary | ICD-10-CM | POA: Diagnosis not present

## 2015-11-26 DIAGNOSIS — E538 Deficiency of other specified B group vitamins: Secondary | ICD-10-CM | POA: Diagnosis not present

## 2015-11-26 DIAGNOSIS — Z1231 Encounter for screening mammogram for malignant neoplasm of breast: Secondary | ICD-10-CM | POA: Diagnosis not present

## 2015-11-26 DIAGNOSIS — Z23 Encounter for immunization: Secondary | ICD-10-CM | POA: Diagnosis not present

## 2015-11-26 DIAGNOSIS — Z6830 Body mass index (BMI) 30.0-30.9, adult: Secondary | ICD-10-CM | POA: Diagnosis not present

## 2015-11-26 DIAGNOSIS — I10 Essential (primary) hypertension: Secondary | ICD-10-CM | POA: Diagnosis not present

## 2015-11-26 DIAGNOSIS — D692 Other nonthrombocytopenic purpura: Secondary | ICD-10-CM | POA: Diagnosis not present

## 2015-11-26 DIAGNOSIS — I701 Atherosclerosis of renal artery: Secondary | ICD-10-CM | POA: Diagnosis not present

## 2015-11-26 DIAGNOSIS — Z0000 Encounter for general adult medical examination without abnormal findings: Secondary | ICD-10-CM | POA: Diagnosis not present

## 2015-11-26 DIAGNOSIS — N183 Chronic kidney disease, stage 3 (moderate): Secondary | ICD-10-CM | POA: Diagnosis not present

## 2015-11-26 DIAGNOSIS — J449 Chronic obstructive pulmonary disease, unspecified: Secondary | ICD-10-CM | POA: Diagnosis not present

## 2015-11-26 DIAGNOSIS — Z1389 Encounter for screening for other disorder: Secondary | ICD-10-CM | POA: Diagnosis not present

## 2015-12-09 DIAGNOSIS — H10413 Chronic giant papillary conjunctivitis, bilateral: Secondary | ICD-10-CM | POA: Diagnosis not present

## 2015-12-09 DIAGNOSIS — Z961 Presence of intraocular lens: Secondary | ICD-10-CM | POA: Diagnosis not present

## 2015-12-09 DIAGNOSIS — H1851 Endothelial corneal dystrophy: Secondary | ICD-10-CM | POA: Diagnosis not present

## 2015-12-09 DIAGNOSIS — H43813 Vitreous degeneration, bilateral: Secondary | ICD-10-CM | POA: Diagnosis not present

## 2015-12-09 DIAGNOSIS — H04123 Dry eye syndrome of bilateral lacrimal glands: Secondary | ICD-10-CM | POA: Diagnosis not present

## 2015-12-09 DIAGNOSIS — H02834 Dermatochalasis of left upper eyelid: Secondary | ICD-10-CM | POA: Diagnosis not present

## 2015-12-09 DIAGNOSIS — H02831 Dermatochalasis of right upper eyelid: Secondary | ICD-10-CM | POA: Diagnosis not present

## 2015-12-09 DIAGNOSIS — H0289 Other specified disorders of eyelid: Secondary | ICD-10-CM | POA: Diagnosis not present

## 2015-12-18 DIAGNOSIS — N183 Chronic kidney disease, stage 3 (moderate): Secondary | ICD-10-CM | POA: Diagnosis not present

## 2015-12-18 DIAGNOSIS — Z780 Asymptomatic menopausal state: Secondary | ICD-10-CM | POA: Diagnosis not present

## 2015-12-24 DIAGNOSIS — D508 Other iron deficiency anemias: Secondary | ICD-10-CM | POA: Diagnosis not present

## 2015-12-24 DIAGNOSIS — E875 Hyperkalemia: Secondary | ICD-10-CM | POA: Diagnosis not present

## 2015-12-24 DIAGNOSIS — E538 Deficiency of other specified B group vitamins: Secondary | ICD-10-CM | POA: Diagnosis not present

## 2016-01-27 DIAGNOSIS — E538 Deficiency of other specified B group vitamins: Secondary | ICD-10-CM | POA: Diagnosis not present

## 2016-02-11 ENCOUNTER — Encounter: Payer: Self-pay | Admitting: Family

## 2016-02-17 ENCOUNTER — Ambulatory Visit (INDEPENDENT_AMBULATORY_CARE_PROVIDER_SITE_OTHER)
Admission: RE | Admit: 2016-02-17 | Discharge: 2016-02-17 | Disposition: A | Discharge status: Home / Self Care | Payer: Medicare Other | Source: Ambulatory Visit | Attending: Vascular Surgery | Admitting: Vascular Surgery

## 2016-02-17 ENCOUNTER — Ambulatory Visit (INDEPENDENT_AMBULATORY_CARE_PROVIDER_SITE_OTHER): Payer: Medicare Other | Admitting: Family

## 2016-02-17 ENCOUNTER — Encounter: Payer: Self-pay | Admitting: Family

## 2016-02-17 ENCOUNTER — Ambulatory Visit (HOSPITAL_COMMUNITY)
Admission: RE | Admit: 2016-02-17 | Discharge: 2016-02-17 | Disposition: A | Discharge status: Home / Self Care | Payer: Medicare Other | Source: Ambulatory Visit | Attending: Vascular Surgery | Admitting: Vascular Surgery

## 2016-02-17 VITALS — BP 188/96 | HR 78 | Temp 97.0°F | Ht 62.5 in | Wt 167.3 lb

## 2016-02-17 DIAGNOSIS — E538 Deficiency of other specified B group vitamins: Secondary | ICD-10-CM | POA: Diagnosis not present

## 2016-02-17 DIAGNOSIS — I779 Disorder of arteries and arterioles, unspecified: Secondary | ICD-10-CM

## 2016-02-17 DIAGNOSIS — I739 Peripheral vascular disease, unspecified: Secondary | ICD-10-CM

## 2016-02-17 DIAGNOSIS — I701 Atherosclerosis of renal artery: Secondary | ICD-10-CM | POA: Diagnosis not present

## 2016-02-17 DIAGNOSIS — Z95828 Presence of other vascular implants and grafts: Secondary | ICD-10-CM | POA: Diagnosis not present

## 2016-02-17 DIAGNOSIS — N280 Ischemia and infarction of kidney: Secondary | ICD-10-CM | POA: Diagnosis not present

## 2016-02-17 NOTE — Progress Notes (Signed)
[facility] [place]   CC: Follow up peripheral artery occlusive disease, renal artery stenosis  History of Present Illness Stacy Erickson is a 80 y.o. female patient of Dr. Kellie Simmering who has stenosis at the origin of her left renal artery with known right renal artery occlusion. She is also s/p saphenous vein graft in the left leg-femoral-popliteal-in 2005.   She does have good renal function with a recent creatinine of 1.2 in November 2017, and has well-controlled hypertension at home, is elevated at this time as she states she had a harried morning.   Dr. Kellie Simmering last evaluated pt on 02-11-15. At that time Dr. Kellie Simmering reviewed the CT angiogram of the abdomen and also read the report from the radiologist which described calcification at the ostium of the left renal artery but no severe stenosis. Pt had a widely functioning left femoral popliteal saphenous vein graft She had mild stenosis origin left renal artery with known right renal artery occlusion She had well preserved renal function and well-controlled hypertension at that visit.   Dr. Kellie Simmering advised that pt return in 1 year with duplex scan left femoral-popliteal bypass and check ABIs as well as duplex of left renal artery. Elevated velocity at the origin of left renal artery will be again examined and compared to previous study.   Pt states her blood pressure at home a month ago was 120's/70's. Pt states she thinks her blood pressure is elevated today as she felt harried and rushed at home and getting here.  She uses a cane to walk due to her balance being "off" and left hip pain; she does not seem to indicate claudication sx's with walking. She denies any known hx of stroke or TIA.   Pt Diabetic: No Pt smoker: former smoker, quit in 2003, smoked x 35 years years ago  Pt meds include: Statin :Yes Betablocker: No ASA: Yes Other anticoagulants/antiplatelets: no  Past Medical History:  Diagnosis Date  .  Hyperlipidemia   . Hypertension   . Renal artery occlusion [facility])     Social History Social History  Substance Use Topics  . Smoking status: Former Smoker    Packs/day: 2.00    Years: 35.00    Types: Cigarettes    Quit date: 10/13/2001  . Smokeless tobacco: Never Used     Comment: nicotine patch uses.  . Alcohol use 8.4 oz/week    14 Cans of beer per week    Family History Family History  Problem Relation Age of Onset  . Heart disease Father   . Cancer Sister   . Colon cancer Other 58  . Colon polyps Child   . Colon polyps Child     Past Surgical History:  Procedure Laterality Date  . ABDOMINAL HYSTERECTOMY    . CATARACT EXTRACTION, BILATERAL  2015  . GANGLION CYST EXCISION    . JOINT REPLACEMENT     hip  . PR VEIN BYPASS GRAFT,AORTO-FEM-POP  2005   Left Fem=pop graft by Dr. Kellie Simmering    Allergies  Allergen Reactions  . Codeine Nausea And Vomiting  . Sulfa Antibiotics Itching and Nausea Only    Current Outpatient Prescriptions  Medication Sig Dispense Refill  . Acetaminophen (TYLENOL EX ST ARTHRITIS PAIN PO) Take by mouth as needed.    Marland Kitchen aspirin 81 MG tablet Take 81 mg by mouth daily.    . Cholecalciferol (VITAMIN D3) 1000 UNITS CAPS Take 4,000 Units by mouth daily.     . Cyanocobalamin (B-12) 1000 MCG/ML  KIT Inject as directed every 30 (thirty) days.    Marland Kitchen estradiol (CLIMARA - DOSED IN MG/24 HR) 0.025 mg/24hr Place 1 patch onto the skin once a week.    . furosemide (LASIX) 40 MG tablet Take 40 mg by mouth daily.    Marland Kitchen loratadine (CLARITIN) 10 MG tablet Take 10 mg by mouth daily.    . Multiple Vitamin (MULTIVITAMIN) capsule Take 1 capsule by mouth daily.    . nicotine (NICODERM CQ - DOSED IN MG/24 HOURS) 14 mg/24hr patch Place 1 patch onto the skin daily.    Marland Kitchen omeprazole (PRILOSEC) 20 MG capsule Take 20 mg by mouth daily.    . simvastatin (ZOCOR) 20 MG tablet Take 20 mg by mouth every evening.    . valsartan (DIOVAN) 40 MG tablet 2 (two) times daily.    Marland Kitchen LORazepam  (ATIVAN) 0.5 MG tablet Take 0.5 mg by mouth at bedtime.    . vitamin B-12 (CYANOCOBALAMIN) 1000 MCG tablet Take 1,000 mcg by mouth daily.     No current facility-administered medications for this visit.     ROS: See HPI for pertinent positives and negatives.   Physical Examination  Vitals:   02/17/16 1043 02/17/16 1045  BP: (!) 193/92 (!) 188/96  Pulse: 78   Temp: 97 F (36.1 C)   TempSrc: Oral   SpO2: 100%   Weight: 167 lb 4.8 oz (75.9 kg)   Height: 5' 2.5" (1.588 m)    Body mass index is 30.11 kg/m.  General: A&O x 3, WDWN, obese female. Gait: using cane, normal Eyes: PERRLA. Pulmonary: Respirations are non labored, CTAB, good air movement Cardiac: regular rhythm, no detected murmur.         Carotid Bruits Right Left   Negative Negative  Aorta is not palpable. Radial pulses: 2+ palpable and =                           VASCULAR EXAM: Extremities without ischemic changes, without Gangrene; without open wounds.                                                                                                          LE Pulses Right Left       FEMORAL  2+ palpable  2+ palpable        POPLITEAL  not palpable   not palpable       POSTERIOR TIBIAL  2+ palpable   not palpable        DORSALIS PEDIS      ANTERIOR TIBIAL not palpable  2+ palpable    Abdomen: soft, NT, no palpable masses. Skin: no rashes, no ulcers noted. Musculoskeletal: no muscle wasting or atrophy.  Neurologic: A&O X 3; Appropriate Affect ; SENSATION: normal; MOTOR FUNCTION:  moving all extremities equally, motor strength 5/5 throughout. Speech is fluent/normal. CN 2-12 intact.   ASSESSMENT: Stacy Erickson is a 80 y.o. female who has stenosis at the origin of her left renal artery with known right renal artery occlusion. She is  also s/p saphenous vein graft in the left leg-femoral-popliteal-in 2005.  She uses a cane to walk due to her balance being "off" and left hip pain; she does not seem to  indicate claudication sx's with walking.  DATA Today's left renal artery duplex suggests 60-99% left renal artery stenosis, 357 cm/s velocity at renal artery origin/proximal. No significant change compared to the last exam on 01-17-15.  Today's left LE arterial duplex indicates no evidence of restenosis of the bypass graft. No change compared to the last exam on 01-17-15. Bilateral ABI's remain stable: right is 1.01 with tri and biphasic waveforms, TBI of 0.56; left is 0.91 with biphasic waveforms, TBI of 0.81.   PLAN:  Based on the patient's vascular studies and examination, and after discussing renal artery duplex results with Dr. Donnetta Hutching, her last creatinine result of 1.2 in November 2017, and her blood pressure today compared to what pt states it was at home a month ago, pt will return to clinic in 6 months with left renal artery duplex, see Dr. Donnetta Hutching. Will be due for left LE arterial duplex and ABI's in a year.  I discussed in depth with the patient the nature of atherosclerosis, and emphasized the importance of maximal medical management including strict control of blood pressure, blood glucose, and lipid levels, obtaining regular exercise, and continued cessation of smoking.  The patient is aware that without maximal medical management the underlying atherosclerotic disease process will progress, limiting the benefit of any interventions.  The patient was given information about PAD including signs, symptoms, treatment, what symptoms should prompt the patient to seek immediate medical care, and risk reduction measures to take.  Clemon Chambers, RN, MSN, FNP-C [facility] [employer] Phone: [phone]  Clinic MD: Early  02/17/16 10:48 AM

## 2016-02-17 NOTE — Patient Instructions (Signed)
Peripheral Vascular Disease Peripheral vascular disease (PVD) is a disease of the blood vessels that are not part of your heart and brain. A simple term for PVD is poor circulation. In most cases, PVD narrows the blood vessels that carry blood from your heart to the rest of your body. This can result in a decreased supply of blood to your arms, legs, and internal organs, like your stomach or kidneys. However, it most often affects a person's lower legs and feet. There are two types of PVD.  Organic PVD. This is the more common type. It is caused by damage to the structure of blood vessels.  Functional PVD. This is caused by conditions that make blood vessels contract and tighten (spasm). Without treatment, PVD tends to get worse over time. PVD can also lead to acute ischemic limb. This is when an arm or limb suddenly has trouble getting enough blood. This is a medical emergency. Follow these instructions at home:  Take medicines only as told by your doctor.  Do not use any tobacco products, including cigarettes, chewing tobacco, or electronic cigarettes. If you need help quitting, ask your doctor.  Lose weight if you are overweight, and maintain a healthy weight as told by your doctor.  Eat a diet that is low in fat and cholesterol. If you need help, ask your doctor.  Exercise regularly. Ask your doctor for some good activities for you.  Take good care of your feet.  Wear comfortable shoes that fit well.  Check your feet often for any cuts or sores. Contact a doctor if:  You have cramps in your legs while walking.  You have leg pain when you are at rest.  You have coldness in a leg or foot.  Your skin changes.  You are unable to get or have an erection (erectile dysfunction).  You have cuts or sores on your feet that are not healing. Get help right away if:  Your arm or leg turns cold and blue.  Your arms or legs become red, warm, swollen, painful, or numb.  You have  chest pain or trouble breathing.  You suddenly have weakness in your face, arm, or leg.  You become very confused or you cannot speak.  You suddenly have a very bad headache.  You suddenly cannot see. This information is not intended to replace advice given to you by your health care provider. Make sure you discuss any questions you have with your health care provider. Document Released: 05/26/2009 Document Revised: 08/07/2015 Document Reviewed: 08/09/2013 Elsevier Interactive Patient Education  2017 [place].       Renal Artery Stenosis Introduction Renal artery stenosis (RAS) is narrowing of the artery that carries blood to your kidneys. It can affect one or both kidneys. Your kidneys filter waste and extra fluid from your blood. You get rid of the waste and fluid when you urinate. Your kidneys also make an important chemical messenger (hormone) called renin. Renin helps regulate your blood pressure. The first sign of RAS may be high blood pressure. Over time, other symptoms can develop. What are the causes? Plaque buildup in your arteries (atherosclerosis) is the main cause of RAS. The plaques that cause this are made up of:  Fat.  Cholesterol.  Calcium.  Other substances. As these substances build up in your renal artery, this slows the blood supply to your kidneys. The lack of blood and oxygen causes the signs and symptoms of RAS. A much less common cause of RAS is  a disease called fibromuscular dysplasia. This disease causes abnormal cell growth that narrows the renal artery. It is not related to atherosclerosis. It occurs mostly in women who are 22-57 years old. It may be passed down through families. What increases the risk? You may be at risk for renal artery stenosis if you:  Are a man who is at least 78 years old.  Are a woman who is at least 78 years old.  Have high blood pressure.  Have high cholesterol.  Are a smoker.  Abuse alcohol.  Have diabetes  or prediabetes.  Are overweight.  Have a family history of early heart disease. What are the signs or symptoms? RAS usually develops slowly. You may not have any signs or symptoms at first. The earliest signs may be:  Developing high blood pressure.  A sudden increase in existing high blood pressure.  No longer responding to medicine that used to control your blood pressure. Later signs and symptoms are due to kidney damage. They may include:  Fatigue.  Shortness of breath.  Swollen legs and feet.  Dry skin.  Headaches.  Muscle cramps.  Loss of appetite.  Nausea or vomiting. How is this diagnosed? Your health care provider may suspect RAS based on changes in your blood pressure and your risk factors. A physical exam will be done. Your health care provider may use a stethoscope to listen for a whooshing sound (bruit) that can occur where the renal artery is blocking blood flow. Several tests may be done to confirm a diagnosis of RAS. These may include:  Blood and urine tests to check your kidney function.  Imaging tests of your kidneys, such as:  A test that involves using sound waves to create an image of your kidneys and the blood flow to your kidneys (ultrasound).  A test in which dye is injected into one of your blood vessels so images can be taken as the dye flows through your renal arteries (angiogram). These tests can be done using X-rays, a CT scan (computed tomography angiogram, CTA), or a type of MRI (magnetic resonance angiogram, MRA). How is this treated? Making lifestyle changes to reduce your risk factors is the first treatment option for early RAS. If the blood flow to one of your kidneys is cut by more than half, you may need medicine to:  Lower your blood pressure. This is the main medical treatment for RAS. You may need more than one type of medicine for this. The two types that work best for RAS are:  ACE inhibitors.  Angiotensin receptor  blockers.  Reduce fluid in the body (diuretics).  Lower your cholesterol (statins). If medicine is not enough to control RAS, you may need surgery. This may involve:  Threading a tube with an inflatable balloon into the renal artery to force it open (angioplasty).  Removing plaque from inside the artery (endarterectomy). Follow these instructions at home:  Take medicines only as directed by your health care provider.  Make any lifestyle changes recommended by your health care provider. This may include:  Working with a dietitian to maintain a heart-healthy diet. This type of diet is low in saturated fat, salt, and added sugar.  Starting an exercise program as directed by your health care provider.  Maintaining a healthy weight.  Quitting smoking.  Not abusing alcohol.  Keep all follow-up visits as directed by your health care provider. This is important. Contact a health care provider if:  Your symptoms of RAS are not getting  better.  Your symptoms are changing or getting worse. Get help right away if:  You have very bad pain in your back or abdomen.  You have blood in your urine. This information is not intended to replace advice given to you by your health care provider. Make sure you discuss any questions you have with your health care provider. Document Released: 11/25/2004 Document Revised: 08/07/2015 Document Reviewed: 06/14/2013  2017 Elsevier

## 2016-02-18 ENCOUNTER — Encounter: Payer: Self-pay | Admitting: Internal Medicine

## 2016-03-18 DIAGNOSIS — E538 Deficiency of other specified B group vitamins: Secondary | ICD-10-CM | POA: Diagnosis not present

## 2016-03-30 DIAGNOSIS — Z6830 Body mass index (BMI) 30.0-30.9, adult: Secondary | ICD-10-CM | POA: Diagnosis not present

## 2016-03-30 DIAGNOSIS — I10 Essential (primary) hypertension: Secondary | ICD-10-CM | POA: Diagnosis not present

## 2016-04-15 DIAGNOSIS — E538 Deficiency of other specified B group vitamins: Secondary | ICD-10-CM | POA: Diagnosis not present

## 2016-05-18 DIAGNOSIS — E538 Deficiency of other specified B group vitamins: Secondary | ICD-10-CM | POA: Diagnosis not present

## 2016-05-20 DIAGNOSIS — R05 Cough: Secondary | ICD-10-CM | POA: Diagnosis not present

## 2016-06-15 DIAGNOSIS — E538 Deficiency of other specified B group vitamins: Secondary | ICD-10-CM | POA: Diagnosis not present

## 2016-07-13 DIAGNOSIS — E538 Deficiency of other specified B group vitamins: Secondary | ICD-10-CM | POA: Diagnosis not present

## 2016-08-04 ENCOUNTER — Encounter: Payer: Self-pay | Admitting: Vascular Surgery

## 2016-08-17 ENCOUNTER — Encounter: Payer: Self-pay | Admitting: Vascular Surgery

## 2016-08-17 ENCOUNTER — Ambulatory Visit (INDEPENDENT_AMBULATORY_CARE_PROVIDER_SITE_OTHER): Payer: Medicare Other | Admitting: Vascular Surgery

## 2016-08-17 ENCOUNTER — Ambulatory Visit (HOSPITAL_COMMUNITY)
Admission: RE | Admit: 2016-08-17 | Discharge: 2016-08-17 | Disposition: A | Discharge status: Home / Self Care | Payer: Medicare Other | Source: Ambulatory Visit | Attending: Vascular Surgery | Admitting: Vascular Surgery

## 2016-08-17 VITALS — BP 187/91 | HR 74 | Temp 97.2°F | Resp 16 | Ht 62.0 in | Wt 168.0 lb

## 2016-08-17 DIAGNOSIS — I779 Disorder of arteries and arterioles, unspecified: Secondary | ICD-10-CM | POA: Diagnosis not present

## 2016-08-17 DIAGNOSIS — N280 Ischemia and infarction of kidney: Secondary | ICD-10-CM

## 2016-08-17 DIAGNOSIS — R0989 Other specified symptoms and signs involving the circulatory and respiratory systems: Secondary | ICD-10-CM | POA: Diagnosis not present

## 2016-08-17 DIAGNOSIS — I739 Peripheral vascular disease, unspecified: Secondary | ICD-10-CM | POA: Diagnosis not present

## 2016-08-17 DIAGNOSIS — E538 Deficiency of other specified B group vitamins: Secondary | ICD-10-CM | POA: Diagnosis not present

## 2016-08-17 DIAGNOSIS — I701 Atherosclerosis of renal artery: Secondary | ICD-10-CM

## 2016-08-17 NOTE — Addendum Note (Signed)
Addended by: Lianne Cure A on: 08/17/2016 01:13 PM   Modules accepted: Orders

## 2016-08-17 NOTE — Progress Notes (Signed)
[facility]  Patient name: Stacy Erickson MRN: [id number] DOB: [birth year] Sex: female  REASON FOR VISIT: Follow-up left renal artery stenosis  HPI: Stacy Erickson is a 81 y.o. female here today for follow-up of left renal artery stenosis. She's had varying results of her duplex in the past. She is status post CT angiogram of this area showing callus case and at the ostium but no stenosis several years ago. Her most recent duplex showed some progression in December 2017. She is here today for 6 month follow-up. She reports that her blood pressure has been relatively stable in well managed. She has no worsening renal dysfunction. She has known occlusion of her right renal artery. She is also status post left femoropopliteal bypass with Dr. Kellie Simmering in 2005. She has no calf claudication.  Past Medical History:  Diagnosis Date  . Hyperlipidemia   . Hypertension   . Renal artery occlusion (HCC)     Family History  Problem Relation Age of Onset  . Heart disease Father   . Cancer Sister   . Colon cancer Other 72  . Colon polyps Child   . Colon polyps Child     SOCIAL HISTORY: Social History  Substance Use Topics  . Smoking status: Former Smoker    Packs/day: 2.00    Years: 35.00    Types: Cigarettes    Quit date: 10/13/2001  . Smokeless tobacco: Never Used     Comment: nicotine patch uses.  . Alcohol use 8.4 oz/week    14 Cans of beer per week    Allergies  Allergen Reactions  . Codeine Nausea And Vomiting  . Sulfa Antibiotics Itching and Nausea Only    Current Outpatient Prescriptions  Medication Sig Dispense Refill  . Acetaminophen (TYLENOL EX ST ARTHRITIS PAIN PO) Take by mouth as needed.    Marland Kitchen aspirin 81 MG tablet Take 81 mg by mouth daily.    . Cholecalciferol (VITAMIN D3) 1000 UNITS CAPS Take 4,000 Units by mouth daily.     . Cyanocobalamin (B-12) 1000 MCG/ML KIT Inject as directed every 30 (thirty) days.    Marland Kitchen  doxazosin (CARDURA) 2 MG tablet Take 2 mg by mouth daily.    Marland Kitchen estradiol (CLIMARA - DOSED IN MG/24 HR) 0.025 mg/24hr Place 1 patch onto the skin once a week.    . furosemide (LASIX) 40 MG tablet Take 20 mg by mouth daily.     Marland Kitchen loratadine (CLARITIN) 10 MG tablet Take 10 mg by mouth daily.    Marland Kitchen LORazepam (ATIVAN) 0.5 MG tablet Take 0.5 mg by mouth at bedtime.    . Multiple Vitamin (MULTIVITAMIN) capsule Take 1 capsule by mouth daily.    . nicotine (NICODERM CQ - DOSED IN MG/24 HOURS) 14 mg/24hr patch Place 1 patch onto the skin daily.    Marland Kitchen omeprazole (PRILOSEC) 20 MG capsule Take 20 mg by mouth daily.    . simvastatin (ZOCOR) 20 MG tablet Take 20 mg by mouth every evening.    . valsartan (DIOVAN) 40 MG tablet 40 mg 1 day or 1 dose.     . vitamin B-12 (CYANOCOBALAMIN) 1000 MCG tablet Take 1,000 mcg by mouth daily.     No current facility-administered medications for this visit.     REVIEW OF SYSTEMS:  '[X]'  denotes positive finding, '[ ]'  denotes negative finding Cardiac  Comments:  Chest pain or chest pressure:    Shortness of breath upon exertion:    Short of breath  when lying flat:    Irregular heart rhythm:        Vascular    Pain in calf, thigh, or hip brought on by ambulation:    Pain in feet at night that wakes you up from your sleep:     Blood clot in your veins:    Leg swelling:           PHYSICAL EXAM: Vitals:   08/17/16 1210 08/17/16 1211  BP: (!) 186/93 (!) 187/91  Pulse: 74   Resp: 16   Temp: 97.2 F (36.2 C)   SpO2: 98%   Weight: 168 lb (76.2 kg)   Height: '5\' 2"'  (1.575 m)     GENERAL: The patient is a well-nourished female, in no acute distress. The vital signs are documented above. CARDIOVASCULAR: 2+ radial pulses. She does have a 2+ cells pedis pulses bilaterally. No abdominal bruits noted PULMONARY: There is good air exchange  MUSCULOSKELETAL: There are no major deformities or cyanosis. NEUROLOGIC: No focal weakness or paresthesias are detected. SKIN: There  are no ulcers or rashes noted. PSYCHIATRIC: The patient has a normal affect.  DATA:  Duplex today shows lesser degree of stenosis in her left renal artery predicted past. This is more consistent with her CT scan. Her study showed less than 70% stenosis.  MEDICAL ISSUES: I have recommended discontinuation of duplex follow-up since that she has not had any symptoms and has been stable. She will continue her noninvasive follow-up of her left femoral-popliteal bypass. She also has a diffuse peripheral vascular occlusive disease and asking about a carotid duplex. She does not have any history of focal neurologic deficits but does have a diffuse disease. She will have a carotid duplex at her convenience in the next several weeks for follow-up of this and see her nurse practitioner in our office.    Rosetta Posner, MD FACS [facility] Tel [phone] Pager [phone]

## 2016-09-02 ENCOUNTER — Encounter: Payer: Self-pay | Admitting: Family

## 2016-09-14 ENCOUNTER — Encounter: Payer: Self-pay | Admitting: Family

## 2016-09-14 ENCOUNTER — Ambulatory Visit (HOSPITAL_COMMUNITY)
Admission: RE | Admit: 2016-09-14 | Discharge: 2016-09-14 | Disposition: A | Discharge status: Home / Self Care | Payer: Medicare Other | Source: Ambulatory Visit | Attending: Vascular Surgery | Admitting: Vascular Surgery

## 2016-09-14 ENCOUNTER — Ambulatory Visit (INDEPENDENT_AMBULATORY_CARE_PROVIDER_SITE_OTHER): Payer: Medicare Other | Admitting: Family

## 2016-09-14 VITALS — BP 143/82 | HR 71 | Temp 99.2°F | Resp 20 | Ht 62.0 in | Wt 177.5 lb

## 2016-09-14 DIAGNOSIS — Z95828 Presence of other vascular implants and grafts: Secondary | ICD-10-CM | POA: Diagnosis not present

## 2016-09-14 DIAGNOSIS — I6523 Occlusion and stenosis of bilateral carotid arteries: Secondary | ICD-10-CM | POA: Diagnosis not present

## 2016-09-14 DIAGNOSIS — R0989 Other specified symptoms and signs involving the circulatory and respiratory systems: Secondary | ICD-10-CM

## 2016-09-14 DIAGNOSIS — I779 Disorder of arteries and arterioles, unspecified: Secondary | ICD-10-CM

## 2016-09-14 DIAGNOSIS — E538 Deficiency of other specified B group vitamins: Secondary | ICD-10-CM | POA: Diagnosis not present

## 2016-09-14 LAB — VAS US CAROTID
LCCADDIAS: -20 cm/s
LEFT ECA DIAS: -2 cm/s
LEFT VERTEBRAL DIAS: -18 cm/s
LICADDIAS: -20 cm/s
LICAPSYS: -109 cm/s
Left CCA dist sys: -73 cm/s
Left CCA prox dias: 20 cm/s
Left CCA prox sys: 92 cm/s
Left ICA dist sys: -58 cm/s
Left ICA prox dias: -23 cm/s
RIGHT CCA MID DIAS: 15 cm/s
RIGHT ECA DIAS: -17 cm/s
RIGHT VERTEBRAL DIAS: -14 cm/s
Right CCA prox dias: 19 cm/s
Right CCA prox sys: 100 cm/s
Right cca dist sys: -64 cm/s

## 2016-09-14 NOTE — Progress Notes (Signed)
[facility] [place] HISTORY AND PHYSICAL   CC: Carotid Artery Evaluation, History of Peripheral Artery Occlusive Disease  History of Present Illness:   Stacy Erickson is a 81 y.o. female who has stenosis at the origin of her left renal artery with known right renal artery occlusion. She is also s/p saphenous vein graft in the left leg-femoral-popliteal-in 2005 by Dr. Kellie Simmering.   Dr. Kellie Simmering last evaluated pt on 02-11-15. At that time Dr. Kellie Simmering reviewed the CT angiogram of the abdomen and also read the report from the radiologist which described calcification at the ostium of the left renal artery but no severe stenosis. Pt had awidely functioning left femoral popliteal saphenous vein graft She hadmild stenosis origin left renal artery with known right renal artery occlusion She had well preserved renal function and well-controlled hypertension at that visit.  Dr. Kellie Simmering advised that ptreturn in 1 year with duplex scan left femoral-popliteal bypass and check ABIs as well as duplex of left renal artery. Elevated velocity at the origin of left renal artery will be again examined and compared to previous study.   At her 08-17-16 visit with Dr. Donnetta Hutching, he recommended discontinuation of renal duplex follow-up since she has not had any symptoms and has been stable. She will continue her noninvasive follow-up of her left femoral-popliteal bypass. She also has a diffuse peripheral vascular occlusive disease and asked about a carotid duplex. She does not have any history of focal neurologic deficits but does have a diffuse disease.   She returns today for carotid duplex.  She uses a cane to walk due to her balance being "off" and left hip pain; she does not seem to indicate claudication sx's with walking. She denies any known hx of stroke or TIA.   Pt Diabetic: No Pt smoker: former smoker, quit in 2003, smoked x 35 years  Pt meds include: Statin :Yes Betablocker: No ASA:  Yes Other anticoagulants/antiplatelets: no    Current Outpatient Prescriptions  Medication Sig Dispense Refill  . Acetaminophen (TYLENOL EX ST ARTHRITIS PAIN PO) Take by mouth as needed.    Marland Kitchen aspirin 81 MG tablet Take 81 mg by mouth daily.    . Cholecalciferol (VITAMIN D3) 1000 UNITS CAPS Take 4,000 Units by mouth daily.     . Cyanocobalamin (B-12) 1000 MCG/ML KIT Inject as directed every 30 (thirty) days.    Marland Kitchen doxazosin (CARDURA) 2 MG tablet Take 2 mg by mouth daily.    Marland Kitchen estradiol (CLIMARA - DOSED IN MG/24 HR) 0.025 mg/24hr Place 1 patch onto the skin once a week.    . furosemide (LASIX) 40 MG tablet Take 20 mg by mouth daily.     Marland Kitchen loratadine (CLARITIN) 10 MG tablet Take 10 mg by mouth daily.    Marland Kitchen LORazepam (ATIVAN) 0.5 MG tablet Take 0.5 mg by mouth at bedtime.    . Multiple Vitamin (MULTIVITAMIN) capsule Take 1 capsule by mouth daily.    . nicotine (NICODERM CQ - DOSED IN MG/24 HOURS) 14 mg/24hr patch Place 1 patch onto the skin daily.    Marland Kitchen omeprazole (PRILOSEC) 20 MG capsule Take 20 mg by mouth daily.    . simvastatin (ZOCOR) 20 MG tablet Take 20 mg by mouth every evening.    . valsartan (DIOVAN) 40 MG tablet 40 mg 1 day or 1 dose.     . vitamin B-12 (CYANOCOBALAMIN) 1000 MCG tablet Take 1,000 mcg by mouth daily.     No current facility-administered medications for this visit.  Past Medical History:  Diagnosis Date  . Hyperlipidemia   . Hypertension   . Renal artery occlusion [facility])     Social History Social History  Substance Use Topics  . Smoking status: Former Smoker    Packs/day: 2.00    Years: 35.00    Types: Cigarettes    Quit date: 10/13/2001  . Smokeless tobacco: Never Used     Comment: nicotine patch uses.  . Alcohol use 8.4 oz/week    14 Cans of beer per week    Family History Family History  Problem Relation Age of Onset  . Heart disease Father   . Cancer Sister   . Colon cancer Other 32  . Colon polyps Child   . Colon polyps Child      Surgical History Past Surgical History:  Procedure Laterality Date  . ABDOMINAL HYSTERECTOMY    . CATARACT EXTRACTION, BILATERAL  2015  . GANGLION CYST EXCISION    . JOINT REPLACEMENT     hip  . PR VEIN BYPASS GRAFT,AORTO-FEM-POP  2005   Left Fem=pop graft by Dr. Kellie Simmering    Allergies  Allergen Reactions  . Codeine Nausea And Vomiting  . Sulfa Antibiotics Itching and Nausea Only    Current Outpatient Prescriptions  Medication Sig Dispense Refill  . Acetaminophen (TYLENOL EX ST ARTHRITIS PAIN PO) Take by mouth as needed.    Marland Kitchen aspirin 81 MG tablet Take 81 mg by mouth daily.    . Cholecalciferol (VITAMIN D3) 1000 UNITS CAPS Take 4,000 Units by mouth daily.     . Cyanocobalamin (B-12) 1000 MCG/ML KIT Inject as directed every 30 (thirty) days.    Marland Kitchen doxazosin (CARDURA) 2 MG tablet Take 2 mg by mouth daily.    Marland Kitchen estradiol (CLIMARA - DOSED IN MG/24 HR) 0.025 mg/24hr Place 1 patch onto the skin once a week.    . furosemide (LASIX) 40 MG tablet Take 20 mg by mouth daily.     Marland Kitchen loratadine (CLARITIN) 10 MG tablet Take 10 mg by mouth daily.    Marland Kitchen LORazepam (ATIVAN) 0.5 MG tablet Take 0.5 mg by mouth at bedtime.    . Multiple Vitamin (MULTIVITAMIN) capsule Take 1 capsule by mouth daily.    . nicotine (NICODERM CQ - DOSED IN MG/24 HOURS) 14 mg/24hr patch Place 1 patch onto the skin daily.    Marland Kitchen omeprazole (PRILOSEC) 20 MG capsule Take 20 mg by mouth daily.    . simvastatin (ZOCOR) 20 MG tablet Take 20 mg by mouth every evening.    . valsartan (DIOVAN) 40 MG tablet 40 mg 1 day or 1 dose.     . vitamin B-12 (CYANOCOBALAMIN) 1000 MCG tablet Take 1,000 mcg by mouth daily.     No current facility-administered medications for this visit.      REVIEW OF SYSTEMS: See HPI for pertinent positives and negatives.  Physical Examination Vitals:   09/14/16 1449 09/14/16 1452  BP: (!) 155/82 (!) 143/82  Pulse: 71   Resp: 20   Temp: 99.2 F (37.3 C)   TempSrc: Oral   SpO2: 98%   Weight: 177 lb  8 oz (80.5 kg)   Height: '5\' 2"'  (1.575 m)    Body mass index is 32.47 kg/m.  General:  A&O x 3, WDWN, obese female. Gait: using cane, steady Eyes: PERRLA. Pulmonary: Respirations are non labored, CTAB, fair air movement Cardiac: regular rhythm and rate, no detected murmur.         Carotid Bruits Right Left  Negative Negative  Aorta is not palpable. Radial pulses: 2+ palpable and =                           VASCULAR EXAM: Extremities without ischemic changes, without Gangrene; without open wounds.                                                                                                                                                       LE Pulses Right Left       FEMORAL  2+ palpable  2+ palpable        POPLITEAL  not palpable   not palpable       POSTERIOR TIBIAL  2+ palpable   not palpable        DORSALIS PEDIS      ANTERIOR TIBIAL not palpable  2+ palpable    Abdomen: soft, NT, no palpable masses. Skin: no rashes, no ulcers noted. Musculoskeletal: no muscle wasting or atrophy.         Neurologic: A&O X 3; Appropriate Affect ; SENSATION: normal; MOTOR FUNCTION:  moving all extremities equally, motor strength 5/5 throughout. Speech is fluent/normal. CN 2-12 intact.    ASSESSMENT:  Stacy Erickson is a 81 y.o. female who has stenosis at the origin of her left renal artery with known right renal artery occlusion. Her blood pressure has improved since her medications were adjusted.  Dr. Donnetta Hutching recommended discontinuation of renal duplex follow-up at her visit on 08-17-16, since she has not had any symptoms and has been stable. She is also s/p saphenous vein graft in the left leg-femoral-popliteal-in 2005.  She uses a cane to walk due to her balance being "off" and left hip pain; she does not seem to indicate claudication sx's with walking.  She denies any hx of stroke or TIA, states she has a niece with identical medical problems who has significant stenosis  of her carotid arteries.   DATA  Carotid Duplex (09/14/16): No significant stenosis of the common carotid arteries.  Bilateral proximal ICA with 1-39% stenosis. Bilateral ECA with >50% stenosis. Bilateral vertebral artery flow is antegrade.  Bilateral subclavian artery waveforms are normal.  No prior exam for comparison.   Left LE arterial Duplex (02-17-16): No evidence of restenosis of the bypass graft. No change compared to the last exam on 01-17-15.  ABI (Date: 02-17-16): Bilateral ABI's remain stable: right is 1.01 with tri and biphasic waveforms, TBI of 0.56; left is 0.91 with biphasic waveforms, TBI of 0.81.  PLAN:   Graduated walking program.   Based on today's exam and non-invasive vascular lab results, the patient will follow up in June 2019 as already scheduled with the following tests: Left LE arterial duplex and ABI's; carotid duplex in 2-3 years.  I discussed in depth  with the patient the nature of atherosclerosis, and emphasized the importance of maximal medical management including strict control of blood pressure, blood glucose, and lipid levels, obtaining regular exercise, and cessation of smoking.  The patient is aware that without maximal medical management the underlying atherosclerotic disease process will progress, limiting the benefit of any interventions.  The patient was given information about stroke prevention and what symptoms should prompt the patient to seek immediate medical care.  The patient was given information about PAD including signs, symptoms, treatment, what symptoms should prompt the patient to seek immediate medical care, and risk reduction measures to take.  Thank you for allowing [place] to participate in this patient's care.  Clemon Chambers, RN, MSN, FNP-C [facility] Office: [phone]  Clinic MD: Scot Dock on call 09/14/2016 2:54 PM

## 2016-09-14 NOTE — Patient Instructions (Signed)

## 2016-10-14 DIAGNOSIS — E538 Deficiency of other specified B group vitamins: Secondary | ICD-10-CM | POA: Diagnosis not present

## 2016-10-14 DIAGNOSIS — R300 Dysuria: Secondary | ICD-10-CM | POA: Diagnosis not present

## 2016-10-14 DIAGNOSIS — R358 Other polyuria: Secondary | ICD-10-CM | POA: Diagnosis not present

## 2016-11-10 DIAGNOSIS — E538 Deficiency of other specified B group vitamins: Secondary | ICD-10-CM | POA: Diagnosis not present

## 2016-12-07 DIAGNOSIS — E538 Deficiency of other specified B group vitamins: Secondary | ICD-10-CM | POA: Diagnosis not present

## 2017-01-05 DIAGNOSIS — E538 Deficiency of other specified B group vitamins: Secondary | ICD-10-CM | POA: Diagnosis not present

## 2017-01-05 DIAGNOSIS — Z23 Encounter for immunization: Secondary | ICD-10-CM | POA: Diagnosis not present

## 2017-01-10 DIAGNOSIS — E538 Deficiency of other specified B group vitamins: Secondary | ICD-10-CM | POA: Diagnosis not present

## 2017-01-10 DIAGNOSIS — I10 Essential (primary) hypertension: Secondary | ICD-10-CM | POA: Diagnosis not present

## 2017-01-10 DIAGNOSIS — R82998 Other abnormal findings in urine: Secondary | ICD-10-CM | POA: Diagnosis not present

## 2017-01-10 DIAGNOSIS — E7849 Other hyperlipidemia: Secondary | ICD-10-CM | POA: Diagnosis not present

## 2017-01-12 DIAGNOSIS — D0461 Carcinoma in situ of skin of right upper limb, including shoulder: Secondary | ICD-10-CM | POA: Diagnosis not present

## 2017-01-12 DIAGNOSIS — L814 Other melanin hyperpigmentation: Secondary | ICD-10-CM | POA: Diagnosis not present

## 2017-01-12 DIAGNOSIS — D485 Neoplasm of uncertain behavior of skin: Secondary | ICD-10-CM | POA: Diagnosis not present

## 2017-01-12 DIAGNOSIS — L570 Actinic keratosis: Secondary | ICD-10-CM | POA: Diagnosis not present

## 2017-01-13 DIAGNOSIS — E538 Deficiency of other specified B group vitamins: Secondary | ICD-10-CM | POA: Diagnosis not present

## 2017-01-13 DIAGNOSIS — I701 Atherosclerosis of renal artery: Secondary | ICD-10-CM | POA: Diagnosis not present

## 2017-01-13 DIAGNOSIS — Z0000 Encounter for general adult medical examination without abnormal findings: Secondary | ICD-10-CM | POA: Diagnosis not present

## 2017-01-13 DIAGNOSIS — I7389 Other specified peripheral vascular diseases: Secondary | ICD-10-CM | POA: Diagnosis not present

## 2017-01-13 DIAGNOSIS — Z6830 Body mass index (BMI) 30.0-30.9, adult: Secondary | ICD-10-CM | POA: Diagnosis not present

## 2017-01-13 DIAGNOSIS — R808 Other proteinuria: Secondary | ICD-10-CM | POA: Diagnosis not present

## 2017-01-13 DIAGNOSIS — D692 Other nonthrombocytopenic purpura: Secondary | ICD-10-CM | POA: Diagnosis not present

## 2017-01-13 DIAGNOSIS — Z1389 Encounter for screening for other disorder: Secondary | ICD-10-CM | POA: Diagnosis not present

## 2017-01-13 DIAGNOSIS — J449 Chronic obstructive pulmonary disease, unspecified: Secondary | ICD-10-CM | POA: Diagnosis not present

## 2017-01-13 DIAGNOSIS — N183 Chronic kidney disease, stage 3 (moderate): Secondary | ICD-10-CM | POA: Diagnosis not present

## 2017-01-13 DIAGNOSIS — R300 Dysuria: Secondary | ICD-10-CM | POA: Diagnosis not present

## 2017-01-13 DIAGNOSIS — E7849 Other hyperlipidemia: Secondary | ICD-10-CM | POA: Diagnosis not present

## 2017-02-01 DIAGNOSIS — E538 Deficiency of other specified B group vitamins: Secondary | ICD-10-CM | POA: Diagnosis not present

## 2017-03-02 DIAGNOSIS — E538 Deficiency of other specified B group vitamins: Secondary | ICD-10-CM | POA: Diagnosis not present

## 2017-03-30 DIAGNOSIS — E538 Deficiency of other specified B group vitamins: Secondary | ICD-10-CM | POA: Diagnosis not present

## 2017-04-27 DIAGNOSIS — E538 Deficiency of other specified B group vitamins: Secondary | ICD-10-CM | POA: Diagnosis not present

## 2017-05-12 DIAGNOSIS — Z85828 Personal history of other malignant neoplasm of skin: Secondary | ICD-10-CM | POA: Diagnosis not present

## 2017-05-12 DIAGNOSIS — L720 Epidermal cyst: Secondary | ICD-10-CM | POA: Diagnosis not present

## 2017-05-25 DIAGNOSIS — E538 Deficiency of other specified B group vitamins: Secondary | ICD-10-CM | POA: Diagnosis not present

## 2017-06-21 DIAGNOSIS — E538 Deficiency of other specified B group vitamins: Secondary | ICD-10-CM | POA: Diagnosis not present

## 2017-07-19 DIAGNOSIS — E538 Deficiency of other specified B group vitamins: Secondary | ICD-10-CM | POA: Diagnosis not present

## 2017-08-23 ENCOUNTER — Encounter (HOSPITAL_COMMUNITY): Payer: Medicare Other

## 2017-08-23 ENCOUNTER — Ambulatory Visit: Payer: Medicare Other | Admitting: Family

## 2017-08-23 DIAGNOSIS — E538 Deficiency of other specified B group vitamins: Secondary | ICD-10-CM | POA: Diagnosis not present

## 2017-09-06 ENCOUNTER — Encounter: Payer: Self-pay | Admitting: Family

## 2017-09-06 ENCOUNTER — Ambulatory Visit (INDEPENDENT_AMBULATORY_CARE_PROVIDER_SITE_OTHER)
Admission: RE | Admit: 2017-09-06 | Discharge: 2017-09-06 | Disposition: A | Discharge status: Home / Self Care | Payer: Medicare Other | Source: Ambulatory Visit | Attending: Vascular Surgery | Admitting: Vascular Surgery

## 2017-09-06 ENCOUNTER — Ambulatory Visit (INDEPENDENT_AMBULATORY_CARE_PROVIDER_SITE_OTHER): Payer: Medicare Other | Admitting: Family

## 2017-09-06 ENCOUNTER — Ambulatory Visit (HOSPITAL_COMMUNITY)
Admission: RE | Admit: 2017-09-06 | Discharge: 2017-09-06 | Disposition: A | Discharge status: Home / Self Care | Payer: Medicare Other | Source: Ambulatory Visit | Attending: Vascular Surgery | Admitting: Vascular Surgery

## 2017-09-06 VITALS — BP 170/82 | HR 82 | Temp 97.6°F | Resp 18 | Ht 62.0 in | Wt 159.0 lb

## 2017-09-06 DIAGNOSIS — I779 Disorder of arteries and arterioles, unspecified: Secondary | ICD-10-CM

## 2017-09-06 DIAGNOSIS — I739 Peripheral vascular disease, unspecified: Secondary | ICD-10-CM

## 2017-09-06 DIAGNOSIS — I6523 Occlusion and stenosis of bilateral carotid arteries: Secondary | ICD-10-CM | POA: Diagnosis not present

## 2017-09-06 DIAGNOSIS — I10 Essential (primary) hypertension: Secondary | ICD-10-CM | POA: Insufficient documentation

## 2017-09-06 DIAGNOSIS — Z95828 Presence of other vascular implants and grafts: Secondary | ICD-10-CM | POA: Diagnosis not present

## 2017-09-06 DIAGNOSIS — E785 Hyperlipidemia, unspecified: Secondary | ICD-10-CM | POA: Insufficient documentation

## 2017-09-06 DIAGNOSIS — N280 Ischemia and infarction of kidney: Secondary | ICD-10-CM

## 2017-09-06 NOTE — Progress Notes (Signed)
[facility] [place]   CC: Follow up peripheral artery occlusive disease  History of Present Illness Stacy Erickson is a 82 y.o. female who hasstenosis at the origin of herleft renal artery with known right renal artery occlusion. She is also s/p saphenous vein graft in the left leg-femoral-popliteal-in 2005 by Dr. Kellie Simmering.   Dr. Kellie Simmering last evaluated pt on 02-11-15. At that time Dr. Kellie Simmering reviewed the CT angiogram of the abdomen and also read the report from the radiologist which describedcalcification at the ostium of the left renal artery but no severe stenosis. Pt had awidely functioning left femoral popliteal saphenous vein graft She hadmild stenosis origin left renal artery with known right renal artery occlusion She had well preserved renal function and well-controlled hypertension at that visit.  Dr. Kellie Simmering advised that ptreturn in 1 year with duplex scan left femoral-popliteal bypass and check ABIs as well as duplex of left renal artery. Elevated velocity at the origin of left renal artery will be again examined and compared to previous study.   At her 08-17-16 visit with Dr. Donnetta Hutching, he recommended discontinuation of renal duplex follow-up since she has not had any symptoms and has been stable. She will continue her noninvasive follow-up of her left femoral-popliteal bypass. She also has a diffuse peripheral vascular occlusive disease and asked about a carotid duplex. She does not have any history of focal neurologic deficits but does have a diffuse disease.   She uses a cane to walk due to her balance being "off" and left hip pain; she does not seem to indicate claudication sx's with walking. She denies any known hx of stroke or TIA.   Pt states her blood pressure at home is about 135/85-90  Diabetic: No Tobacco use: former smoker, quit in 2003, smoked x 35 years  Pt meds include: Statin :Yes Betablocker: No ASA: Yes Other  anticoagulants/antiplatelets: no    Past Medical History:  Diagnosis Date  . Hyperlipidemia   . Hypertension   . Renal artery occlusion [facility])     Social History Social History   Tobacco Use  . Smoking status: Former Smoker    Packs/day: 2.00    Years: 35.00    Pack years: 70.00    Types: Cigarettes    Last attempt to quit: 10/13/2001    Years since quitting: 15.9  . Smokeless tobacco: Never Used  . Tobacco comment: nicotine patch uses.  Substance Use Topics  . Alcohol use: Yes    Alcohol/week: 8.4 oz    Types: 14 Cans of beer per week  . Drug use: No    Family History Family History  Problem Relation Age of Onset  . Heart disease Father   . Cancer Sister   . Colon cancer Other 65  . Colon polyps Child   . Colon polyps Child     Past Surgical History:  Procedure Laterality Date  . ABDOMINAL HYSTERECTOMY    . CATARACT EXTRACTION, BILATERAL  2015  . GANGLION CYST EXCISION    . JOINT REPLACEMENT     hip  . PR VEIN BYPASS GRAFT,AORTO-FEM-POP  2005   Left Fem=pop graft by Dr. Kellie Simmering    Allergies  Allergen Reactions  . Codeine Nausea And Vomiting  . Sulfa Antibiotics Itching and Nausea Only    Current Outpatient Medications  Medication Sig Dispense Refill  . Acetaminophen (TYLENOL EX ST ARTHRITIS PAIN PO) Take by mouth as needed.    Marland Kitchen aspirin 81 MG tablet Take 81 mg by  mouth daily.    . Cholecalciferol (VITAMIN D3) 1000 UNITS CAPS Take 4,000 Units by mouth daily.     . Cyanocobalamin (B-12) 1000 MCG/ML KIT Inject as directed every 30 (thirty) days.    Marland Kitchen estradiol (CLIMARA - DOSED IN MG/24 HR) 0.025 mg/24hr Place 1 patch onto the skin once a week.    . furosemide (LASIX) 40 MG tablet Take 20 mg by mouth daily.     . irbesartan (AVAPRO) 75 MG tablet Take 75 mg by mouth daily.    Marland Kitchen loratadine (CLARITIN) 10 MG tablet Take 10 mg by mouth daily.    Marland Kitchen LORazepam (ATIVAN) 0.5 MG tablet Take 0.5 mg by mouth at bedtime.    . Multiple Vitamin (MULTIVITAMIN) capsule Take  1 capsule by mouth daily.    . nicotine (NICODERM CQ - DOSED IN MG/24 HOURS) 14 mg/24hr patch Place 1 patch onto the skin daily.    Marland Kitchen omeprazole (PRILOSEC) 20 MG capsule Take 20 mg by mouth daily.    . simvastatin (ZOCOR) 20 MG tablet Take 20 mg by mouth every evening.    . vitamin B-12 (CYANOCOBALAMIN) 1000 MCG tablet Take 1,000 mcg by mouth daily.    Marland Kitchen doxazosin (CARDURA) 2 MG tablet Take 2 mg by mouth daily.    . valsartan (DIOVAN) 40 MG tablet 40 mg 1 day or 1 dose.      No current facility-administered medications for this visit.     ROS: See HPI for pertinent positives and negatives.   Physical Examination  Vitals:   09/06/17 1358 09/06/17 1407  BP: (!) 192/102 (!) 170/82  Pulse: 82   Resp: 18   Temp: 97.6 F (36.4 C)   TempSrc: Oral   SpO2: 100%   Weight: 159 lb (72.1 kg)   Height: '5\' 2"'  (1.575 m)    Body mass index is 29.08 kg/m.  General: A&O x 3, WDWN, female. Gait: using cane, steady Eyes: PERRLA. Pulmonary: Respirations are non labored, CTAB, fair air movement in all fields.  Cardiac: regular rhythm and rate, no detected murmur.    Carotid Bruits Right Left   Negative Negative   Abdominal aortic pulse is notpalpable. Radial pulses: 2+ palpable and =  VASCULAR EXAM: Extremitieswithoutischemic changes, withoutGangrene; withoutopen wounds.  LE Pulses Right Left  FEMORAL 2+palpable 2+palpable   POPLITEAL notpalpable  notpalpable  POSTERIOR TIBIAL 1+palpable  notpalpable   DORSALIS PEDIS ANTERIOR TIBIAL notpalpable  2+palpable    Abdomen: soft, NT, no palpable masses. Musculoskeletal: no muscle wasting or atrophy. Skin: no rashes, no cellulitis, no ulcers noted. Neurologic: A&O X 3; appropriate affect, Sensation is normal; MOTOR FUNCTION:  moving all extremities equally, motor strength 5/5 throughout. Speech is fluent/normal. CN 2-12  intact. Psychiatric: Thought content is normal, mood appropriate for clinical situation.     ASSESSMENT: Stacy Erickson is a 82 y.o. female  who hasstenosis at the origin of herleft renal artery with known right renal artery occlusion. Her blood pressure has improved since her medications were adjusted.  Dr. Donnetta Hutching recommended discontinuation of renal duplex follow-up at her visit on 08-17-16, since she has not had any symptoms and has been stable. She is also s/p saphenous vein graft in the left leg-femoral-popliteal-in 2005.  She uses a cane to walk due to her balance being "off" and left hip pain; she does not seem to indicate claudication sx's with walking.  She has been walking more, but bilateral ABI's have declined, however, bilateral pedal pulses are palpable.   She denies  any hx of stroke or TIA, states she has a niece with identical medical problems who has significant stenosis of her carotid arteries.   Pt states her blood pressure at home is about 135/85-90. She denies headache, denies dyspnea or chest pain, states occasional light headedness. I advised her to check her blood pressure when she gets home, and if >150/90 to notify her PCP.    DATA  Left LE arterial Duplex (09-06-17): No evidence of restenosis of the bypass graft.  Inflow disease with PSV of 224 cm/s All biphasic waveforms.  No significant change compared to the exams on 01-17-15 and 02-17-16.    ABI (Date: 09/06/2017):  R:   ABI: 0.77 (was 1.02 on 02-17-16),   PT: bi  DP: bi  TBI:  0.53 (was 0.56)  L:   ABI: 0.83 (was 091),   PT: bi  DP: tri  TBI: 0.61 (was 0.81)  Decline in bilateral ABI and left TBI. Moderate disease in the right with biphasic waveforms. Mild disease in the left with tri and biphasic waveforms.     Carotid Duplex (09/14/16): No significant stenosis of the common carotid arteries.  Bilateral proximal ICA with 1-39% stenosis. Bilateral ECA with >50% stenosis. Bilateral  vertebral artery flow is antegrade.  Bilateral subclavian artery waveforms are normal.  No prior exam for comparison.      PLAN:   Graduated walking program.   Based on today's exam and non-invasive vascular lab results, the patient will follow up in 6 months with the following tests: Left LE arterial duplex and ABI's; carotid duplex in 2-3 years.     I discussed in depth with the patient the nature of atherosclerosis, and emphasized the importance of maximal medical management including strict control of blood pressure, blood glucose, and lipid levels, obtaining regular exercise, and continued cessation of smoking.  The patient is aware that without maximal medical management the underlying atherosclerotic disease process will progress, limiting the benefit of any interventions.  The patient was given information about PAD including signs, symptoms, treatment, what symptoms should prompt the patient to seek immediate medical care, and risk reduction measures to take.  Clemon Chambers, RN, MSN, FNP-C [facility] [employer] Phone: [phone]  Clinic MD: Early  09/06/17 2:30 PM

## 2017-09-06 NOTE — Patient Instructions (Signed)

## 2017-09-21 ENCOUNTER — Other Ambulatory Visit: Payer: Self-pay

## 2017-09-21 DIAGNOSIS — Z95828 Presence of other vascular implants and grafts: Secondary | ICD-10-CM

## 2017-09-21 DIAGNOSIS — I701 Atherosclerosis of renal artery: Secondary | ICD-10-CM

## 2017-09-21 DIAGNOSIS — I779 Disorder of arteries and arterioles, unspecified: Secondary | ICD-10-CM

## 2017-09-21 DIAGNOSIS — I739 Peripheral vascular disease, unspecified: Secondary | ICD-10-CM

## 2017-09-22 DIAGNOSIS — E538 Deficiency of other specified B group vitamins: Secondary | ICD-10-CM | POA: Diagnosis not present

## 2017-10-25 DIAGNOSIS — E538 Deficiency of other specified B group vitamins: Secondary | ICD-10-CM | POA: Diagnosis not present

## 2017-11-24 DIAGNOSIS — E538 Deficiency of other specified B group vitamins: Secondary | ICD-10-CM | POA: Diagnosis not present

## 2017-12-29 DIAGNOSIS — Z23 Encounter for immunization: Secondary | ICD-10-CM | POA: Diagnosis not present

## 2017-12-29 DIAGNOSIS — E538 Deficiency of other specified B group vitamins: Secondary | ICD-10-CM | POA: Diagnosis not present

## 2018-01-26 DIAGNOSIS — Z961 Presence of intraocular lens: Secondary | ICD-10-CM | POA: Diagnosis not present

## 2018-01-26 DIAGNOSIS — H02831 Dermatochalasis of right upper eyelid: Secondary | ICD-10-CM | POA: Diagnosis not present

## 2018-01-26 DIAGNOSIS — H1851 Endothelial corneal dystrophy: Secondary | ICD-10-CM | POA: Diagnosis not present

## 2018-01-26 DIAGNOSIS — H02834 Dermatochalasis of left upper eyelid: Secondary | ICD-10-CM | POA: Diagnosis not present

## 2018-01-26 DIAGNOSIS — H43813 Vitreous degeneration, bilateral: Secondary | ICD-10-CM | POA: Diagnosis not present

## 2018-01-26 DIAGNOSIS — H10413 Chronic giant papillary conjunctivitis, bilateral: Secondary | ICD-10-CM | POA: Diagnosis not present

## 2018-01-26 DIAGNOSIS — H04123 Dry eye syndrome of bilateral lacrimal glands: Secondary | ICD-10-CM | POA: Diagnosis not present

## 2018-01-31 DIAGNOSIS — E538 Deficiency of other specified B group vitamins: Secondary | ICD-10-CM | POA: Diagnosis not present

## 2018-02-16 DIAGNOSIS — N183 Chronic kidney disease, stage 3 (moderate): Secondary | ICD-10-CM | POA: Diagnosis not present

## 2018-02-16 DIAGNOSIS — R82998 Other abnormal findings in urine: Secondary | ICD-10-CM | POA: Diagnosis not present

## 2018-02-16 DIAGNOSIS — E538 Deficiency of other specified B group vitamins: Secondary | ICD-10-CM | POA: Diagnosis not present

## 2018-02-16 DIAGNOSIS — E7849 Other hyperlipidemia: Secondary | ICD-10-CM | POA: Diagnosis not present

## 2018-02-16 DIAGNOSIS — I10 Essential (primary) hypertension: Secondary | ICD-10-CM | POA: Diagnosis not present

## 2018-02-16 DIAGNOSIS — M859 Disorder of bone density and structure, unspecified: Secondary | ICD-10-CM | POA: Diagnosis not present

## 2018-02-21 DIAGNOSIS — E538 Deficiency of other specified B group vitamins: Secondary | ICD-10-CM | POA: Diagnosis not present

## 2018-02-21 DIAGNOSIS — Z0000 Encounter for general adult medical examination without abnormal findings: Secondary | ICD-10-CM | POA: Diagnosis not present

## 2018-02-21 DIAGNOSIS — I10 Essential (primary) hypertension: Secondary | ICD-10-CM | POA: Diagnosis not present

## 2018-02-21 DIAGNOSIS — Z6829 Body mass index (BMI) 29.0-29.9, adult: Secondary | ICD-10-CM | POA: Diagnosis not present

## 2018-02-21 DIAGNOSIS — E7849 Other hyperlipidemia: Secondary | ICD-10-CM | POA: Diagnosis not present

## 2018-02-21 DIAGNOSIS — I701 Atherosclerosis of renal artery: Secondary | ICD-10-CM | POA: Diagnosis not present

## 2018-02-21 DIAGNOSIS — N183 Chronic kidney disease, stage 3 (moderate): Secondary | ICD-10-CM | POA: Diagnosis not present

## 2018-02-21 DIAGNOSIS — M859 Disorder of bone density and structure, unspecified: Secondary | ICD-10-CM | POA: Diagnosis not present

## 2018-02-21 DIAGNOSIS — Z1389 Encounter for screening for other disorder: Secondary | ICD-10-CM | POA: Diagnosis not present

## 2018-02-21 DIAGNOSIS — I7389 Other specified peripheral vascular diseases: Secondary | ICD-10-CM | POA: Diagnosis not present

## 2018-02-21 DIAGNOSIS — R808 Other proteinuria: Secondary | ICD-10-CM | POA: Diagnosis not present

## 2018-02-21 DIAGNOSIS — J449 Chronic obstructive pulmonary disease, unspecified: Secondary | ICD-10-CM | POA: Diagnosis not present

## 2018-02-28 DIAGNOSIS — E538 Deficiency of other specified B group vitamins: Secondary | ICD-10-CM | POA: Diagnosis not present

## 2018-03-21 ENCOUNTER — Ambulatory Visit (HOSPITAL_COMMUNITY)
Admission: RE | Admit: 2018-03-21 | Discharge: 2018-03-21 | Disposition: A | Discharge status: Home / Self Care | Payer: Medicare Other | Source: Ambulatory Visit | Attending: Family | Admitting: Family

## 2018-03-21 ENCOUNTER — Encounter: Payer: Self-pay | Admitting: Family

## 2018-03-21 ENCOUNTER — Other Ambulatory Visit: Payer: Self-pay

## 2018-03-21 ENCOUNTER — Ambulatory Visit (INDEPENDENT_AMBULATORY_CARE_PROVIDER_SITE_OTHER)
Admission: RE | Admit: 2018-03-21 | Discharge: 2018-03-21 | Disposition: A | Discharge status: Home / Self Care | Payer: Medicare Other | Source: Ambulatory Visit | Attending: Family | Admitting: Family

## 2018-03-21 ENCOUNTER — Ambulatory Visit (INDEPENDENT_AMBULATORY_CARE_PROVIDER_SITE_OTHER): Payer: Medicare Other | Admitting: Family

## 2018-03-21 VITALS — BP 158/84 | HR 66 | Temp 97.7°F | Resp 16 | Ht 61.5 in | Wt 158.0 lb

## 2018-03-21 DIAGNOSIS — I779 Disorder of arteries and arterioles, unspecified: Secondary | ICD-10-CM

## 2018-03-21 DIAGNOSIS — Z95828 Presence of other vascular implants and grafts: Secondary | ICD-10-CM

## 2018-03-21 DIAGNOSIS — I739 Peripheral vascular disease, unspecified: Secondary | ICD-10-CM | POA: Insufficient documentation

## 2018-03-21 DIAGNOSIS — I701 Atherosclerosis of renal artery: Secondary | ICD-10-CM

## 2018-03-21 DIAGNOSIS — Z87891 Personal history of nicotine dependence: Secondary | ICD-10-CM | POA: Diagnosis not present

## 2018-03-21 NOTE — Progress Notes (Signed)
[facility] [place]   CC: Follow up peripheral artery occlusive disease  History of Present Illness Stacy Erickson is a 83 y.o. female who hasstenosis at the origin of herleft renal artery with known right renal artery occlusion. She is also s/p saphenous vein graft in the left leg-femoral-popliteal-in 2005by Dr. Kellie Simmering.   At her6-5-18visit withDr. Early, herecommended discontinuation of renalduplex follow-up since she has not had any symptoms and has been stable. She will continue her noninvasive follow-up of her left femoral-popliteal bypass. She also has a diffuse peripheral vascular occlusive disease and askedabout a carotid duplex. She does not have any history of focal neurologic deficits but does have a diffuse disease.   She uses a cane to ambulate due to her balance being "off" and left hip pain; she does not seem to indicate claudication symptoms with walking. She denies any know history of stroke or TIA.  At today's visit she denies chest pain or dyspnea.    Diabetic: No Tobacco use: former smoker, quit in 2003, smoked x 35 years  Pt meds include: Statin :Yes Betablocker: No ASA: Yes Other anticoagulants/antiplatelets: no     Past Medical History:  Diagnosis Date  . Hyperlipidemia   . Hypertension   . Renal artery occlusion [facility])     Social History Social History   Tobacco Use  . Smoking status: Former Smoker    Packs/day: 2.00    Years: 35.00    Pack years: 70.00    Types: Cigarettes    Last attempt to quit: 10/13/2001    Years since quitting: 16.4  . Smokeless tobacco: Never Used  . Tobacco comment: nicotine patch uses.  Substance Use Topics  . Alcohol use: Yes    Alcohol/week: 14.0 standard drinks    Types: 14 Cans of beer per week  . Drug use: No    Family History Family History  Problem Relation Age of Onset  . Heart disease Father   . Cancer Sister   . Colon cancer Other 63  . Colon polyps Child   . Colon polyps  Child     Past Surgical History:  Procedure Laterality Date  . ABDOMINAL HYSTERECTOMY    . CATARACT EXTRACTION, BILATERAL  2015  . GANGLION CYST EXCISION    . JOINT REPLACEMENT     hip  . PR VEIN BYPASS GRAFT,AORTO-FEM-POP  2005   Left Fem=pop graft by Dr. Kellie Simmering    Allergies  Allergen Reactions  . Codeine Nausea And Vomiting  . Sulfa Antibiotics Itching and Nausea Only    Current Outpatient Medications  Medication Sig Dispense Refill  . Acetaminophen (TYLENOL EX ST ARTHRITIS PAIN PO) Take by mouth as needed.    Marland Kitchen aspirin 81 MG tablet Take 81 mg by mouth daily.    . Cholecalciferol (VITAMIN D3) 1000 UNITS CAPS Take 4,000 Units by mouth daily.     . Cyanocobalamin (B-12) 1000 MCG/ML KIT Inject as directed every 30 (thirty) days.    Marland Kitchen estradiol (CLIMARA - DOSED IN MG/24 HR) 0.025 mg/24hr Place 1 patch onto the skin once a week.    . furosemide (LASIX) 40 MG tablet Take 20 mg by mouth daily.     Marland Kitchen loratadine (CLARITIN) 10 MG tablet Take 10 mg by mouth daily.    . Multiple Vitamin (MULTIVITAMIN) capsule Take 1 capsule by mouth daily.    . nicotine (NICODERM CQ - DOSED IN MG/24 HOURS) 14 mg/24hr patch Place 1 patch onto the skin daily.    Marland Kitchen  omeprazole (PRILOSEC) 20 MG capsule Take 20 mg by mouth daily.    . simvastatin (ZOCOR) 20 MG tablet Take 20 mg by mouth every evening.    . valsartan (DIOVAN) 40 MG tablet 40 mg 1 day or 1 dose.     . vitamin B-12 (CYANOCOBALAMIN) 1000 MCG tablet Take 1,000 mcg by mouth daily.    Marland Kitchen doxazosin (CARDURA) 2 MG tablet Take 2 mg by mouth daily.    . irbesartan (AVAPRO) 75 MG tablet Take 75 mg by mouth daily.    Marland Kitchen LORazepam (ATIVAN) 0.5 MG tablet Take 0.5 mg by mouth at bedtime.     No current facility-administered medications for this visit.     ROS: See HPI for pertinent positives and negatives.   Physical Examination  Vitals:   03/21/18 1421  BP: (!) 158/84  Pulse: 66  Resp: 16  Temp: 97.7 F (36.5 C)  TempSrc: Oral  SpO2: 96%   Weight: 158 lb (71.7 kg)  Height: 5' 1.5" (1.562 m)   Body mass index is 29.37 kg/m.  General: A&O x 3, WDWN, female. Gait: using cane, steady HENT: No gross abnormalities.  Eyes: PERRLA. Pulmonary: Respirations are non labored, CTAB, good air movement in all fields Cardiac: regular rhythm, no detected murmur.         Carotid Bruits Right Left   Negative Negative   Radial pulses are 2+ palpable bilaterally   Adominal aortic pulse is not palpable                         VASCULAR EXAM: Extremities without ischemic changes, without Gangrene; without open wounds.                                                                                                          LE Pulses Right Left       FEMORAL  2+ palpable  2+ palpable        POPLITEAL  not palpable   not palpable       POSTERIOR TIBIAL  not palpable   not palpable        DORSALIS PEDIS      ANTERIOR TIBIAL not palpable  faintly palpable    Abdomen: soft, NT, no palpable masses. Skin: no rashes, no cellulitis, no ulcers noted. Musculoskeletal: no muscle wasting or atrophy.  Neurologic: A&O X 3; appropriate affect, Sensation is normal; MOTOR FUNCTION:  moving all extremities equally, motor strength 5/5 throughout. Speech is fluent/normal. CN 2-12 intact. Psychiatric: Thought content is normal, mood appropriate for clinical situation.    ASSESSMENT: Stacy Erickson is a 83 y.o. female who hasstenosis at the origin of herleft renal artery with known right renal artery occlusion. Her blood pressure has improved since her medications are being adjusted.    Dr. Donnetta Hutching recommendeddiscontinuation ofrenalduplex follow-upat her visit on 08-17-16,since she has not had any symptoms and has been stable. She is also s/p saphenous vein graft in the left leg-femoral-popliteal-in 2005.   She states both legs feel equal re feeling tired  walking up an incline, but no claudication symptoms walking on a level surface.   She  denies any known history of stroke or TIA; states she has a niece with identical medical problems who has significant stenosis of her carotid arteries.  Pt's carotid duplex in July 2018 demonstrated minimal bilateral ICA stenosis.     DATA  Left LE Arterial Duplex 03-21-18: No stenosis in the bypass graft, all biphasic waveforms.  Highest velocity is 375 cm/s at the inflow Inflow stenosis has increased compared to the duplex on 09-06-17, at which time the velocity was 224 cm/s.  ABI (Date: 03/21/2018):  R:   ABI: 0.85 (0.77 on 09-06-17),   PT: tri (was bi)  DP: bi  TBI:  0.62, toe pressure 121 (was 0.53)  L:   ABI: 0.82 (was 0.83),   PT: tri (was bi)  DP: bi  TBI: 0.61, toe pressure 119, (was 0.61) Improved ABI in the right to mild disease from moderate, tri and biphasic waveforms. Stable mild disease in the left with tri and biphasic waveforms.    Carotid Duplex (09/14/16): No significant stenosis of the common carotid arteries.  Bilateral proximal ICA with 1-39% stenosis. Bilateral ECA with >50% stenosis. Bilateral vertebral artery flow is antegrade.  Bilateral subclavian artery waveforms are normal. No prior exam for comparison.    PLAN:  Based on the patient's vascular studies and examination, and after discussing with Dr. Donnetta Hutching, pt will return to clinic in 6 months with left LE arterial duplex and ABI's. Carotid duplex in 2 years.   Walk at least 30 minutes daily total, in a safe environment. If this is not possible, then daily seated leg exercises.   I discussed in depth with the patient the nature of atherosclerosis, and emphasized the importance of maximal medical management including strict control of blood pressure, blood glucose, and lipid levels, obtaining regular exercise, and continued cessation of smoking.  The patient is aware that without maximal medical management the underlying atherosclerotic disease process will progress, limiting the  benefit of any interventions.  The patient was given information about PAD including signs, symptoms, treatment, what symptoms should prompt the patient to seek immediate medical care, and risk reduction measures to take.  Clemon Chambers, RN, MSN, FNP-C [facility] Phone: [phone]  Clinic MD: Bishop Dublin  03/21/18 2:31 PM

## 2018-03-21 NOTE — Patient Instructions (Signed)
Peripheral Vascular Disease  Peripheral vascular disease (PVD) is a disease of the blood vessels that are not part of your heart and brain. A simple term for PVD is poor circulation. In most cases, PVD narrows the blood vessels that carry blood from your heart to the rest of your body. This can reduce the supply of blood to your arms, legs, and internal organs, like your stomach or kidneys. However, PVD most often affects a person's lower legs and feet. Without treatment, PVD tends to get worse. PVD can also lead to acute ischemic limb. This is when an arm or leg suddenly cannot get enough blood. This is a medical emergency. Follow these instructions at home: Lifestyle  Do not use any products that contain nicotine or tobacco, such as cigarettes and e-cigarettes. If you need help quitting, ask your doctor.  Lose weight if you are overweight. Or, stay at a healthy weight as told by your doctor.  Eat a diet that is low in fat and cholesterol. If you need help, ask your doctor.  Exercise regularly. Ask your doctor for activities that are right for you. General instructions  Take over-the-counter and prescription medicines only as told by your doctor.  Take good care of your feet: ? Wear comfortable shoes that fit well. ? Check your feet often for any cuts or sores.  Keep all follow-up visits as told by your doctor This is important. Contact a doctor if:  You have cramps in your legs when you walk.  You have leg pain when you are at rest.  You have coldness in a leg or foot.  Your skin changes.  You are unable to get or have an erection (erectile dysfunction).  You have cuts or sores on your feet that do not heal. Get help right away if:  Your arm or leg turns cold, numb, and blue.  Your arms or legs become red, warm, swollen, painful, or numb.  You have chest pain.  You have trouble breathing.  You suddenly have weakness in your face, arm, or leg.  You become very  confused or you cannot speak.  You suddenly have a very bad headache.  You suddenly cannot see. Summary  Peripheral vascular disease (PVD) is a disease of the blood vessels.  A simple term for PVD is poor circulation. Without treatment, PVD tends to get worse.  Treatment may include exercise, low fat and low cholesterol diet, and quitting smoking. This information is not intended to replace advice given to you by your health care provider. Make sure you discuss any questions you have with your health care provider. Document Released: 05/26/2009 Document Revised: 04/08/2016 Document Reviewed: 04/08/2016 Elsevier Interactive Patient Education  2019 [employer] Inc[employer]  

## 2018-03-29 DIAGNOSIS — D0439 Carcinoma in situ of skin of other parts of face: Secondary | ICD-10-CM | POA: Diagnosis not present

## 2018-03-29 DIAGNOSIS — D485 Neoplasm of uncertain behavior of skin: Secondary | ICD-10-CM | POA: Diagnosis not present

## 2018-03-29 DIAGNOSIS — L570 Actinic keratosis: Secondary | ICD-10-CM | POA: Diagnosis not present

## 2018-04-04 DIAGNOSIS — E538 Deficiency of other specified B group vitamins: Secondary | ICD-10-CM | POA: Diagnosis not present

## 2018-04-21 DIAGNOSIS — L570 Actinic keratosis: Secondary | ICD-10-CM | POA: Diagnosis not present

## 2018-04-21 DIAGNOSIS — D0439 Carcinoma in situ of skin of other parts of face: Secondary | ICD-10-CM | POA: Diagnosis not present

## 2018-05-03 DIAGNOSIS — E538 Deficiency of other specified B group vitamins: Secondary | ICD-10-CM | POA: Diagnosis not present

## 2018-06-15 DIAGNOSIS — E538 Deficiency of other specified B group vitamins: Secondary | ICD-10-CM | POA: Diagnosis not present

## 2018-06-29 DIAGNOSIS — Z85828 Personal history of other malignant neoplasm of skin: Secondary | ICD-10-CM | POA: Diagnosis not present

## 2018-06-29 DIAGNOSIS — L814 Other melanin hyperpigmentation: Secondary | ICD-10-CM | POA: Diagnosis not present

## 2018-06-29 DIAGNOSIS — D692 Other nonthrombocytopenic purpura: Secondary | ICD-10-CM | POA: Diagnosis not present

## 2018-06-29 DIAGNOSIS — L918 Other hypertrophic disorders of the skin: Secondary | ICD-10-CM | POA: Diagnosis not present

## 2018-06-29 DIAGNOSIS — D1801 Hemangioma of skin and subcutaneous tissue: Secondary | ICD-10-CM | POA: Diagnosis not present

## 2018-08-10 DIAGNOSIS — E538 Deficiency of other specified B group vitamins: Secondary | ICD-10-CM | POA: Diagnosis not present

## 2018-09-06 DIAGNOSIS — E538 Deficiency of other specified B group vitamins: Secondary | ICD-10-CM | POA: Diagnosis not present

## 2018-09-20 ENCOUNTER — Other Ambulatory Visit: Payer: Self-pay

## 2018-09-20 DIAGNOSIS — I779 Disorder of arteries and arterioles, unspecified: Secondary | ICD-10-CM

## 2018-09-20 DIAGNOSIS — Z95828 Presence of other vascular implants and grafts: Secondary | ICD-10-CM

## 2018-09-21 ENCOUNTER — Ambulatory Visit (INDEPENDENT_AMBULATORY_CARE_PROVIDER_SITE_OTHER): Payer: Medicare Other | Admitting: Family

## 2018-09-21 ENCOUNTER — Ambulatory Visit (INDEPENDENT_AMBULATORY_CARE_PROVIDER_SITE_OTHER)
Admission: RE | Admit: 2018-09-21 | Discharge: 2018-09-21 | Disposition: A | Discharge status: Home / Self Care | Payer: Medicare Other | Source: Ambulatory Visit | Attending: Family | Admitting: Family

## 2018-09-21 ENCOUNTER — Other Ambulatory Visit: Payer: Self-pay

## 2018-09-21 ENCOUNTER — Ambulatory Visit (HOSPITAL_COMMUNITY)
Admission: RE | Admit: 2018-09-21 | Discharge: 2018-09-21 | Disposition: A | Discharge status: Home / Self Care | Payer: Medicare Other | Source: Ambulatory Visit | Attending: Family | Admitting: Family

## 2018-09-21 ENCOUNTER — Encounter: Payer: Self-pay | Admitting: Family

## 2018-09-21 VITALS — BP 185/100 | Temp 97.8°F | Resp 12 | Ht 62.0 in | Wt 154.2 lb

## 2018-09-21 DIAGNOSIS — Z87891 Personal history of nicotine dependence: Secondary | ICD-10-CM | POA: Diagnosis not present

## 2018-09-21 DIAGNOSIS — I779 Disorder of arteries and arterioles, unspecified: Secondary | ICD-10-CM

## 2018-09-21 DIAGNOSIS — I6523 Occlusion and stenosis of bilateral carotid arteries: Secondary | ICD-10-CM | POA: Diagnosis not present

## 2018-09-21 DIAGNOSIS — Z95828 Presence of other vascular implants and grafts: Secondary | ICD-10-CM | POA: Insufficient documentation

## 2018-09-21 NOTE — Progress Notes (Signed)
[facility] [place]   CC: Follow up peripheral artery occlusive disease  History of Present Illness Stacy Erickson is a 83 y.o. female who hasstenosis at the origin of herleft renal artery with known right renal artery occlusion. She is also s/p saphenous vein graft in the left leg-femoral-popliteal-in 2005by Dr. Kellie Simmering.   At her6-5-18visit withDr. Early, herecommended discontinuation of renalduplex follow-up since she has not had any symptoms and has been stable. She will continue her noninvasive follow-up of her left femoral-popliteal bypass. She also has a diffuse peripheral vascular occlusive disease and askedabout a carotid duplex. She does not have any history of focal neurologic deficits but does have a diffuse disease.   She uses a cane to ambulate due to her balance being "off" and left hip pain; she does not seem to indicate claudication symptoms with walking. She denies any know history of stroke or TIA.  At today's visit she denies chest pain or dyspnea.    Diabetic: No Tobacco use: former smoker, quit in 2003, smoked x 35 years  Pt meds include: Statin :Yes Betablocker: No ASA: Yes Other anticoagulants/antiplatelets: no    Past Medical History:  Diagnosis Date  . Hyperlipidemia   . Hypertension   . Renal artery occlusion [facility])     Social History Social History   Tobacco Use  . Smoking status: Former Smoker    Packs/day: 2.00    Years: 35.00    Pack years: 70.00    Types: Cigarettes    Quit date: 10/13/2001    Years since quitting: 16.9  . Smokeless tobacco: Never Used  . Tobacco comment: nicotine patch uses.  Substance Use Topics  . Alcohol use: Yes    Alcohol/week: 14.0 standard drinks    Types: 14 Cans of beer per week  . Drug use: No    Family History Family History  Problem Relation Age of Onset  . Heart disease Father   . Cancer Sister   . Colon cancer Other 38  . Colon polyps Child   . Colon polyps Child      Past Surgical History:  Procedure Laterality Date  . ABDOMINAL HYSTERECTOMY    . CATARACT EXTRACTION, BILATERAL  2015  . GANGLION CYST EXCISION    . JOINT REPLACEMENT     hip  . PR VEIN BYPASS GRAFT,AORTO-FEM-POP  2005   Left Fem=pop graft by Dr. Kellie Simmering    Allergies  Allergen Reactions  . Codeine Nausea And Vomiting  . Sulfa Antibiotics Itching and Nausea Only    Current Outpatient Medications  Medication Sig Dispense Refill  . Acetaminophen (TYLENOL EX ST ARTHRITIS PAIN PO) Take by mouth as needed.    Marland Kitchen aspirin 81 MG tablet Take 81 mg by mouth daily.    . Cholecalciferol (VITAMIN D3) 1000 UNITS CAPS Take 4,000 Units by mouth daily.     . Cyanocobalamin (B-12) 1000 MCG/ML KIT Inject as directed every 30 (thirty) days.    Marland Kitchen estradiol (CLIMARA - DOSED IN MG/24 HR) 0.025 mg/24hr Place 1 patch onto the skin once a week.    . furosemide (LASIX) 40 MG tablet Take 20 mg by mouth daily.     . irbesartan (AVAPRO) 75 MG tablet Take 75 mg by mouth daily.    Marland Kitchen loratadine (CLARITIN) 10 MG tablet Take 10 mg by mouth daily.    Marland Kitchen LORazepam (ATIVAN) 0.5 MG tablet Take 0.5 mg by mouth at bedtime.    . Multiple Vitamin (MULTIVITAMIN) capsule Take 1 capsule  by mouth daily.    . nicotine (NICODERM CQ - DOSED IN MG/24 HOURS) 14 mg/24hr patch Place 1 patch onto the skin daily.    Marland Kitchen omeprazole (PRILOSEC) 20 MG capsule Take 20 mg by mouth daily.    . simvastatin (ZOCOR) 20 MG tablet Take 20 mg by mouth every evening.    Marland Kitchen doxazosin (CARDURA) 2 MG tablet Take 2 mg by mouth daily.    . valsartan (DIOVAN) 40 MG tablet 40 mg 1 day or 1 dose.     . vitamin B-12 (CYANOCOBALAMIN) 1000 MCG tablet Take 1,000 mcg by mouth daily.     No current facility-administered medications for this visit.     ROS: See HPI for pertinent positives and negatives.   Physical Examination  Vitals:   09/21/18 1525 09/21/18 1527  BP: (!) 188/88 (!) 185/100  Resp: 12   Temp: 97.8 F (36.6 C)   TempSrc: Temporal    SpO2: 96%   Weight: 154 lb 3.2 oz (69.9 kg)   Height: '5\' 2"'  (1.575 m)    Body mass index is 28.2 kg/m.  General: A&O x 3, WDWN, female. Gait: using cane, slow steady HENT: No gross abnormalities.  Eyes: PERRLA. Pulmonary: Respirations are non labored, CTAB, good air movement in all fields Cardiac: regular rhythm, no detected murmur.         Carotid Bruits Right Left   Negative Negative   Radial pulses are 2+ palpable bilaterally   Adominal aortic pulse is not palpable                         VASCULAR EXAM: Extremities without ischemic changes, without Gangrene; without open wounds.                                                                                                         LE Pulses Right Left       FEMORAL  not palpable, seated in chair  2+ palpable        POPLITEAL  not palpable   not palpable       POSTERIOR TIBIAL  not palpable   not palpable        DORSALIS PEDIS      ANTERIOR TIBIAL not palpable  faintly palpable    Abdomen: soft, NT, no palpable masses. Skin: no rashes, no cellulitis, no ulcers noted. Musculoskeletal: no muscle wasting or atrophy. Enlarged joints of both hands.  Neurologic: A&O X 3; appropriate affect, Sensation is normal; MOTOR FUNCTION:  moving all extremities equally, motor strength 5/5 throughout. Speech is fluent/normal. CN 2-12 intact. Psychiatric: Thought content is normal, mood appropriate for clinical situation.    ASSESSMENT: Stacy Erickson is a 83 y.o. female who hasstenosis at the origin of herleft renal artery with known right renal artery occlusion. Her blood pressure has improved since her medications are being adjusted.    Dr. Donnetta Hutching recommendeddiscontinuation ofrenalduplex follow-upat her visit on 08-17-16,since she has not had any symptoms and has been stable. She is also s/p saphenous vein graft in  the left leg-femoral-popliteal-in 2005.   She states both legs feel equal re feeling tired walking up an  incline, but no claudication symptoms walking on a level surface.   She denies any known history of stroke or TIA; states she has a niece with identical medical problems who has significant stenosis of her carotid arteries.  Pt's carotid duplex in July 2018 demonstrated minimal bilateral ICA stenosis.   She feels she is less steady on her feet on irbesartan medication, and feels more tired in the afternoon.  Ans she feels she has been walking less.  Mild decline in bilateral ABI and quality of waveforms.  Mild stenosis at the inflow to the bypass graft left leg.   She walks about a mile daily in the course of her daily activities.    DATA  Left LE Arterial Duplex 09-21-18: Left Graft #1: +--------------------+--------+---------------+--------+--------+                     PSV cm/sStenosis       WaveformComments +--------------------+--------+---------------+--------+--------+ Inflow              239 (was 375 cm/s at the inflow on 03-21-18) 50-74% stenosisbiphasic         +--------------------+--------+---------------+--------+--------+ Proximal Anastomosis217                    biphasic         +--------------------+--------+---------------+--------+--------+ Proximal Graft      66                     biphasicdampened +--------------------+--------+---------------+--------+--------+ Mid Graft           51                     biphasic         +--------------------+--------+---------------+--------+--------+ Distal Graft        58                     biphasic         +--------------------+--------+---------------+--------+--------+ Distal Anastamosis  57                     biphasic         +--------------------+--------+---------------+--------+--------+ Outflow             90                     biphasic         +--------------------+--------+---------------+--------+--------+ Summary: Left: Patent femoral to popliteal bypass graft  with evidence of inflow disease (50 - 74%). Stable compared to the exam in June 2019, improved compared to the exam on 03-21-18.     ABI (Date: 09-21-2018): ABI Findings: +---------+------------------+-----+----------+--------+ Right    Rt Pressure (mmHg)IndexWaveform  Comment  +---------+------------------+-----+----------+--------+ Brachial 165                                       +---------+------------------+-----+----------+--------+ PTA      125               0.76 biphasic           +---------+------------------+-----+----------+--------+ DP       126               0.76 monophasicbrisk    +---------+------------------+-----+----------+--------+ Michael Litter  0.36 Abnormal           +---------+------------------+-----+----------+--------+  +---------+------------------+-----+--------+-------+ Left     Lt Pressure (mmHg)IndexWaveformComment +---------+------------------+-----+--------+-------+ Brachial 155                                    +---------+------------------+-----+--------+-------+ PTA      123               0.75 biphasic        +---------+------------------+-----+--------+-------+ DP       121               0.73 biphasic        +---------+------------------+-----+--------+-------+ Great Toe95                0.58 Abnormal        +---------+------------------+-----+--------+-------+  +-------+-----------+-----------+------------+------------+ ABI/TBIToday's ABIToday's TBIPrevious ABIPrevious TBI +-------+-----------+-----------+------------+------------+ Right  0.76       0.36       0.85        0.62         +-------+-----------+-----------+------------+------------+ Left   0.75       0.58       0.82        0.61         +-------+-----------+-----------+------------+------------+  Bilateral ABIs appear decreased. Right TBIs appear decreased.Decline in bilateral waveform  quality.  Summary: Right: Resting right ankle-brachial index indicates moderate right lower extremity arterial disease. The right toe-brachial index is abnormal. Left: Resting left ankle-brachial index indicates moderate left lower extremity arterial disease. The left toe-brachial index is abnormal.   Carotid Duplex (09/14/16): No significant stenosis of the common carotid arteries.  Bilateral proximal ICA with 1-39% stenosis. Bilateral ECA with >50% stenosis. Bilateral vertebral artery flow is antegrade.  Bilateral subclavian artery waveforms are normal. No prior exam for comparison.    PLAN:  Based on the patient's vascular studies and examination, pt will return to clinic in 9 months with left LE arterial duplex and ABI's. 2 years with carotid duplex.  Daily seated leg exercises demonstrated and discussed.  I advised her to notify us if she develops concerns re the circulation in her feet or legs.  I discussed in depth with the patient the nature of atherosclerosis, and emphasized the importance of maximal medical management including strict control of blood pressure, blood glucose, and lipid levels, obtaining regular exercise, and continued cessation of smoking.  The patient is aware that without maximal medical management the underlying atherosclerotic disease process will progress, limiting the benefit of any interventions.  The patient was given information about PAD including signs, symptoms, treatment, what symptoms should prompt the patient to seek immediate medical care, and risk reduction measures to take.  Clemon Chambers, RN, MSN, FNP-C [facility] Phone: [phone]  Clinic MD: Laqueta Due  09/21/18 3:30 PM

## 2018-09-21 NOTE — Patient Instructions (Signed)
Peripheral Vascular Disease  Peripheral vascular disease (PVD) is a disease of the blood vessels that are not part of your heart and brain. A simple term for PVD is poor circulation. In most cases, PVD narrows the blood vessels that carry blood from your heart to the rest of your body. This can reduce the supply of blood to your arms, legs, and internal organs, like your stomach or kidneys. However, PVD most often affects a person's lower legs and feet. Without treatment, PVD tends to get worse. PVD can also lead to acute ischemic limb. This is when an arm or leg suddenly cannot get enough blood. This is a medical emergency. Follow these instructions at home: Lifestyle  Do not use any products that contain nicotine or tobacco, such as cigarettes and e-cigarettes. If you need help quitting, ask your doctor.  Lose weight if you are overweight. Or, stay at a healthy weight as told by your doctor.  Eat a diet that is low in fat and cholesterol. If you need help, ask your doctor.  Exercise regularly. Ask your doctor for activities that are right for you. General instructions  Take over-the-counter and prescription medicines only as told by your doctor.  Take good care of your feet: ? Wear comfortable shoes that fit well. ? Check your feet often for any cuts or sores.  Keep all follow-up visits as told by your doctor This is important. Contact a doctor if:  You have cramps in your legs when you walk.  You have leg pain when you are at rest.  You have coldness in a leg or foot.  Your skin changes.  You are unable to get or have an erection (erectile dysfunction).  You have cuts or sores on your feet that do not heal. Get help right away if:  Your arm or leg turns cold, numb, and blue.  Your arms or legs become red, warm, swollen, painful, or numb.  You have chest pain.  You have trouble breathing.  You suddenly have weakness in your face, arm, or leg.  You become very  confused or you cannot speak.  You suddenly have a very bad headache.  You suddenly cannot see. Summary  Peripheral vascular disease (PVD) is a disease of the blood vessels.  A simple term for PVD is poor circulation. Without treatment, PVD tends to get worse.  Treatment may include exercise, low fat and low cholesterol diet, and quitting smoking. This information is not intended to replace advice given to you by your health care provider. Make sure you discuss any questions you have with your health care provider. Document Released: 05/26/2009 Document Revised: 02/11/2017 Document Reviewed: 04/08/2016 Elsevier Patient Education  2020 Elsevier Inc.  

## 2018-10-10 DIAGNOSIS — E538 Deficiency of other specified B group vitamins: Secondary | ICD-10-CM | POA: Diagnosis not present

## 2018-10-11 DIAGNOSIS — W57XXXA Bitten or stung by nonvenomous insect and other nonvenomous arthropods, initial encounter: Secondary | ICD-10-CM | POA: Diagnosis not present

## 2018-10-11 DIAGNOSIS — I10 Essential (primary) hypertension: Secondary | ICD-10-CM | POA: Diagnosis not present

## 2018-11-09 DIAGNOSIS — Z23 Encounter for immunization: Secondary | ICD-10-CM | POA: Diagnosis not present

## 2018-11-09 DIAGNOSIS — E538 Deficiency of other specified B group vitamins: Secondary | ICD-10-CM | POA: Diagnosis not present

## 2018-12-12 DIAGNOSIS — E538 Deficiency of other specified B group vitamins: Secondary | ICD-10-CM | POA: Diagnosis not present

## 2019-01-18 DIAGNOSIS — E538 Deficiency of other specified B group vitamins: Secondary | ICD-10-CM | POA: Diagnosis not present

## 2019-02-20 DIAGNOSIS — E538 Deficiency of other specified B group vitamins: Secondary | ICD-10-CM | POA: Diagnosis not present

## 2019-03-19 DIAGNOSIS — E7849 Other hyperlipidemia: Secondary | ICD-10-CM | POA: Diagnosis not present

## 2019-03-26 DIAGNOSIS — R2689 Other abnormalities of gait and mobility: Secondary | ICD-10-CM | POA: Diagnosis not present

## 2019-03-26 DIAGNOSIS — I701 Atherosclerosis of renal artery: Secondary | ICD-10-CM | POA: Diagnosis not present

## 2019-03-26 DIAGNOSIS — I10 Essential (primary) hypertension: Secondary | ICD-10-CM | POA: Diagnosis not present

## 2019-03-26 DIAGNOSIS — J449 Chronic obstructive pulmonary disease, unspecified: Secondary | ICD-10-CM | POA: Diagnosis not present

## 2019-03-26 DIAGNOSIS — R809 Proteinuria, unspecified: Secondary | ICD-10-CM | POA: Diagnosis not present

## 2019-03-26 DIAGNOSIS — I739 Peripheral vascular disease, unspecified: Secondary | ICD-10-CM | POA: Diagnosis not present

## 2019-03-26 DIAGNOSIS — M8580 Other specified disorders of bone density and structure, unspecified site: Secondary | ICD-10-CM | POA: Diagnosis not present

## 2019-03-26 DIAGNOSIS — Z1331 Encounter for screening for depression: Secondary | ICD-10-CM | POA: Diagnosis not present

## 2019-03-26 DIAGNOSIS — R05 Cough: Secondary | ICD-10-CM | POA: Diagnosis not present

## 2019-03-26 DIAGNOSIS — I440 Atrioventricular block, first degree: Secondary | ICD-10-CM | POA: Diagnosis not present

## 2019-03-26 DIAGNOSIS — E538 Deficiency of other specified B group vitamins: Secondary | ICD-10-CM | POA: Diagnosis not present

## 2019-03-26 DIAGNOSIS — N1831 Chronic kidney disease, stage 3a: Secondary | ICD-10-CM | POA: Diagnosis not present

## 2019-03-26 DIAGNOSIS — R3121 Asymptomatic microscopic hematuria: Secondary | ICD-10-CM | POA: Diagnosis not present

## 2019-03-26 DIAGNOSIS — R82998 Other abnormal findings in urine: Secondary | ICD-10-CM | POA: Diagnosis not present

## 2019-03-26 DIAGNOSIS — Z0000 Encounter for general adult medical examination without abnormal findings: Secondary | ICD-10-CM | POA: Diagnosis not present

## 2019-03-29 DIAGNOSIS — M8580 Other specified disorders of bone density and structure, unspecified site: Secondary | ICD-10-CM | POA: Diagnosis not present

## 2019-03-29 DIAGNOSIS — E538 Deficiency of other specified B group vitamins: Secondary | ICD-10-CM | POA: Diagnosis not present

## 2019-03-29 DIAGNOSIS — E7849 Other hyperlipidemia: Secondary | ICD-10-CM | POA: Diagnosis not present

## 2019-03-29 DIAGNOSIS — M1990 Unspecified osteoarthritis, unspecified site: Secondary | ICD-10-CM | POA: Diagnosis not present

## 2019-03-29 DIAGNOSIS — I10 Essential (primary) hypertension: Secondary | ICD-10-CM | POA: Diagnosis not present

## 2019-04-07 ENCOUNTER — Ambulatory Visit: Payer: Medicare Other | Attending: Internal Medicine

## 2019-04-07 DIAGNOSIS — Z23 Encounter for immunization: Secondary | ICD-10-CM

## 2019-04-07 NOTE — Progress Notes (Signed)
   Covid-19 Vaccination Clinic  Name:  Stacy Erickson    MRN: [id number] DOB: [birth year]  04/07/2019  [name] was observed post Covid-19 immunization for 15 minutes without incidence. She was provided with Vaccine Information Sheet and instruction to access the V-Safe system.   [name] was instructed to call 911 with any severe reactions post vaccine: Marland Kitchen Difficulty breathing  . Swelling of your face and throat  . A fast heartbeat  . A bad rash all over your body  . Dizziness and weakness    Immunizations Administered    Name Date Dose VIS Date Route   Pfizer COVID-19 Vaccine 04/07/2019  1:09 PM 0.3 mL 02/23/2019 Intramuscular   Manufacturer: [place]   Lot: [id number]   [place]: [id number]

## 2019-04-12 DIAGNOSIS — M25512 Pain in left shoulder: Secondary | ICD-10-CM | POA: Diagnosis not present

## 2019-04-12 DIAGNOSIS — M7542 Impingement syndrome of left shoulder: Secondary | ICD-10-CM | POA: Diagnosis not present

## 2019-04-28 ENCOUNTER — Ambulatory Visit: Payer: Medicare Other | Attending: Internal Medicine

## 2019-04-28 DIAGNOSIS — Z23 Encounter for immunization: Secondary | ICD-10-CM | POA: Insufficient documentation

## 2019-04-28 NOTE — Progress Notes (Signed)
   Covid-19 Vaccination Clinic  Name:  Stacy Erickson    MRN: [id number] DOB: 1935-06-06  04/28/2019  [name] was observed post Covid-19 immunization for 15 minutes without incidence. She was provided with Vaccine Information Sheet and instruction to access the V-Safe system.   [name] was instructed to call 911 with any severe reactions post vaccine: Marland Kitchen Difficulty breathing  . Swelling of your face and throat  . A fast heartbeat  . A bad rash all over your body  . Dizziness and weakness    Immunizations Administered    Name Date Dose VIS Date Route   Pfizer COVID-19 Vaccine 04/28/2019 12:27 PM 0.3 mL 02/23/2019 Intramuscular   Manufacturer: [place]   Lot: [id number]   [place]: [id number]

## 2019-05-01 DIAGNOSIS — E538 Deficiency of other specified B group vitamins: Secondary | ICD-10-CM | POA: Diagnosis not present

## 2019-06-12 DIAGNOSIS — E538 Deficiency of other specified B group vitamins: Secondary | ICD-10-CM | POA: Diagnosis not present

## 2019-07-18 DIAGNOSIS — E538 Deficiency of other specified B group vitamins: Secondary | ICD-10-CM | POA: Diagnosis not present

## 2019-08-22 DIAGNOSIS — E538 Deficiency of other specified B group vitamins: Secondary | ICD-10-CM | POA: Diagnosis not present

## 2019-09-25 DIAGNOSIS — I739 Peripheral vascular disease, unspecified: Secondary | ICD-10-CM | POA: Diagnosis not present

## 2019-09-25 DIAGNOSIS — I129 Hypertensive chronic kidney disease with stage 1 through stage 4 chronic kidney disease, or unspecified chronic kidney disease: Secondary | ICD-10-CM | POA: Diagnosis not present

## 2019-09-25 DIAGNOSIS — N183 Chronic kidney disease, stage 3 unspecified: Secondary | ICD-10-CM | POA: Diagnosis not present

## 2019-09-25 DIAGNOSIS — J449 Chronic obstructive pulmonary disease, unspecified: Secondary | ICD-10-CM | POA: Diagnosis not present

## 2019-09-25 DIAGNOSIS — D692 Other nonthrombocytopenic purpura: Secondary | ICD-10-CM | POA: Diagnosis not present

## 2019-09-25 DIAGNOSIS — G2581 Restless legs syndrome: Secondary | ICD-10-CM | POA: Diagnosis not present

## 2019-09-25 DIAGNOSIS — I701 Atherosclerosis of renal artery: Secondary | ICD-10-CM | POA: Diagnosis not present

## 2019-09-25 DIAGNOSIS — M8580 Other specified disorders of bone density and structure, unspecified site: Secondary | ICD-10-CM | POA: Diagnosis not present

## 2019-09-25 DIAGNOSIS — E538 Deficiency of other specified B group vitamins: Secondary | ICD-10-CM | POA: Diagnosis not present

## 2019-09-25 DIAGNOSIS — M25512 Pain in left shoulder: Secondary | ICD-10-CM | POA: Diagnosis not present

## 2019-09-25 DIAGNOSIS — R2689 Other abnormalities of gait and mobility: Secondary | ICD-10-CM | POA: Diagnosis not present

## 2019-09-25 DIAGNOSIS — K219 Gastro-esophageal reflux disease without esophagitis: Secondary | ICD-10-CM | POA: Diagnosis not present

## 2019-10-31 DIAGNOSIS — R260 Ataxic gait: Secondary | ICD-10-CM | POA: Diagnosis not present

## 2019-11-02 DIAGNOSIS — R260 Ataxic gait: Secondary | ICD-10-CM | POA: Diagnosis not present

## 2019-11-06 DIAGNOSIS — R260 Ataxic gait: Secondary | ICD-10-CM | POA: Diagnosis not present

## 2019-11-08 DIAGNOSIS — R260 Ataxic gait: Secondary | ICD-10-CM | POA: Diagnosis not present

## 2019-11-13 DIAGNOSIS — R260 Ataxic gait: Secondary | ICD-10-CM | POA: Diagnosis not present

## 2019-11-13 DIAGNOSIS — E538 Deficiency of other specified B group vitamins: Secondary | ICD-10-CM | POA: Diagnosis not present

## 2019-11-15 DIAGNOSIS — R260 Ataxic gait: Secondary | ICD-10-CM | POA: Diagnosis not present

## 2019-11-21 DIAGNOSIS — R260 Ataxic gait: Secondary | ICD-10-CM | POA: Diagnosis not present

## 2019-11-27 DIAGNOSIS — R260 Ataxic gait: Secondary | ICD-10-CM | POA: Diagnosis not present

## 2019-11-29 DIAGNOSIS — R260 Ataxic gait: Secondary | ICD-10-CM | POA: Diagnosis not present

## 2019-12-04 DIAGNOSIS — R260 Ataxic gait: Secondary | ICD-10-CM | POA: Diagnosis not present

## 2019-12-06 DIAGNOSIS — R260 Ataxic gait: Secondary | ICD-10-CM | POA: Diagnosis not present

## 2019-12-11 DIAGNOSIS — R260 Ataxic gait: Secondary | ICD-10-CM | POA: Diagnosis not present

## 2019-12-13 DIAGNOSIS — R260 Ataxic gait: Secondary | ICD-10-CM | POA: Diagnosis not present

## 2019-12-19 DIAGNOSIS — Z23 Encounter for immunization: Secondary | ICD-10-CM | POA: Diagnosis not present

## 2019-12-20 DIAGNOSIS — R260 Ataxic gait: Secondary | ICD-10-CM | POA: Diagnosis not present

## 2019-12-25 DIAGNOSIS — R260 Ataxic gait: Secondary | ICD-10-CM | POA: Diagnosis not present

## 2019-12-27 DIAGNOSIS — R260 Ataxic gait: Secondary | ICD-10-CM | POA: Diagnosis not present

## 2020-01-01 DIAGNOSIS — R260 Ataxic gait: Secondary | ICD-10-CM | POA: Diagnosis not present

## 2020-01-03 DIAGNOSIS — R260 Ataxic gait: Secondary | ICD-10-CM | POA: Diagnosis not present

## 2020-01-08 DIAGNOSIS — R260 Ataxic gait: Secondary | ICD-10-CM | POA: Diagnosis not present

## 2020-01-08 DIAGNOSIS — Z23 Encounter for immunization: Secondary | ICD-10-CM | POA: Diagnosis not present

## 2020-01-10 DIAGNOSIS — R260 Ataxic gait: Secondary | ICD-10-CM | POA: Diagnosis not present

## 2020-01-14 DIAGNOSIS — R260 Ataxic gait: Secondary | ICD-10-CM | POA: Diagnosis not present

## 2020-01-21 ENCOUNTER — Other Ambulatory Visit: Payer: Self-pay | Admitting: Surgery

## 2020-01-21 DIAGNOSIS — K439 Ventral hernia without obstruction or gangrene: Secondary | ICD-10-CM | POA: Diagnosis not present

## 2020-01-21 DIAGNOSIS — R1032 Left lower quadrant pain: Secondary | ICD-10-CM

## 2020-01-22 DIAGNOSIS — R260 Ataxic gait: Secondary | ICD-10-CM | POA: Diagnosis not present

## 2020-01-24 DIAGNOSIS — R260 Ataxic gait: Secondary | ICD-10-CM | POA: Diagnosis not present

## 2020-01-29 DIAGNOSIS — R260 Ataxic gait: Secondary | ICD-10-CM | POA: Diagnosis not present

## 2020-01-31 DIAGNOSIS — R260 Ataxic gait: Secondary | ICD-10-CM | POA: Diagnosis not present

## 2020-02-05 DIAGNOSIS — R260 Ataxic gait: Secondary | ICD-10-CM | POA: Diagnosis not present

## 2020-02-11 ENCOUNTER — Ambulatory Visit
Admission: RE | Admit: 2020-02-11 | Discharge: 2020-02-11 | Disposition: A | Discharge status: Home / Self Care | Payer: Medicare Other | Source: Ambulatory Visit | Attending: Surgery | Admitting: Surgery

## 2020-02-11 DIAGNOSIS — N281 Cyst of kidney, acquired: Secondary | ICD-10-CM | POA: Diagnosis not present

## 2020-02-11 DIAGNOSIS — R1032 Left lower quadrant pain: Secondary | ICD-10-CM

## 2020-02-11 DIAGNOSIS — N261 Atrophy of kidney (terminal): Secondary | ICD-10-CM | POA: Diagnosis not present

## 2020-02-11 DIAGNOSIS — K469 Unspecified abdominal hernia without obstruction or gangrene: Secondary | ICD-10-CM | POA: Diagnosis not present

## 2020-02-11 DIAGNOSIS — K439 Ventral hernia without obstruction or gangrene: Secondary | ICD-10-CM | POA: Diagnosis not present

## 2020-02-11 MED ORDER — IOPAMIDOL (ISOVUE-300) INJECTION 61%
100.0000 mL | Freq: Once | INTRAVENOUS | Status: AC | PRN
  Administered 2020-02-11: 100 mL via INTRAVENOUS

## 2020-02-12 DIAGNOSIS — R260 Ataxic gait: Secondary | ICD-10-CM | POA: Diagnosis not present

## 2020-02-14 DIAGNOSIS — R260 Ataxic gait: Secondary | ICD-10-CM | POA: Diagnosis not present

## 2020-02-15 ENCOUNTER — Ambulatory Visit: Payer: Self-pay | Admitting: Surgery

## 2020-02-15 DIAGNOSIS — K439 Ventral hernia without obstruction or gangrene: Secondary | ICD-10-CM | POA: Diagnosis not present

## 2020-02-15 NOTE — H&P (Signed)
History of Present Illness Stacy Erickson. Hoyle Barkdull MD; 02/15/2020 12:34 PM) The patient is a 84 year old female who presents with an abdominal wall hernia. Referred by Dr. Crist Infante for possible left lower quadrant hernia  This is an 84 year old female with multiple medical issues status post previous femoral popliteal bypass on the left by Dr. Kellie Simmering. She also had a left hip replacement. She has also had a vaginal hysterectomy. She presents with a long history over several years of a left lower quadrant mass/bulge. The patient states that this area seems to be getting larger and occasionally causes some discomfort. She feels that when she is relaxed and supine that the mass gets smaller. She has not had any imaging of this area in the last few years. She continues to have normal bowel movements.  CT scan showed a left lateral ventral hernia containing small bowel without sign of obstruction.  She comes in today for further discussion. She is accompanied by her daughter.  CLINICAL DATA: Left-sided abdominal pain and cramping for approximately 6 years.  EXAM: CT ABDOMEN AND PELVIS WITH CONTRAST  TECHNIQUE: Multidetector CT imaging of the abdomen and pelvis was performed using the standard protocol following bolus administration of intravenous contrast.  CONTRAST: 136mL ISOVUE-300 IOPAMIDOL (ISOVUE-300) INJECTION 61%  COMPARISON: Abdomen only CTA on 02/03/2015  FINDINGS: Lower Chest: No acute findings.  Hepatobiliary: No hepatic masses identified. Gallbladder is unremarkable. No evidence of biliary ductal dilatation.  Pancreas: No mass or inflammatory changes.  Spleen: Within normal limits in size and appearance.  Adrenals/Urinary Tract: Severe diffuse right renal parenchymal atrophy and tiny cysts remain stable. A tiny subcapsular cyst is also seen in the upper pole the left kidney, but there is no evidence of renal mass or hydronephrosis. Unremarkable unopacified urinary  bladder.  Stomach/Bowel: No evidence of obstruction, inflammatory process or abnormal fluid collections.  Vascular/Lymphatic: No pathologically enlarged lymph nodes. No abdominal aortic aneurysm. Aortic atherosclerotic calcification noted.  Reproductive: Prior hysterectomy noted. Adnexal regions are unremarkable in appearance.  Other: A small left lower quadrant ventral abdominal wall hernia is seen containing small bowel. No evidence of strangulation or obstruction.  Musculoskeletal: No suspicious bone lesions identified. Left hip prosthesis noted.  IMPRESSION: No acute findings within the abdomen or pelvis.  Small left lower quadrant ventral abdominal wall hernia containing small bowel. No evidence of strangulation or obstruction.  Stable severe right renal parenchymal atrophy.  Aortic Atherosclerosis (ICD10-I70.0).   Electronically Signed By: Marlaine Hind M.D. On: 02/11/2020 15:24   Problem List/Past Medical Rodman Key K. Kayhan Boardley, MD; 02/15/2020 12:34 PM) HERNIA, LATERAL VENTRAL (K43.9)  Past Surgical History (Yahia Bottger K. Kiven Vangilder, MD; 02/15/2020 12:34 PM) Bypass Surgery for Poor Blood Flow to Legs Cataract Surgery Bilateral. Hip Surgery Left. Hysterectomy (not due to cancer) - Partial  Diagnostic Studies History (Fontaine Hehl K. Nawaf Strange, MD; 02/15/2020 12:34 PM) Colonoscopy 5-10 years ago Mammogram >3 years ago Pap Smear >5 years ago  Allergies Darden Palmer, RMA; 02/15/2020 10:04 AM) CODEINE Sulfa Antibiotics Allergies Reconciled  Medication History Darden Palmer, RMA; 02/15/2020 10:04 AM) Furosemide (40MG  Tablet, Oral) Active. Omeprazole (20MG  Capsule DR, Oral) Active. Myrbetriq (50MG  Tablet ER 24HR, Oral) Active. Simvastatin (20MG  Tablet, Oral) Active. Valsartan (80MG  Tablet, Oral) Active. Medications Reconciled  Social History Stacy Erickson. Donya Hitch, MD; 02/15/2020 12:34 PM) Alcohol use Moderate alcohol use. Caffeine use Coffee. No drug  use Tobacco use Former smoker.  Family History Stacy Erickson. Mat Stuard, MD; 02/15/2020 12:34 PM) Arthritis Mother. Cancer Mother. Cerebrovascular Accident Father. Colon Polyps Daughter, Son.  Heart disease in female family member before age 68 Hypertension Daughter, Father, Sister, Son. Melanoma Mother. Migraine Headache Daughter. Thyroid problems Daughter, Sister.  Pregnancy / Birth History Stacy Erickson. Navil Kole, MD; 02/15/2020 12:34 PM) Age at menarche 17 years. Age of menopause <47 Gravida 3 Irregular periods Length (months) of breastfeeding 3-6 Maternal age 39-20 Para 3  Other Problems Stacy Erickson. Muskaan Smet, MD; 02/15/2020 12:34 PM) Arthritis Bladder Problems Hemorrhoids High blood pressure Hypercholesterolemia Melanoma Other disease, cancer, significant illness Vascular Disease    Vitals Darden Palmer RMA; 02/15/2020 10:05 AM) 02/15/2020 10:04 AM Weight: 139 lb Height: 60in Body Surface Area: 1.6 m Body Mass Index: 27.15 kg/m  Temp.: 97.87F  Pulse: 97 (Regular)  P.OX: 98% (Room air) BP: 128/84(Sitting, Left Arm, Standard)        Physical Exam Rodman Key K. Willye Javier MD; 02/15/2020 12:35 PM)  The physical exam findings are as follows: Note:Constitutional: WDWN in NAD, conversant, no obvious deformities; resting comfortably Eyes: Pupils equal, round; sclera anicteric; moist conjunctiva; no lid lag HENT: Oral mucosa moist; good dentition Neck: No masses palpated, trachea midline; no thyromegaly Lungs: CTA bilaterally; normal respiratory effort CV: Regular rate and rhythm; no murmurs; extremities well-perfused with no edema Abd: +bowel sounds, soft, protuberant with a very lax abdominal wall musculature. In the left lower quadrant, there is a visible bulge. When she is relaxed and supine this bulge does get smaller. I cannot palpate a fascial defect. There are no surgical scars in this area. Musc: Normal gait; no apparent clubbing or cyanosis  in extremities Lymphatic: No palpable cervical or axillary lymphadenopathy Skin: Warm, dry; no sign of jaundice Psychiatric - alert and oriented x 4; calm mood and affect    Assessment & Plan Rodman Key K. Nayshawn Mesta MD; 02/15/2020 12:33 PM)  HERNIA, LATERAL VENTRAL (K43.9)  Current Plans Schedule for Surgery - Open repair of lateral ventral hernia with mesh. The surgical procedure has been discussed with the patient. Potential risks, benefits, alternative treatments, and expected outcomes have been explained. All of the patient's questions at this time have been answered. The likelihood of reaching the patient's treatment goal is good. The patient understand the proposed surgical procedure and wishes to proceed.  Stacy Erickson. Georgette Dover, MD, [facility] Surgery  General/ Trauma Surgery   02/15/2020 12:35 PM

## 2020-02-19 DIAGNOSIS — R260 Ataxic gait: Secondary | ICD-10-CM | POA: Diagnosis not present

## 2020-02-21 DIAGNOSIS — R260 Ataxic gait: Secondary | ICD-10-CM | POA: Diagnosis not present

## 2020-02-25 DIAGNOSIS — R260 Ataxic gait: Secondary | ICD-10-CM | POA: Diagnosis not present

## 2020-02-28 DIAGNOSIS — R260 Ataxic gait: Secondary | ICD-10-CM | POA: Diagnosis not present

## 2020-03-03 DIAGNOSIS — R260 Ataxic gait: Secondary | ICD-10-CM | POA: Diagnosis not present

## 2020-03-10 NOTE — Pre-Procedure Instructions (Signed)
[facility] - [place], [place] - [place] [address] [place] [address] Phone: [phone] Fax: [phone]      Your procedure is scheduled on Wednesday, January 5th at 3:15p.m.  Report to [facility] Main Entrance "A" at 1:15 P.M., and check in at the Admitting office.  Call this number if you have problems the morning of surgery:  [phone]  Call [phone] if you have any questions prior to your surgery date Monday-Friday 8am-4pm    Remember:  Do not eat after midnight the night before your surgery  You may drink clear liquids until 12:15 the afternoon of your surgery.   Clear liquids allowed are: Water, Non-Citrus Juices (without pulp), Carbonated Beverages, Clear Tea, Black Coffee Only, and Gatorade.  Please complete your PRE-SURGERY ENSURE that was provided to you by 12:15 p.m. the afternoon of surgery.  Please, if able, drink it in one setting. DO NOT SIP.    Take these medicines the morning of surgery with A SIP OF WATER  loratadine (CLARITIN) omeprazole (PRILOSEC)  fluticasone (FLONASE) -as needed acetaminophen (TYLENOL)-as needed   As of today, STOP taking any Aspirin (unless otherwise instructed by your surgeon) Aleve, Naproxen, Ibuprofen, Motrin, Advil, Goody's, BC's, all herbal medications, fish oil, and all vitamins.                      Do not wear jewelry, make up, or nail polish            Do not wear lotions, powders, perfumes, or deodorant.            Do not shave 48 hours prior to surgery.             Do not bring valuables to the hospital.            [facility] is not responsible for any belongings or valuables.  Do NOT Smoke (Tobacco/Vaping) or drink Alcohol 24 hours prior to your procedure If you use a CPAP at night, you may bring all equipment for your overnight stay.   Contacts, glasses, dentures or bridgework may not be worn into surgery.      For patients admitted to the hospital, discharge time  will be determined by your treatment team.   Patients discharged the day of surgery will not be allowed to drive home, and someone needs to stay with them for 24 hours.    Special instructions:   [place]- Preparing For Surgery  Before surgery, you can play an important role. Because skin is not sterile, your skin needs to be as free of germs as possible. You can reduce the number of germs on your skin by washing with CHG (chlorahexidine gluconate) Soap before surgery.  CHG is an antiseptic cleaner which kills germs and bonds with the skin to continue killing germs even after washing.    Oral Hygiene is also important to reduce your risk of infection.  Remember - BRUSH YOUR TEETH THE MORNING OF SURGERY WITH YOUR REGULAR TOOTHPASTE  Please do not use if you have an allergy to CHG or antibacterial soaps. If your skin becomes reddened/irritated stop using the CHG.  Do not shave (including legs and underarms) for at least 48 hours prior to first CHG shower. It is OK to shave your face.  Please follow these instructions carefully.   1. Shower the NIGHT BEFORE SURGERY and the MORNING OF SURGERY with CHG Soap.   2. If you chose  to wash your hair, wash your hair first as usual with your normal shampoo.  3. After you shampoo, rinse your hair and body thoroughly to remove the shampoo.  4. Use CHG as you would any other liquid soap. You can apply CHG directly to the skin and wash gently with a scrungie or a clean washcloth.   5. Apply the CHG Soap to your body ONLY FROM THE NECK DOWN.  Do not use on open wounds or open sores. Avoid contact with your eyes, ears, mouth and genitals (private parts). Wash Face and genitals (private parts)  with your normal soap.   6. Wash thoroughly, paying special attention to the area where your surgery will be performed.  7. Thoroughly rinse your body with warm water from the neck down.  8. DO NOT shower/wash with your normal soap after using and rinsing off  the CHG Soap.  9. Pat yourself dry with a CLEAN TOWEL.  10. Wear CLEAN PAJAMAS to bed the night before surgery  11. Place CLEAN SHEETS on your bed the night of your first shower and DO NOT SLEEP WITH PETS.   Day of Surgery: Wear Clean/Comfortable clothing the morning of surgery Do not apply any deodorants/lotions.   Remember to brush your teeth WITH YOUR REGULAR TOOTHPASTE.   Please read over the following fact sheets that you were given.

## 2020-03-11 ENCOUNTER — Encounter (HOSPITAL_COMMUNITY): Payer: Self-pay

## 2020-03-11 ENCOUNTER — Encounter (HOSPITAL_COMMUNITY)
Admission: RE | Admit: 2020-03-11 | Discharge: 2020-03-11 | Disposition: A | Discharge status: Home / Self Care | Payer: Medicare Other | Source: Ambulatory Visit | Attending: Surgery | Admitting: Surgery

## 2020-03-11 ENCOUNTER — Other Ambulatory Visit: Payer: Self-pay

## 2020-03-11 DIAGNOSIS — Z01818 Encounter for other preprocedural examination: Secondary | ICD-10-CM | POA: Diagnosis not present

## 2020-03-11 HISTORY — DX: Squamous cell carcinoma of skin of right upper limb, including shoulder: C44.622

## 2020-03-11 HISTORY — DX: Unspecified malignant neoplasm of skin of nose: C44.301

## 2020-03-11 HISTORY — DX: Peripheral vascular disease, unspecified: I73.9

## 2020-03-11 HISTORY — DX: Gastro-esophageal reflux disease without esophagitis: K21.9

## 2020-03-11 LAB — CBC
HCT: 41 % (ref 36.0–46.0)
Hemoglobin: 12.9 g/dL (ref 12.0–15.0)
MCH: 30.7 pg (ref 26.0–34.0)
MCHC: 31.5 g/dL (ref 30.0–36.0)
MCV: 97.6 fL (ref 80.0–100.0)
Platelets: 229 10*3/uL (ref 150–400)
RBC: 4.2 MIL/uL (ref 3.87–5.11)
RDW: 13.2 % (ref 11.5–15.5)
WBC: 6.5 10*3/uL (ref 4.0–10.5)
nRBC: 0 % (ref 0.0–0.2)

## 2020-03-11 LAB — BASIC METABOLIC PANEL
Anion gap: 11 (ref 5–15)
BUN: 28 mg/dL — ABNORMAL HIGH (ref 8–23)
CO2: 23 mmol/L (ref 22–32)
Calcium: 9.9 mg/dL (ref 8.9–10.3)
Chloride: 98 mmol/L (ref 98–111)
Creatinine, Ser: 1.27 mg/dL — ABNORMAL HIGH (ref 0.44–1.00)
GFR, Estimated: 42 mL/min — ABNORMAL LOW (ref >60)
Glucose, Bld: 88 mg/dL (ref 70–99)
Potassium: 4.9 mmol/L (ref 3.5–5.1)
Sodium: 132 mmol/L — ABNORMAL LOW (ref 135–145)

## 2020-03-11 NOTE — Anesthesia Preprocedure Evaluation (Addendum)
Anesthesia Evaluation  Patient identified by MRN, date of birth, ID band Patient awake    Reviewed: Allergy & Precautions, NPO status , Patient's Chart, lab work & pertinent test results, reviewed documented beta blocker date and time   Airway Mallampati: II  TM Distance: >3 FB Neck ROM: Full    Dental  (+) Dental Advisory Given   Pulmonary former smoker,    breath sounds clear to auscultation       Cardiovascular hypertension, Pt. on medications and Pt. on home beta blockers + Peripheral Vascular Disease   Rhythm:Regular Rate:Normal     Neuro/Psych negative neurological ROS     GI/Hepatic Neg liver ROS, GERD  ,  Endo/Other  negative endocrine ROS  Renal/GU negative Renal ROS     Musculoskeletal   Abdominal   Peds  Hematology negative hematology ROS (+)   Anesthesia Other Findings   Reproductive/Obstetrics                            Anesthesia Physical Anesthesia Plan  ASA: III  Anesthesia Plan: General   Post-op Pain Management:    Induction: Intravenous  PONV Risk Score and Plan: 3 and Dexamethasone, Ondansetron and Treatment may vary due to age or medical condition  Airway Management Planned: Oral ETT  Additional Equipment:   Intra-op Plan:   Post-operative Plan: Extubation in OR  Informed Consent: I have reviewed the patients History and Physical, chart, labs and discussed the procedure including the risks, benefits and alternatives for the proposed anesthesia with the patient or authorized representative who has indicated his/her understanding and acceptance.     Dental advisory given  Plan Discussed with:   Anesthesia Plan Comments: (PAT note written by Shonna Chock, PA-C. )       Anesthesia Quick Evaluation

## 2020-03-11 NOTE — Progress Notes (Addendum)
Anesthesia PAT Evaluation:  Case: [id number] Date/Time: 03/19/20 1500   Procedure: OPEN VENTRAL HERNIA REPAIR WITH MESH (N/A )   Anesthesia type: General   Pre-op diagnosis: lateral ventral hernia   Location: [facility] OR ROOM 02 / [facility]   Surgeons: Donnie Mesa, MD      DISCUSSION: Patient is an 84 year old female scheduled for the above procedure.   History includes former smoker (quit 10/13/01), HLD, HTN, renal artery occlusion (1-59% left renal artery stenosis 08/17/16; known right RAS occlusion since at least 09/20/00), PAD (s/p left FPBG ~ 2005), skin cancer (right shoulder, nose). 1-39% BICA stenosis on 2018 carotid US.   I evaluated patient during her PAT visit due to elevated BP. Reportedly on arrival after walking down hospital hallway BP was 220/100 and then 207/83 on recheck a few minutes later. At end of PAT RN interview, BP 201/83 and then 200/98 with manual cuff by RN. She denied chest pain, SOB, headaches. Reported had another episode of elevated BP with a recent physical therapy treatment, otherwise typically reports readings ~ 160's/80's. She in on Valsartan 80 mg 1/2 BID and Lasix 40 mg 1/2 tab daily. She took valsartan 40 mg this morning, but did not take Lasix 20 mg since she would be outside of the house for several hours. She does not plan to take Lasix on 03/11/20 due to concern of being up all night voiding if she were to take it in the afternoon. She will take valsartan 40 mg when she arrives home (normally takes ~ 8 PM). Offered ED visit if she preferred, but since she was asymptomatic and had BP medication at home she desired to treat once at home. Discussed need for emergent evaluation should she become symptomatic, otherwise, I have left a message for medical assistant Estill Bamberg at Dr. Silvestre Mesi office to arrange follow-up with patient to discuss HTN. Patient will keep a home BP log in hopes that will help direct recommendations. She in on an ARB and diuretic which are typically held for  surgery--discussed with anesthesiologist Oren Bracket, MD--if another BP is not added that would be typically given on the day of surgery, then would recommend she come in 3 hours early to allow time to treat BP if needed. Will plan to follow-up with her in a few days. She knows to contact Dr. Silvestre Mesi office if someone does not get back with her about her BP.  Creatinine 1.27 (02/16/18 labs scanned under Media tab show Creatinine then of 1.2, BUN 17).    She received 2nd [place] COVID-19 vaccine on 04/28/19.  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   ADDENDUM 03/03/20 10:50 AM: I called to follow-up with patient. She says BP readings have not been as high. She is scheduled to see Dr. Joylene Draft on 03/17/20. She was notified to contact PAT if new medication added but discussed medications like b-blockers and CCB are allowed on the morning of surgery. If no changes made or just dose adjustments to her current ARB and/or diuretic then notified to arrive 3 hour prior to surgery per my previous discussion with Dr. Ola Spurr. She notes that she has not tolerated several classes of antihypertensives in the past.   ADDENDUM 03/18/2020 4:28 PM: Patient called back on 03/17/2020 and stated that her PCP had added nebivolol to her antihypertensive regimen.  She was instructed to take this medication on day of surgery.  VS: BP (!) 210/83   Pulse 85   Temp 36.8 C (Oral)   Resp 18   Ht 5' (1.524 m)   Wt 63 kg   SpO2 98%   BMI 27.11 kg/m  See DISCUSSION for BP readings. Per 02/15/20 note by DR. Tsuei, BP 128/84. Provider wore N95, universal face mask, and face shield. Patient wore face mask. Heart RRR, no murmur noted. Lungs clear. Walks with cane but is working with PT regularly. She reports what sounds like stable claudication symptoms in her LLE.    PROVIDERSCrist Infante, MD is PCP  - Last vascular surgery evaluation  09/21/18 by Clemon Chambers, NP. She notes that at 08/17/16 visit with Early, Sherren Mocha, MD, renal artery duplex surveillance had been discontinued as she was not having any symptoms and renal function remained stable. 9 month follow-up for LE arterial duplex recommended and 2 year follow-up for mild carotid disease. She initially saw Tinnie Gens, MD prior to his retirement. (We discussed scheduling routine follow-up, since she was last seen > 1 year ago.)    LABS: Labs reviewed: Acceptable for surgery. (all labs ordered are listed, but only abnormal results are displayed)  Labs Reviewed  BASIC METABOLIC PANEL - Abnormal; Notable for the following components:      Result Value   Sodium 132 (*)    BUN 28 (*)    Creatinine, Ser 1.27 (*)    GFR, Estimated 42 (*)    All other components within normal limits  CBC     IMAGES: CT Abd/pelvis 02/11/20: IMPRESSION: - No acute findings within the abdomen or pelvis. - Small left lower quadrant ventral abdominal wall hernia containing small bowel. No evidence of strangulation or obstruction. - Stable severe right renal parenchymal atrophy. - Aortic Atherosclerosis (ICD10-I70.0).   EKG: 03/11/20:  Sinus rhythm with 1st degree A-V block   CV: Carotid US 09/14/16: Impression:  Velocities in the bilateral proximal ICA are consistent with a 1-39% stenosis. Velocities in the bilateral external carotid arteries are consistent with a > 50% stenosis.   Renal Artery Duplex 08/17/16: Impression: 1 to 59% left renal artery stenosis. (Known right renal artery occlusion by angiogram 09/20/00 and 02/03/15 CTA.)   Past Medical History:  Diagnosis Date  . Acid reflux   . Hyperlipidemia   . Hypertension   . Renal artery occlusion ([place])   . Skin cancer of nose   . Squamous cell cancer of skin of right shoulder     Past Surgical History:  Procedure Laterality Date  . ABDOMINAL HYSTERECTOMY    . CATARACT EXTRACTION, BILATERAL  2015  . GANGLION CYST  EXCISION    . JOINT REPLACEMENT     hip  . PR VEIN BYPASS GRAFT,AORTO-FEM-POP  2005   Left Fem=pop graft by Dr. Kellie Simmering    MEDICATIONS: . acetaminophen (TYLENOL) 500 MG tablet  . Cholecalciferol (VITAMIN D3) 1000 UNITS CAPS  . Cyanocobalamin (B-12) 1000 MCG/ML KIT  . fluticasone (FLONASE) 50 MCG/ACT nasal spray  . furosemide (LASIX) 40 MG tablet  . ibuprofen (ADVIL) 200 MG tablet  . loratadine (CLARITIN) 10 MG tablet  . Menthol, Topical Analgesic, (ASPERCREME MAX ROLL-ON EX)  . Multiple Vitamin (MULTIVITAMIN) capsule  . nicotine (NICODERM CQ - DOSED IN MG/24 HOURS) 14 mg/24hr patch  . omeprazole (PRILOSEC) 20 MG capsule  . Probiotic Product (PROBIOTIC PO)  . simvastatin (ZOCOR) 20 MG tablet  . valsartan (DIOVAN) 80 MG tablet   No current facility-administered medications for this encounter.  Myra Gianotti, PA-C Surgical Short Stay/Anesthesiology [facility] Phone [phone] [facility] Phone [phone] 03/11/2020 5:36 PM

## 2020-03-11 NOTE — Progress Notes (Signed)
PCP - Dr. Rodrigo Ran - records requested Cardiologist - denies Vascular - Dr. Tawanna Cooler Early  PPM/ICD - denies   Chest x-ray - denies EKG - 03/11/2020 Stress Test - denies ECHO - denies Cardiac Cath - denies  Sleep Study - denies  CPAP - N/A  DM: denies   Blood Thinner Instructions: N/A Aspirin Instructions:N/A  ERAS Protcol - Yes PRE-SURGERY Ensure or G2- Ensure given  COVID TEST- Scheduled for 03/17/20. Patient verbalized understanding of self-quarantine instructions, appointment time and place.  Anesthesia review: YES, elevated BP at PAT appointment Upon arrival, patient BP was 220/100 and 207/83. Patient stated she felt fine and patient stated that sometimes with activity blood pressure goes up. BP rechecked at the end of appointment and it was 210/83. Another BP was rechecked manually it was 200/98. Revonda Standard, [place] aware, in to see patient.  Patient denies shortness of breath, fever, cough and chest pain at PAT appointment  All instructions explained to the patient, with a verbal understanding of the material. Patient agrees to go over the instructions while at home for a better understanding. Patient also instructed to self quarantine after being tested for COVID-19. The opportunity to ask questions was provided.

## 2020-03-17 ENCOUNTER — Other Ambulatory Visit (HOSPITAL_COMMUNITY)
Admission: RE | Admit: 2020-03-17 | Discharge: 2020-03-17 | Disposition: A | Discharge status: Home / Self Care | Payer: Medicare Other | Source: Ambulatory Visit | Attending: Surgery | Admitting: Surgery

## 2020-03-17 DIAGNOSIS — Z01812 Encounter for preprocedural laboratory examination: Secondary | ICD-10-CM | POA: Diagnosis not present

## 2020-03-17 DIAGNOSIS — I10 Essential (primary) hypertension: Secondary | ICD-10-CM | POA: Diagnosis not present

## 2020-03-17 DIAGNOSIS — J449 Chronic obstructive pulmonary disease, unspecified: Secondary | ICD-10-CM | POA: Diagnosis not present

## 2020-03-17 DIAGNOSIS — E538 Deficiency of other specified B group vitamins: Secondary | ICD-10-CM | POA: Diagnosis not present

## 2020-03-17 DIAGNOSIS — E785 Hyperlipidemia, unspecified: Secondary | ICD-10-CM | POA: Diagnosis not present

## 2020-03-17 DIAGNOSIS — K219 Gastro-esophageal reflux disease without esophagitis: Secondary | ICD-10-CM | POA: Diagnosis not present

## 2020-03-17 DIAGNOSIS — Z20822 Contact with and (suspected) exposure to covid-19: Secondary | ICD-10-CM | POA: Diagnosis not present

## 2020-03-17 DIAGNOSIS — K439 Ventral hernia without obstruction or gangrene: Secondary | ICD-10-CM | POA: Diagnosis not present

## 2020-03-17 DIAGNOSIS — R82998 Other abnormal findings in urine: Secondary | ICD-10-CM | POA: Diagnosis not present

## 2020-03-18 LAB — SARS CORONAVIRUS 2 (TAT 6-24 HRS): SARS Coronavirus 2: NEGATIVE

## 2020-03-19 ENCOUNTER — Other Ambulatory Visit: Payer: Self-pay

## 2020-03-19 ENCOUNTER — Inpatient Hospital Stay (HOSPITAL_COMMUNITY): Payer: Medicare Other | Admitting: Vascular Surgery

## 2020-03-19 ENCOUNTER — Encounter (HOSPITAL_COMMUNITY): Payer: Self-pay | Admitting: Surgery

## 2020-03-19 ENCOUNTER — Inpatient Hospital Stay (HOSPITAL_COMMUNITY): Payer: Medicare Other | Admitting: Anesthesiology

## 2020-03-19 ENCOUNTER — Encounter (HOSPITAL_COMMUNITY)
Admission: RE | Disposition: A | Discharge status: Home / Self Care | Payer: Self-pay | Source: Ambulatory Visit | Attending: Surgery

## 2020-03-19 ENCOUNTER — Ambulatory Visit (HOSPITAL_COMMUNITY)
Admission: RE | Admit: 2020-03-19 | Discharge: 2020-03-19 | Disposition: A | Discharge status: Home / Self Care | Payer: Medicare Other | Source: Ambulatory Visit | Attending: Surgery | Admitting: Surgery

## 2020-03-19 DIAGNOSIS — Z79899 Other long term (current) drug therapy: Secondary | ICD-10-CM | POA: Insufficient documentation

## 2020-03-19 DIAGNOSIS — K439 Ventral hernia without obstruction or gangrene: Secondary | ICD-10-CM | POA: Diagnosis not present

## 2020-03-19 DIAGNOSIS — Z96642 Presence of left artificial hip joint: Secondary | ICD-10-CM | POA: Diagnosis not present

## 2020-03-19 DIAGNOSIS — Z90711 Acquired absence of uterus with remaining cervical stump: Secondary | ICD-10-CM | POA: Insufficient documentation

## 2020-03-19 DIAGNOSIS — Z95820 Peripheral vascular angioplasty status with implants and grafts: Secondary | ICD-10-CM | POA: Diagnosis not present

## 2020-03-19 DIAGNOSIS — Z882 Allergy status to sulfonamides status: Secondary | ICD-10-CM | POA: Diagnosis not present

## 2020-03-19 HISTORY — PX: VENTRAL HERNIA REPAIR: SHX424

## 2020-03-19 HISTORY — PX: INSERTION OF MESH: SHX5868

## 2020-03-19 SURGERY — REPAIR, HERNIA, VENTRAL
Anesthesia: General | Site: Abdomen

## 2020-03-19 MED ORDER — BUPIVACAINE HCL (PF) 0.25 % IJ SOLN
INTRAMUSCULAR | Status: AC
  Filled 2020-03-19: qty 30

## 2020-03-19 MED ORDER — LACTATED RINGERS IV SOLN: INTRAVENOUS | Status: DC

## 2020-03-19 MED ORDER — HYDROCODONE-ACETAMINOPHEN 5-325 MG PO TABS: 1.0000 | ORAL_TABLET | Freq: Four times a day (QID) | ORAL | 0 refills | Status: DC | PRN

## 2020-03-19 MED ORDER — PROPOFOL 10 MG/ML IV BOLUS
INTRAVENOUS | Status: DC | PRN
  Administered 2020-03-19: 100 mg via INTRAVENOUS

## 2020-03-19 MED ORDER — LIDOCAINE 2% (20 MG/ML) 5 ML SYRINGE
INTRAMUSCULAR | Status: DC | PRN
  Administered 2020-03-19: 60 mg via INTRAVENOUS

## 2020-03-19 MED ORDER — PROPOFOL 10 MG/ML IV BOLUS
INTRAVENOUS | Status: AC
  Filled 2020-03-19: qty 20

## 2020-03-19 MED ORDER — PHENYLEPHRINE 40 MCG/ML (10ML) SYRINGE FOR IV PUSH (FOR BLOOD PRESSURE SUPPORT)
PREFILLED_SYRINGE | INTRAVENOUS | Status: DC | PRN
  Administered 2020-03-19 (×3): 80 ug via INTRAVENOUS

## 2020-03-19 MED ORDER — 0.9 % SODIUM CHLORIDE (POUR BTL) OPTIME
TOPICAL | Status: DC | PRN
  Administered 2020-03-19: 1000 mL

## 2020-03-19 MED ORDER — ROCURONIUM BROMIDE 10 MG/ML (PF) SYRINGE
PREFILLED_SYRINGE | INTRAVENOUS | Status: DC | PRN
  Administered 2020-03-19: 40 mg via INTRAVENOUS

## 2020-03-19 MED ORDER — SUGAMMADEX SODIUM 200 MG/2ML IV SOLN
INTRAVENOUS | Status: DC | PRN
  Administered 2020-03-19: 200 mg via INTRAVENOUS

## 2020-03-19 MED ORDER — HYDRALAZINE HCL 20 MG/ML IJ SOLN
INTRAMUSCULAR | Status: DC | PRN
  Administered 2020-03-19: 10 mg via INTRAVENOUS

## 2020-03-19 MED ORDER — CEFAZOLIN SODIUM-DEXTROSE 2-4 GM/100ML-% IV SOLN
2.0000 g | INTRAVENOUS | Status: AC
  Administered 2020-03-19: 2 g via INTRAVENOUS
  Filled 2020-03-19: qty 100

## 2020-03-19 MED ORDER — ACETAMINOPHEN 500 MG PO TABS: 1000.0000 mg | ORAL_TABLET | ORAL | Status: DC

## 2020-03-19 MED ORDER — ONDANSETRON HCL 4 MG/2ML IJ SOLN
INTRAMUSCULAR | Status: DC | PRN
  Administered 2020-03-19: 4 mg via INTRAVENOUS

## 2020-03-19 MED ORDER — ACETAMINOPHEN 500 MG PO TABS
1000.0000 mg | ORAL_TABLET | ORAL | Status: AC
  Administered 2020-03-19: 1000 mg via ORAL
  Filled 2020-03-19: qty 2

## 2020-03-19 MED ORDER — FENTANYL CITRATE (PF) 250 MCG/5ML IJ SOLN
INTRAMUSCULAR | Status: AC
  Filled 2020-03-19: qty 5

## 2020-03-19 MED ORDER — AMISULPRIDE (ANTIEMETIC) 5 MG/2ML IV SOLN: 10.0000 mg | Freq: Once | INTRAVENOUS | Status: DC | PRN

## 2020-03-19 MED ORDER — PHENYLEPHRINE HCL-NACL 10-0.9 MG/250ML-% IV SOLN
INTRAVENOUS | Status: DC | PRN
  Administered 2020-03-19: 30 ug/min via INTRAVENOUS

## 2020-03-19 MED ORDER — LABETALOL HCL 5 MG/ML IV SOLN
INTRAVENOUS | Status: DC | PRN
  Administered 2020-03-19: 10 mg via INTRAVENOUS

## 2020-03-19 MED ORDER — CHLORHEXIDINE GLUCONATE 0.12 % MT SOLN
15.0000 mL | Freq: Once | OROMUCOSAL | Status: AC
  Administered 2020-03-19: 15 mL via OROMUCOSAL
  Filled 2020-03-19: qty 15

## 2020-03-19 MED ORDER — ORAL CARE MOUTH RINSE: 15.0000 mL | Freq: Once | OROMUCOSAL | Status: AC

## 2020-03-19 MED ORDER — DEXAMETHASONE SODIUM PHOSPHATE 10 MG/ML IJ SOLN
INTRAMUSCULAR | Status: DC | PRN
  Administered 2020-03-19: 10 mg via INTRAVENOUS

## 2020-03-19 MED ORDER — GABAPENTIN 300 MG PO CAPS
300.0000 mg | ORAL_CAPSULE | ORAL | Status: AC
  Administered 2020-03-19: 300 mg via ORAL
  Filled 2020-03-19: qty 1

## 2020-03-19 MED ORDER — FENTANYL CITRATE (PF) 100 MCG/2ML IJ SOLN: 25.0000 ug | INTRAMUSCULAR | Status: DC | PRN

## 2020-03-19 MED ORDER — CHLORHEXIDINE GLUCONATE CLOTH 2 % EX PADS: 6.0000 | MEDICATED_PAD | Freq: Once | CUTANEOUS | Status: DC

## 2020-03-19 MED ORDER — FENTANYL CITRATE (PF) 250 MCG/5ML IJ SOLN
INTRAMUSCULAR | Status: DC | PRN
  Administered 2020-03-19 (×2): 50 ug via INTRAVENOUS

## 2020-03-19 MED ORDER — BUPIVACAINE HCL (PF) 0.25 % IJ SOLN
INTRAMUSCULAR | Status: DC | PRN
  Administered 2020-03-19: 10 mL

## 2020-03-19 SURGICAL SUPPLY — 40 items
APL PRP STRL LF DISP 70% ISPRP (MISCELLANEOUS)
APL SKNCLS STERI-STRIP NONHPOA (GAUZE/BANDAGES/DRESSINGS) ×1
BENZOIN TINCTURE PRP APPL 2/3 (GAUZE/BANDAGES/DRESSINGS) ×2 IMPLANT
BINDER ABDOMINAL 12 ML 46-62 (SOFTGOODS) ×2 IMPLANT
BLADE CLIPPER SURG (BLADE) IMPLANT
CANISTER SUCT 3000ML PPV (MISCELLANEOUS) ×2 IMPLANT
CHLORAPREP W/TINT 26 (MISCELLANEOUS) IMPLANT
COVER SURGICAL LIGHT HANDLE (MISCELLANEOUS) ×2 IMPLANT
COVER WAND RF STERILE (DRAPES) ×1 IMPLANT
DRAPE LAPAROSCOPIC ABDOMINAL (DRAPES) ×2 IMPLANT
DRSG TEGADERM 4X4.75 (GAUZE/BANDAGES/DRESSINGS) ×2 IMPLANT
ELECT CAUTERY BLADE 6.4 (BLADE) ×2 IMPLANT
ELECT REM PT RETURN 9FT ADLT (ELECTROSURGICAL) ×2
ELECTRODE REM PT RTRN 9FT ADLT (ELECTROSURGICAL) ×1 IMPLANT
GAUZE SPONGE 4X4 12PLY STRL (GAUZE/BANDAGES/DRESSINGS) ×2 IMPLANT
GLOVE BIO SURGEON STRL SZ7 (GLOVE) ×2 IMPLANT
GLOVE BIOGEL PI IND STRL 7.5 (GLOVE) ×1 IMPLANT
GLOVE BIOGEL PI INDICATOR 7.5 (GLOVE) ×1
GOWN STRL REUS W/ TWL LRG LVL3 (GOWN DISPOSABLE) ×2 IMPLANT
GOWN STRL REUS W/TWL LRG LVL3 (GOWN DISPOSABLE) ×4
KIT BASIN OR (CUSTOM PROCEDURE TRAY) ×2 IMPLANT
KIT TURNOVER KIT B (KITS) ×2 IMPLANT
MESH ULTRAPRO 3X6 7.6X15CM (Mesh General) ×1 IMPLANT
NDL HYPO 25GX1X1/2 BEV (NEEDLE) ×1 IMPLANT
NEEDLE HYPO 25GX1X1/2 BEV (NEEDLE) ×2 IMPLANT
NS IRRIG 1000ML POUR BTL (IV SOLUTION) ×2 IMPLANT
PACK GENERAL/GYN (CUSTOM PROCEDURE TRAY) ×2 IMPLANT
PAD ARMBOARD 7.5X6 YLW CONV (MISCELLANEOUS) ×4 IMPLANT
PENCIL SMOKE EVACUATOR (MISCELLANEOUS) ×2 IMPLANT
STRIP CLOSURE SKIN 1/2X4 (GAUZE/BANDAGES/DRESSINGS) ×2 IMPLANT
SUT MNCRL AB 4-0 PS2 18 (SUTURE) ×2 IMPLANT
SUT NOVA NAB DX-16 0-1 5-0 T12 (SUTURE) ×2 IMPLANT
SUT NOVA NAB GS-21 0 18 T12 DT (SUTURE) IMPLANT
SUT VIC AB 3-0 SH 27 (SUTURE)
SUT VIC AB 3-0 SH 27XBRD (SUTURE) ×1 IMPLANT
SUT VICRYL 0 27 CT2 27 ABS (SUTURE) ×2 IMPLANT
SYR CONTROL 10ML LL (SYRINGE) ×2 IMPLANT
TOWEL GREEN STERILE (TOWEL DISPOSABLE) ×2 IMPLANT
TOWEL GREEN STERILE FF (TOWEL DISPOSABLE) ×2 IMPLANT
TRAY FOLEY MTR SLVR 14FR STAT (SET/KITS/TRAYS/PACK) IMPLANT

## 2020-03-19 NOTE — Op Note (Signed)
Ventral Hernia Repair with mesh (open) Procedure Note  Indications: This is an 85 year old female with multiple medical issues status post previous femoral popliteal bypass on the left by Dr. Hart Rochester. She also had a left hip replacement. She has also had a vaginal hysterectomy. She presents with a long history over several years of a left lower quadrant mass/bulge. The patient states that this area seems to be getting larger and occasionally causes some discomfort. She feels that when she is relaxed and supine that the mass gets smaller. She continues to have normal bowel movements.  CT scan showed a left lateral ventral hernia containing small bowel without sign of obstruction.   Pre-operative Diagnosis: Left lateral ventral hernia Post-operative Diagnosis: same  Surgeon: Wynona Luna   Assistants: None  Anesthesia: General endotracheal anesthesia  ASA Class: 2  Procedure Details  The patient was seen again in the Holding Room. The risks, benefits, complications, treatment options, and expected outcomes were discussed with the patient. The possibilities of reaction to medication, pulmonary aspiration, perforation of viscus, bleeding, recurrent infection, the need for additional procedures, and development of a complication requiring transfusion or further operation were discussed with the patient and/or family. The likelihood of success in repairing the hernia and returning the patient to their previous functional status is good.  There was concurrence with the proposed plan, and informed consent was obtained. The site of surgery was properly noted/marked. The patient was taken to the Operating Room, identified as Stacy Erickson, and the procedure verified as left lateral ventral hernia repair. A Time Out was held and the above information confirmed.  The patient was placed in the supine position and underwent induction of anesthesia. The lower abdomen and groin was prepped with Chloraprep  and draped in the standard fashion, and 0.25% Marcaine with epinephrine was used to anesthetize the skin over the palpable mass in the LLQ.   A transverse incision was made. Dissection was carried down through the subcutaneous tissue with cautery to hernia sac.  I dissected the hernia sac free from the surrounding muscle.  She has a large fascial defect extending from the pubic tubercle to the anterior superior iliac spine.  We enlarged the incision.  I dissected the hernia sac circumferentially and reduced the hernia sac.  The fascial edges were reapproximated with a running 0 Vicryl.  I used a 3x6 inch Ultrapro mesh cut into an oval. The mesh was secured to the pubic tubercle and the underlying fascia with 0 Vicryl interrupted suture.  The external oblique fascia was closed over the mesh with 0 Vicryl.  3-0 Vicryl was used to close the subcutaneous tissues and 4-0 Monocryl was used to close the skin in subcuticular fashion.  Benzoin and steri-strips were used to seal the incision.  A clean dressing was applied.  The patient was then extubated and brought to the recovery room in stable condition.  All sponge, instrument, and needle counts were correct prior to closure and at the conclusion of the case.   Estimated Blood Loss: Minimal                 Complications: None; patient tolerated the procedure well.         Disposition: PACU - hemodynamically stable.         Condition: stable  Wilmon Arms. Corliss Skains, MD, [facility]  General/ Trauma Surgery   03/19/2020 3:33 PM

## 2020-03-19 NOTE — Anesthesia Procedure Notes (Signed)
Procedure Name: Intubation Date/Time: 03/19/2020 2:21 PM Performed by: Griffin Dakin, CRNA Pre-anesthesia Checklist: Patient identified, Emergency Drugs available, Suction available and Patient being monitored Patient Re-evaluated:Patient Re-evaluated prior to induction Oxygen Delivery Method: Circle system utilized Preoxygenation: Pre-oxygenation with 100% oxygen Induction Type: IV induction Ventilation: Mask ventilation without difficulty and Oral airway inserted - appropriate to patient size Laryngoscope Size: Mac and 4 Grade View: Grade I Tube type: Oral Tube size: 7.0 mm Number of attempts: 1 Airway Equipment and Method: Stylet and Oral airway Placement Confirmation: ETT inserted through vocal cords under direct vision,  positive ETCO2 and breath sounds checked- equal and bilateral Secured at: 22 cm Tube secured with: Tape Dental Injury: Teeth and Oropharynx as per pre-operative assessment

## 2020-03-19 NOTE — Transfer of Care (Signed)
Immediate Anesthesia Transfer of Care Note  Patient: Stacy Erickson  Procedure(s) Performed: OPEN VENTRAL HERNIA REPAIR WITH MESH (N/A Abdomen) INSERTION OF MESH (N/A Abdomen)  Patient Location: PACU  Anesthesia Type:General  Level of Consciousness: awake, alert  and oriented  Airway & Oxygen Therapy: Patient Spontanous Breathing  Post-op Assessment: Report given to RN and Post -op Vital signs reviewed and stable  Post vital signs: Reviewed and stable  Last Vitals:  Vitals Value Taken Time  BP 142/61 03/19/20 1537  Temp    Pulse 69 03/19/20 1542  Resp 16 03/19/20 1542  SpO2 95 % 03/19/20 1542  Vitals shown include unvalidated device data.  Last Pain:  Vitals:   03/19/20 1309  TempSrc:   PainSc: 0-No pain         Complications: No complications documented.

## 2020-03-19 NOTE — Discharge Instructions (Signed)
[facility] _______[facility] ° °UMBILICAL OR INGUINAL HERNIA REPAIR: POST OP INSTRUCTIONS ° °Always review your discharge instruction sheet given to you by the facility where your surgery was performed. °IF YOU HAVE DISABILITY OR FAMILY LEAVE FORMS, YOU MUST BRING THEM TO THE OFFICE FOR PROCESSING.   °DO NOT GIVE THEM TO YOUR DOCTOR. ° °1. A  prescription for pain medication may be given to you upon discharge.  Take your pain medication as prescribed, if needed.  If narcotic pain medicine is not needed, then you may take acetaminophen (Tylenol) or ibuprofen (Advil) as needed. °2. Take your usually prescribed medications unless otherwise directed. °If you need a refill on your pain medication, please contact your pharmacy.  They will contact our office to request authorization. Prescriptions will not be filled after 5 pm or on week-ends. °3. You should follow a light diet the first 24 hours after arrival home, such as soup and crackers, etc.  Be sure to include lots of fluids daily.  Resume your normal diet the day after surgery. °4.Most patients will experience some swelling and bruising around the umbilicus or in the groin and scrotum.  Ice packs and reclining will help.  Swelling and bruising can take several days to resolve.  °6. It is common to experience some constipation if taking pain medication after surgery.  Increasing fluid intake and taking a stool softener (such as Colace) will usually help or prevent this problem from occurring.  A mild laxative (Milk of Magnesia or Miralax) should be taken according to package directions if there are no bowel movements after 48 hours. °7. Unless discharge instructions indicate otherwise, you may remove your bandages 24-48 hours after surgery, and you may shower at that time.  You may have steri-strips (small skin tapes) in place directly over the incision.  These strips should be left on the skin for 7-10 days.  If your surgeon used skin glue on the  incision, you may shower in 24 hours.  The glue will flake off over the next 2-3 weeks.  Any sutures or staples will be removed at the office during your follow-up visit. °8. ACTIVITIES:  You may resume regular (light) daily activities beginning the next day--such as daily self-care, walking, climbing stairs--gradually increasing activities as tolerated.  You may have sexual intercourse when it is comfortable.  Refrain from any heavy lifting or straining until approved by your doctor. ° °a.You may drive when you are no longer taking prescription pain medication, you can comfortably wear a seatbelt, and you can safely maneuver your car and apply brakes. °b.RETURN TO WORK:   °_____________________________________________ ° °9.You should see your doctor in the office for a follow-up appointment approximately 2-3 weeks after your surgery.  Make sure that you call for this appointment within a day or two after you arrive home to insure a convenient appointment time. °10.OTHER INSTRUCTIONS: _________________________ °   _____________________________________ ° °WHEN TO CALL YOUR DOCTOR: °1. Fever over 101.0 °2. Inability to urinate °3. Nausea and/or vomiting °4. Extreme swelling or bruising °5. Continued bleeding from incision. °6. Increased pain, redness, or drainage from the incision ° °The clinic staff is available to answer your questions during regular business hours.  Please don’t hesitate to call and ask to speak to one of the nurses for clinical concerns.  If you have a medical emergency, go to the nearest emergency room or call 911.  A surgeon from [facility] is always on call at the hospital ° ° °  [address], [address], [place], [place]  [address] ? ° [address] [address], [place], [place]   [address] °[phone] ? [phone] ? FAX [phone] °Web site: [email or web address] °

## 2020-03-19 NOTE — H&P (Signed)
History of Present Illness The patient is a 85 year old female who presents with an abdominal wall hernia. Referred by Dr. Rodrigo Ran for possible left lower quadrant hernia  This is an 85 year old female with multiple medical issues status post previous femoral popliteal bypass on the left by Dr. Hart Rochester. She also had a left hip replacement. She has also had a vaginal hysterectomy. She presents with a long history over several years of a left lower quadrant mass/bulge. The patient states that this area seems to be getting larger and occasionally causes some discomfort. She feels that when she is relaxed and supine that the mass gets smaller. She has not had any imaging of this area in the last few years. She continues to have normal bowel movements.  CT scan showed a left lateral ventral hernia containing small bowel without sign of obstruction.  She comes in today for further discussion. She is accompanied by her daughter.  CLINICAL DATA: Left-sided abdominal pain and cramping for approximately 6 years.  EXAM: CT ABDOMEN AND PELVIS WITH CONTRAST  TECHNIQUE: Multidetector CT imaging of the abdomen and pelvis was performed using the standard protocol following bolus administration of intravenous contrast.  CONTRAST: ISOVUE-300 IOPAMIDOL (ISOVUE-300) INJECTION 61%  COMPARISON: Abdomen only CTA on 02/03/2015  FINDINGS: Lower Chest: No acute findings.  Hepatobiliary: No hepatic masses identified. Gallbladder is unremarkable. No evidence of biliary ductal dilatation.  Pancreas: No mass or inflammatory changes.  Spleen: Within normal limits in size and appearance.  Adrenals/Urinary Tract: Severe diffuse right renal parenchymal atrophy and tiny cysts remain stable. A tiny subcapsular cyst is also seen in the upper pole the left kidney, but there is no evidence of renal mass or hydronephrosis. Unremarkable unopacified urinary bladder.  Stomach/Bowel:  No evidence of obstruction, inflammatory process or abnormal fluid collections.  Vascular/Lymphatic: No pathologically enlarged lymph nodes. No abdominal aortic aneurysm. Aortic atherosclerotic calcification noted.  Reproductive: Prior hysterectomy noted. Adnexal regions are unremarkable in appearance.  Other: A small left lower quadrant ventral abdominal wall hernia is seen containing small bowel. No evidence of strangulation or obstruction.  Musculoskeletal: No suspicious bone lesions identified. Left hip prosthesis noted.  IMPRESSION: No acute findings within the abdomen or pelvis.  Small left lower quadrant ventral abdominal wall hernia containing small bowel. No evidence of strangulation or obstruction.  Stable severe right renal parenchymal atrophy.  Aortic Atherosclerosis (ICD10-I70.0).   Electronically Signed By: Danae Orleans M.D. On: 02/11/2020 15:24   Problem List/Past Medical  HERNIA, LATERAL VENTRAL (K43.9)  Past Surgical History  Bypass Surgery for Poor Blood Flow to Legs Cataract Surgery Bilateral. Hip Surgery Left. Hysterectomy (not due to cancer) - Partial  Diagnostic Studies History  Colonoscopy 5-10 years ago Mammogram >3 years ago Pap Smear >5 years ago  Allergies CODEINE Sulfa Antibiotics Allergies Reconciled  Medication History  Furosemide (40MG  Tablet, Oral) Active. Omeprazole (20MG  Capsule DR, Oral) Active. Myrbetriq (50MG  Tablet ER 24HR, Oral) Active. Simvastatin (20MG  Tablet, Oral) Active. Valsartan (80MG  Tablet, Oral) Active. Medications Reconciled  Social History  Alcohol use Moderate alcohol use. Caffeine use Coffee. No drug use Tobacco use Former smoker.  Family History  Arthritis Mother. Cancer Mother. Cerebrovascular Accident Father. Colon Polyps Daughter, Son. Heart disease in female family member before age 39 Hypertension Daughter, Father, Sister, Son. Melanoma  Mother. Migraine Headache Daughter. Thyroid problems Daughter, Sister.  Pregnancy / Birth History Age at menarche 14 years. Age of menopause <47 Gravida 3 Irregular periods Length (months) of breastfeeding 3-6 Maternal age  15-20 Para 3  Other Problems  Arthritis Bladder Problems Hemorrhoids High blood pressure Hypercholesterolemia Melanoma Other disease, cancer, significant illness Vascular Disease    Vitals  Weight: 139 lb Height: 60in Body Surface Area: 1.6 m Body Mass Index: 27.15 kg/m  Temp.: 97.45F  Pulse: 97 (Regular)  P.OX: 98% (Room air) BP: 128/84(Sitting, Left Arm, Standard)        Physical Exam   The physical exam findings are as follows: Note:Constitutional: WDWN in NAD, conversant, no obvious deformities; resting comfortably Eyes: Pupils equal, round; sclera anicteric; moist conjunctiva; no lid lag HENT: Oral mucosa moist; good dentition Neck: No masses palpated, trachea midline; no thyromegaly Lungs: CTA bilaterally; normal respiratory effort CV: Regular rate and rhythm; no murmurs; extremities well-perfused with no edema Abd: +bowel sounds, soft, protuberant with a very lax abdominal wall musculature. In the left lower quadrant, there is a visible bulge. When she is relaxed and supine this bulge does get smaller. I cannot palpate a fascial defect. There are no surgical scars in this area. Musc: Normal gait; no apparent clubbing or cyanosis in extremities Lymphatic: No palpable cervical or axillary lymphadenopathy Skin: Warm, dry; no sign of jaundice Psychiatric - alert and oriented x 4; calm mood and affect    Assessment & Plan   HERNIA, LATERAL VENTRAL (K43.9)  Current Plans Schedule for Surgery - Open repair of lateral ventral hernia with mesh. The surgical procedure has been discussed with the patient. Potential risks, benefits, alternative treatments, and expected outcomes have been  explained. All of the patient's questions at this time have been answered. The likelihood of reaching the patient's treatment goal is good. The patient understand the proposed surgical procedure and wishes to proceed.   Wilmon Arms. Corliss Skains, MD, [facility]  General/ Trauma Surgery   03/19/2020 1:33 PM

## 2020-03-20 ENCOUNTER — Encounter (HOSPITAL_COMMUNITY): Payer: Self-pay | Admitting: Surgery

## 2020-03-20 NOTE — Anesthesia Postprocedure Evaluation (Signed)
Anesthesia Post Note  Patient: Pharmacist, hospital  Procedure(s) Performed: OPEN VENTRAL HERNIA REPAIR WITH MESH (N/A Abdomen) INSERTION OF MESH (N/A Abdomen)     Patient location during evaluation: PACU Anesthesia Type: General Level of consciousness: awake and alert Pain management: pain level controlled Vital Signs Assessment: post-procedure vital signs reviewed and stable Respiratory status: spontaneous breathing, nonlabored ventilation, respiratory function stable and patient connected to nasal cannula oxygen Cardiovascular status: blood pressure returned to baseline and stable Postop Assessment: no apparent nausea or vomiting Anesthetic complications: no   No complications documented.  Last Vitals:  Vitals:   03/19/20 1551 03/19/20 1606  BP: (!) 142/76 (!) 151/71  Pulse: 70 72  Resp: 17 17  Temp:  36.8 C  SpO2: 98% 98%    Last Pain:  Vitals:   03/19/20 1606  TempSrc:   PainSc: 0-No pain                 Kennieth Rad

## 2020-03-28 DIAGNOSIS — I10 Essential (primary) hypertension: Secondary | ICD-10-CM | POA: Diagnosis not present

## 2020-03-28 DIAGNOSIS — I739 Peripheral vascular disease, unspecified: Secondary | ICD-10-CM | POA: Diagnosis not present

## 2020-03-28 DIAGNOSIS — Z1331 Encounter for screening for depression: Secondary | ICD-10-CM | POA: Diagnosis not present

## 2020-03-28 DIAGNOSIS — I701 Atherosclerosis of renal artery: Secondary | ICD-10-CM | POA: Diagnosis not present

## 2020-03-28 DIAGNOSIS — M8580 Other specified disorders of bone density and structure, unspecified site: Secondary | ICD-10-CM | POA: Diagnosis not present

## 2020-03-28 DIAGNOSIS — Z0000 Encounter for general adult medical examination without abnormal findings: Secondary | ICD-10-CM | POA: Diagnosis not present

## 2020-03-28 DIAGNOSIS — J449 Chronic obstructive pulmonary disease, unspecified: Secondary | ICD-10-CM | POA: Diagnosis not present

## 2020-03-28 DIAGNOSIS — R2689 Other abnormalities of gait and mobility: Secondary | ICD-10-CM | POA: Diagnosis not present

## 2020-03-28 DIAGNOSIS — M1990 Unspecified osteoarthritis, unspecified site: Secondary | ICD-10-CM | POA: Diagnosis not present

## 2020-03-28 DIAGNOSIS — N1832 Chronic kidney disease, stage 3b: Secondary | ICD-10-CM | POA: Diagnosis not present

## 2020-03-28 DIAGNOSIS — M25512 Pain in left shoulder: Secondary | ICD-10-CM | POA: Diagnosis not present

## 2020-03-28 DIAGNOSIS — R809 Proteinuria, unspecified: Secondary | ICD-10-CM | POA: Diagnosis not present

## 2020-04-29 DIAGNOSIS — R2689 Other abnormalities of gait and mobility: Secondary | ICD-10-CM | POA: Diagnosis not present

## 2020-04-29 DIAGNOSIS — M1990 Unspecified osteoarthritis, unspecified site: Secondary | ICD-10-CM | POA: Diagnosis not present

## 2020-04-29 DIAGNOSIS — M8589 Other specified disorders of bone density and structure, multiple sites: Secondary | ICD-10-CM | POA: Diagnosis not present

## 2020-06-18 DIAGNOSIS — E538 Deficiency of other specified B group vitamins: Secondary | ICD-10-CM | POA: Diagnosis not present

## 2020-08-28 DIAGNOSIS — E538 Deficiency of other specified B group vitamins: Secondary | ICD-10-CM | POA: Diagnosis not present

## 2020-10-03 DIAGNOSIS — I10 Essential (primary) hypertension: Secondary | ICD-10-CM | POA: Diagnosis not present

## 2020-10-03 DIAGNOSIS — E538 Deficiency of other specified B group vitamins: Secondary | ICD-10-CM | POA: Diagnosis not present

## 2020-10-03 DIAGNOSIS — R2689 Other abnormalities of gait and mobility: Secondary | ICD-10-CM | POA: Diagnosis not present

## 2020-10-03 DIAGNOSIS — K219 Gastro-esophageal reflux disease without esophagitis: Secondary | ICD-10-CM | POA: Diagnosis not present

## 2020-10-03 DIAGNOSIS — D509 Iron deficiency anemia, unspecified: Secondary | ICD-10-CM | POA: Diagnosis not present

## 2020-10-03 DIAGNOSIS — M2550 Pain in unspecified joint: Secondary | ICD-10-CM | POA: Diagnosis not present

## 2020-10-03 DIAGNOSIS — M8580 Other specified disorders of bone density and structure, unspecified site: Secondary | ICD-10-CM | POA: Diagnosis not present

## 2020-10-03 DIAGNOSIS — J449 Chronic obstructive pulmonary disease, unspecified: Secondary | ICD-10-CM | POA: Diagnosis not present

## 2020-10-03 DIAGNOSIS — E875 Hyperkalemia: Secondary | ICD-10-CM | POA: Diagnosis not present

## 2020-10-03 DIAGNOSIS — M1990 Unspecified osteoarthritis, unspecified site: Secondary | ICD-10-CM | POA: Diagnosis not present

## 2020-10-07 DIAGNOSIS — Z23 Encounter for immunization: Secondary | ICD-10-CM | POA: Diagnosis not present

## 2020-10-23 ENCOUNTER — Emergency Department (HOSPITAL_BASED_OUTPATIENT_CLINIC_OR_DEPARTMENT_OTHER)
Admission: EM | Admit: 2020-10-23 | Discharge: 2020-10-24 | Disposition: A | Discharge status: Home / Self Care | Payer: Medicare Other | Attending: Emergency Medicine | Admitting: Emergency Medicine

## 2020-10-23 ENCOUNTER — Encounter (HOSPITAL_BASED_OUTPATIENT_CLINIC_OR_DEPARTMENT_OTHER): Payer: Self-pay | Admitting: Emergency Medicine

## 2020-10-23 ENCOUNTER — Emergency Department (HOSPITAL_BASED_OUTPATIENT_CLINIC_OR_DEPARTMENT_OTHER): Payer: Medicare Other

## 2020-10-23 ENCOUNTER — Other Ambulatory Visit: Payer: Self-pay

## 2020-10-23 DIAGNOSIS — I10 Essential (primary) hypertension: Secondary | ICD-10-CM | POA: Insufficient documentation

## 2020-10-23 DIAGNOSIS — Z85828 Personal history of other malignant neoplasm of skin: Secondary | ICD-10-CM | POA: Diagnosis not present

## 2020-10-23 DIAGNOSIS — Z96649 Presence of unspecified artificial hip joint: Secondary | ICD-10-CM | POA: Insufficient documentation

## 2020-10-23 DIAGNOSIS — Y92007 Garden or yard of unspecified non-institutional (private) residence as the place of occurrence of the external cause: Secondary | ICD-10-CM | POA: Diagnosis not present

## 2020-10-23 DIAGNOSIS — W19XXXA Unspecified fall, initial encounter: Secondary | ICD-10-CM

## 2020-10-23 DIAGNOSIS — W01198A Fall on same level from slipping, tripping and stumbling with subsequent striking against other object, initial encounter: Secondary | ICD-10-CM | POA: Insufficient documentation

## 2020-10-23 DIAGNOSIS — S0990XA Unspecified injury of head, initial encounter: Secondary | ICD-10-CM

## 2020-10-23 DIAGNOSIS — S0101XA Laceration without foreign body of scalp, initial encounter: Secondary | ICD-10-CM | POA: Diagnosis not present

## 2020-10-23 DIAGNOSIS — Z87891 Personal history of nicotine dependence: Secondary | ICD-10-CM | POA: Diagnosis not present

## 2020-10-23 MED ORDER — LIDOCAINE-EPINEPHRINE (PF) 2 %-1:200000 IJ SOLN
10.0000 mL | Freq: Once | INTRAMUSCULAR | Status: AC
  Administered 2020-10-23: 10 mL
  Filled 2020-10-23: qty 20

## 2020-10-23 MED ORDER — NEBIVOLOL HCL 5 MG PO TABS
10.0000 mg | ORAL_TABLET | Freq: Every day | ORAL | Status: DC
  Administered 2020-10-23: 10 mg via ORAL
  Filled 2020-10-23: qty 2

## 2020-10-23 NOTE — ED Triage Notes (Addendum)
Fell in [place] and hit back of head , it bled a lot has  ~ 2 inch lac to back oif  head with a lump no loc she states , is not on any blood thinners

## 2020-10-23 NOTE — ED Notes (Signed)
Wound irrigated with 2L of NS and wrapped in Pressure Bandage to stop Bleeding. Patient comfortable with Call [place] in Re[place]. Staff will continue to monitor.

## 2020-10-23 NOTE — ED Notes (Signed)
Suture Cart at Bedside 

## 2020-10-23 NOTE — ED Notes (Signed)
Wounds irrigated by this RN with Sterile Water. Wounds then wrapped with Ace Wrap and Gauze. Patient brought into BR via wheelchair and ambulated well with cane. Wounds will be reassessed after Moderate Pressure with Wrap.

## 2020-10-24 DIAGNOSIS — S0101XA Laceration without foreign body of scalp, initial encounter: Secondary | ICD-10-CM | POA: Diagnosis not present

## 2020-10-24 NOTE — Discharge Instructions (Addendum)
1.  Follow head injury instructions.  Return to the emergency department immediately if there are any signs of confusion, incoordination, double vision, worsening headache or other concerning symptoms. 2.  You may wash over your sutures.  Rinse well pat dry and apply antibiotic ointment. 3.  You have a very large hematoma and laceration to the top of your head.  Sleep and sit with your head elevated at least 30 degrees inclination.  You will likely see a lot of bruising that might be visible behind your ears in the back of your scalp, as well as possibly down the sides of your neck as the hematoma in your scalp starts resolving.  Try to apply a well wrapped ice pack to the top of your head for about 30 minutes every 2 hours for the next 24 to 48 hours. 4.  You should see your doctor for removal of stitches in approximately 10 to 14 days.

## 2020-10-24 NOTE — ED Provider Notes (Signed)
[facility] EMERGENCY DEPT Provider Note   CSN: [id number] Arrival date & time: 10/23/20  1702     History Chief Complaint  Patient presents with   Fall   Head Laceration    Stacy Erickson is a 85 y.o. female.  HPI Patient was working in her garden.  She fell and hit the back of her head.  Patient denies that she got knocked out.  She is not on a blood thinner.  The laceration did bleed a lot.  No double vision, patient denies significant headache.  She was able to walk with steady gait and no other injury    Past Medical History:  Diagnosis Date   Acid reflux    Hyperlipidemia    Hypertension    PAD (peripheral artery disease) (HCC)    Renal artery occlusion (HCC)    1-59% left renal artery stenosis by 08/17/16 [place]; known right renal artery occlusion since at least 09/20/00 by angiogram   Skin cancer of nose    Squamous cell cancer of skin of right shoulder     Patient Active Problem List   Diagnosis Date Noted   PAD (peripheral artery disease) ([place]) 01/21/2015   PVD (peripheral vascular disease) ([place]) 12/14/2011    Past Surgical History:  Procedure Laterality Date   ABDOMINAL HYSTERECTOMY     CATARACT EXTRACTION, BILATERAL  2015   GANGLION CYST EXCISION     INSERTION OF MESH N/A 03/19/2020   Procedure: INSERTION OF MESH;  Surgeon: Donnie Mesa, MD;  Location: [facility];  Service: General;  Laterality: N/A;   JOINT REPLACEMENT     hip   PR VEIN BYPASS GRAFT,AORTO-FEM-POP  2005   Left Fem=pop graft by Dr. Marice Potter HERNIA REPAIR N/A 03/19/2020   Procedure: [place];  Surgeon: Donnie Mesa, MD;  Location: [facility];  Service: General;  Laterality: N/A;     OB History   No obstetric history on file.     Family History  Problem Relation Age of Onset   Heart disease Father    Cancer Sister    Colon cancer Other 38   Colon polyps Child    Colon polyps Child     Social History   Tobacco Use   Smoking status: Former     Packs/day: 2.00    Years: 35.00    Pack years: 70.00    Types: Cigarettes    Quit date: 10/13/2001    Years since quitting: 19.0   Smokeless tobacco: Never   Tobacco comments:    nicotine patch uses.  Vaping Use   Vaping Use: Never used  Substance Use Topics   Alcohol use: Yes    Alcohol/week: 7.0 standard drinks    Types: 7 Standard drinks or equivalent per week    Comment: one Klus of wine or a mix drink per night   Drug use: No    Home Medications Prior to Admission medications   Medication Sig Start Date End Date Taking? Authorizing Provider  acetaminophen (TYLENOL) 500 MG tablet Take 1,000 mg by mouth every 6 (six) hours as needed for mild pain.    [provider]  Cholecalciferol (VITAMIN D3) 1000 UNITS CAPS Take 3,000 Units by mouth daily.    [provider]  Cyanocobalamin (B-12) 1000 MCG/ML KIT Inject 1,000 mcg into the skin See admin instructions. Inject 1068mg subcutaneously every other month.    [provider]  fluticasone (FLONASE) 50 MCG/ACT nasal spray Place 1 spray  into both nostrils at bedtime as needed for allergies or rhinitis.    [provider]  furosemide (LASIX) 40 MG tablet Take 20 mg by mouth daily. 02/11/20   [provider]  HYDROcodone-acetaminophen (NORCO/VICODIN) 5-325 MG tablet Take 1 tablet by mouth every 6 (six) hours as needed for moderate pain. 03/19/20   Tsuei, Matthew, MD  ibuprofen (ADVIL) 200 MG tablet Take 200 mg by mouth every 6 (six) hours as needed for mild pain.    [provider]  loratadine (CLARITIN) 10 MG tablet Take 10 mg by mouth daily.    [provider]  Menthol, Topical Analgesic, (ASPERCREME MAX ROLL-ON EX) Apply 1 application topically every 6 (six) hours as needed (pain).    [provider]  Multiple Vitamin (MULTIVITAMIN) capsule Take 1 capsule by mouth daily.    [provider]  nebivolol (BYSTOLIC) 10 MG tablet Take 10 mg by mouth daily. Take at  bedtime    [provider]  nicotine (NICODERM CQ - DOSED IN MG/24 HOURS) 14 mg/24hr patch Place 1 patch onto the skin daily.    [provider]  omeprazole (PRILOSEC) 20 MG capsule Take 20 mg by mouth daily.    [provider]  Probiotic Product (PROBIOTIC PO) Take 1 capsule by mouth daily.    [provider]  simvastatin (ZOCOR) 20 MG tablet Take 20 mg by mouth every evening.    [provider]  valsartan (DIOVAN) 80 MG tablet Take 40 mg by mouth 2 (two) times daily. 12/23/14   [provider]    Allergies    Codeine and Sulfa antibiotics  Review of Systems   Review of Systems 10 systems reviewed negative except as per HPI Physical Exam Updated Vital Signs BP (!) 192/105 (BP Location: Right Arm)   Pulse 84   Temp 98.7 F (37.1 C)   Resp 18   SpO2 100%   Physical Exam Constitutional:      Comments: Patient is alert and oriented.  GCS of 15.  Speech is clear.  No signs of confusion  HENT:     Head:     Comments: Patient has a large, irregular macerated laceration to the vertex of the scalp.  This is approximately 6 cm.  Clot present and continuous oozing blood.  About a 4 cm associated hematoma.    Nose: Nose normal.     Mouth/Throat:     Pharynx: Oropharynx is clear.  Eyes:     Extraocular Movements: Extraocular movements intact.  Pulmonary:     Effort: Pulmonary effort is normal.  Musculoskeletal:        General: Normal range of motion.     Cervical back: Neck supple.     Comments: No extremity deformities.  Normal range of motion all 4 extremities.  Patient has build ambulate and reposition without limitation.  Neurological:     General: No focal deficit present.     Mental Status: She is oriented to person, place, and time.     Motor: No weakness.     Coordination: Coordination normal.  Psychiatric:        Mood and Affect: Mood normal.    ED Results / Procedures / Treatments   Labs (all labs ordered are  listed, but only abnormal results are displayed) Labs Reviewed - No data to display  EKG None  Radiology CT HEAD WO CONTRAST (5MM)  Result Date: 10/23/2020 CLINICAL DATA:  Head trauma, mod-severe.  Fall. EXAM: CT HEAD WITHOUT   CONTRAST TECHNIQUE: Contiguous axial images were obtained from the base of the skull through the vertex without intravenous contrast. COMPARISON:  None. FINDINGS: Brain: There is atrophy and chronic small vessel disease changes. No acute intracranial abnormality. Specifically, no hemorrhage, hydrocephalus, mass lesion, acute infarction, or significant intracranial injury. Vascular: No hyperdense vessel or unexpected calcification. Skull: No acute calvarial abnormality. Sinuses/Orbits: Air-fluid level in the left maxillary sinus. Mucosal thickening in the ethmoid air cells. Other: None IMPRESSION: Atrophy, chronic microvascular disease. No acute intracranial abnormality. Acute on chronic sinusitis. Electronically Signed   By: Kevin  Dover M.D.   On: 10/23/2020 18:23    Procedures ..Laceration Repair  Date/Time: 10/24/2020 12:05 AM Performed by: , , MD Authorized by: , , MD   Consent:    Consent obtained:  Verbal   Consent given by:  Patient Laceration details:    Location:  Scalp   Scalp location:  Crown   Length (cm):  6   Depth (mm):  5 Pre-procedure details:    Preparation:  Patient was prepped and draped in usual sterile fashion Exploration:    Hemostasis achieved with:  Epinephrine and direct pressure   Wound extent: areolar tissue violated     Contaminated: no   Treatment:    Area cleansed with:  Saline   Amount of cleaning:  Extensive   Irrigation solution:  Sterile saline   Irrigation method:  Syringe Skin repair:    Repair method:  Sutures   Suture size:  4-0   Suture material:  Nylon   Suture technique:  Simple interrupted   Number of sutures:  7 Approximation:    Approximation:  Close Repair type:    Repair type:   Intermediate Post-procedure details:    Dressing:  Antibiotic ointment and bulky dressing   Procedure completion:  Tolerated well, no immediate complications   Medications Ordered in ED Medications  nebivolol (BYSTOLIC) tablet 10 mg (10 mg Oral Given 10/23/20 1934)  lidocaine-EPINEPHrine (XYLOCAINE W/EPI) 2 %-1:200000 (PF) injection 10 mL (10 mLs Infiltration Given 10/23/20 2320)    ED Course  I have reviewed the triage vital signs and the nursing notes.  Pertinent labs & imaging results that were available during my care of the patient were reviewed by me and considered in my medical decision making (see chart for details).    MDM Rules/Calculators/A&P                           Patient presents after a mechanical fall.  Large irregular laceration with clot and persistent bleeding.  CT head without any intracranial injury.  Laceration extensively cleaned and irrigated.  Bleeding controlled after suturing.  Patient has no other apparent injuries.  She has been ambulatory without difficulty.  Mental status is clear with no confusion.  At this time plan will be for management at home with careful return precautions Final Clinical Impression(s) / ED Diagnoses Final diagnoses:  Fall, initial encounter  Scalp laceration, initial encounter  Injury of head, initial encounter    Rx / DC Orders ED Discharge Orders     None        , , MD 10/24/20 0022  

## 2020-10-24 NOTE — ED Notes (Signed)
This RN presented the AVS utilizing Teachback Method. Patient verbalizes understanding of Discharge Instructions. Opportunity for Questioning and Answers were provided. Patient Discharged from ED ambulatory to home with [facility].

## 2020-10-29 DIAGNOSIS — I10 Essential (primary) hypertension: Secondary | ICD-10-CM | POA: Diagnosis not present

## 2020-10-29 DIAGNOSIS — S0101XA Laceration without foreign body of scalp, initial encounter: Secondary | ICD-10-CM | POA: Diagnosis not present

## 2020-10-29 DIAGNOSIS — W19XXXA Unspecified fall, initial encounter: Secondary | ICD-10-CM | POA: Diagnosis not present

## 2020-10-29 DIAGNOSIS — E538 Deficiency of other specified B group vitamins: Secondary | ICD-10-CM | POA: Diagnosis not present

## 2020-12-31 DIAGNOSIS — L570 Actinic keratosis: Secondary | ICD-10-CM | POA: Diagnosis not present

## 2020-12-31 DIAGNOSIS — D1801 Hemangioma of skin and subcutaneous tissue: Secondary | ICD-10-CM | POA: Diagnosis not present

## 2020-12-31 DIAGNOSIS — D692 Other nonthrombocytopenic purpura: Secondary | ICD-10-CM | POA: Diagnosis not present

## 2020-12-31 DIAGNOSIS — E538 Deficiency of other specified B group vitamins: Secondary | ICD-10-CM | POA: Diagnosis not present

## 2020-12-31 DIAGNOSIS — Z23 Encounter for immunization: Secondary | ICD-10-CM | POA: Diagnosis not present

## 2020-12-31 DIAGNOSIS — L821 Other seborrheic keratosis: Secondary | ICD-10-CM | POA: Diagnosis not present

## 2021-01-15 DIAGNOSIS — Z23 Encounter for immunization: Secondary | ICD-10-CM | POA: Diagnosis not present

## 2021-03-25 DIAGNOSIS — Z20822 Contact with and (suspected) exposure to covid-19: Secondary | ICD-10-CM | POA: Diagnosis not present

## 2021-04-21 DIAGNOSIS — M8589 Other specified disorders of bone density and structure, multiple sites: Secondary | ICD-10-CM | POA: Diagnosis not present

## 2021-04-23 DIAGNOSIS — E785 Hyperlipidemia, unspecified: Secondary | ICD-10-CM | POA: Diagnosis not present

## 2021-04-23 DIAGNOSIS — E538 Deficiency of other specified B group vitamins: Secondary | ICD-10-CM | POA: Diagnosis not present

## 2021-04-23 DIAGNOSIS — M8580 Other specified disorders of bone density and structure, unspecified site: Secondary | ICD-10-CM | POA: Diagnosis not present

## 2021-04-23 DIAGNOSIS — I10 Essential (primary) hypertension: Secondary | ICD-10-CM | POA: Diagnosis not present

## 2021-04-24 DIAGNOSIS — R7989 Other specified abnormal findings of blood chemistry: Secondary | ICD-10-CM | POA: Diagnosis not present

## 2021-04-30 DIAGNOSIS — Z1331 Encounter for screening for depression: Secondary | ICD-10-CM | POA: Diagnosis not present

## 2021-04-30 DIAGNOSIS — M8580 Other specified disorders of bone density and structure, unspecified site: Secondary | ICD-10-CM | POA: Diagnosis not present

## 2021-04-30 DIAGNOSIS — Z1339 Encounter for screening examination for other mental health and behavioral disorders: Secondary | ICD-10-CM | POA: Diagnosis not present

## 2021-04-30 DIAGNOSIS — E538 Deficiency of other specified B group vitamins: Secondary | ICD-10-CM | POA: Diagnosis not present

## 2021-04-30 DIAGNOSIS — I10 Essential (primary) hypertension: Secondary | ICD-10-CM | POA: Diagnosis not present

## 2021-04-30 DIAGNOSIS — E785 Hyperlipidemia, unspecified: Secondary | ICD-10-CM | POA: Diagnosis not present

## 2021-04-30 DIAGNOSIS — I440 Atrioventricular block, first degree: Secondary | ICD-10-CM | POA: Diagnosis not present

## 2021-04-30 DIAGNOSIS — R82998 Other abnormal findings in urine: Secondary | ICD-10-CM | POA: Diagnosis not present

## 2021-04-30 DIAGNOSIS — I701 Atherosclerosis of renal artery: Secondary | ICD-10-CM | POA: Diagnosis not present

## 2021-04-30 DIAGNOSIS — Z0000 Encounter for general adult medical examination without abnormal findings: Secondary | ICD-10-CM | POA: Diagnosis not present

## 2021-04-30 DIAGNOSIS — D509 Iron deficiency anemia, unspecified: Secondary | ICD-10-CM | POA: Diagnosis not present

## 2021-04-30 DIAGNOSIS — J449 Chronic obstructive pulmonary disease, unspecified: Secondary | ICD-10-CM | POA: Diagnosis not present

## 2021-04-30 DIAGNOSIS — I739 Peripheral vascular disease, unspecified: Secondary | ICD-10-CM | POA: Diagnosis not present

## 2021-04-30 DIAGNOSIS — D692 Other nonthrombocytopenic purpura: Secondary | ICD-10-CM | POA: Diagnosis not present

## 2021-04-30 DIAGNOSIS — M1990 Unspecified osteoarthritis, unspecified site: Secondary | ICD-10-CM | POA: Diagnosis not present

## 2021-04-30 DIAGNOSIS — Z23 Encounter for immunization: Secondary | ICD-10-CM | POA: Diagnosis not present

## 2021-05-06 DIAGNOSIS — L720 Epidermal cyst: Secondary | ICD-10-CM | POA: Diagnosis not present

## 2021-05-06 DIAGNOSIS — L570 Actinic keratosis: Secondary | ICD-10-CM | POA: Diagnosis not present

## 2021-05-06 DIAGNOSIS — D170 Benign lipomatous neoplasm of skin and subcutaneous tissue of head, face and neck: Secondary | ICD-10-CM | POA: Diagnosis not present

## 2021-05-14 DIAGNOSIS — Z20822 Contact with and (suspected) exposure to covid-19: Secondary | ICD-10-CM | POA: Diagnosis not present

## 2021-05-19 DIAGNOSIS — E538 Deficiency of other specified B group vitamins: Secondary | ICD-10-CM | POA: Diagnosis not present

## 2021-07-01 DIAGNOSIS — E785 Hyperlipidemia, unspecified: Secondary | ICD-10-CM | POA: Diagnosis not present

## 2021-07-01 DIAGNOSIS — R82998 Other abnormal findings in urine: Secondary | ICD-10-CM | POA: Diagnosis not present

## 2021-07-01 DIAGNOSIS — I10 Essential (primary) hypertension: Secondary | ICD-10-CM | POA: Diagnosis not present

## 2021-07-01 DIAGNOSIS — D509 Iron deficiency anemia, unspecified: Secondary | ICD-10-CM | POA: Diagnosis not present

## 2021-07-22 DIAGNOSIS — Z20822 Contact with and (suspected) exposure to covid-19: Secondary | ICD-10-CM | POA: Diagnosis not present

## 2021-08-20 DIAGNOSIS — E538 Deficiency of other specified B group vitamins: Secondary | ICD-10-CM | POA: Diagnosis not present

## 2021-08-21 NOTE — Patient Outreach (Signed)
Received a insurance referral notification for Ms. Detloff ---. I have assigned Joellyn Quails, RN to call for follow up and determine if there are any Case Management needs.    [place], [place], [place] Management [phone]

## 2021-09-01 ENCOUNTER — Other Ambulatory Visit: Payer: Self-pay | Admitting: *Deleted

## 2021-09-01 ENCOUNTER — Encounter: Payer: Self-pay | Admitting: *Deleted

## 2021-09-01 DIAGNOSIS — I700 Atherosclerosis of aorta: Secondary | ICD-10-CM

## 2021-09-01 DIAGNOSIS — I440 Atrioventricular block, first degree: Secondary | ICD-10-CM | POA: Insufficient documentation

## 2021-09-01 DIAGNOSIS — E538 Deficiency of other specified B group vitamins: Secondary | ICD-10-CM | POA: Insufficient documentation

## 2021-09-01 DIAGNOSIS — I10 Essential (primary) hypertension: Secondary | ICD-10-CM | POA: Insufficient documentation

## 2021-09-01 DIAGNOSIS — J449 Chronic obstructive pulmonary disease, unspecified: Secondary | ICD-10-CM | POA: Insufficient documentation

## 2021-09-01 NOTE — Patient Outreach (Signed)
[facility]) Care Management Telephonic RN Care Manager Note   09/01/2021 Name:  Stacy Erickson MRN:  [id number] DOB:  06-23-1935  Summary: Successful outreach, No needs identified Pt requests case closure with option to outreach as needed   Stacy Erickson is a pleasant 86 year old female who inquired about [facility] services and voiced trying to understand the reason for her referral to [facility]. She reports some discussion from her pcp about the referral.  She reports she suspects the referral may be related to her "frustrated over my bladder " medical concern She does admit having her daughter to attend her medical appointments with her  No nephrologist or urology  She reports seeing a provider years ago "had blocked artery and they could not do much about it" She reports "I can always get him to refer me back" She denied any medical or social need with assessment questions.  Pt request case closure and to reach out as needed  Recommendations/Changes made from today's visit: Initial outreach with assessment Reviewed the [facility] services and referral with pt Answered various questions about the insurance referral and care providers for [facility]. Offered to assist with education and care coordination but services denied Offered to send her daughter Mickel Baas [facility] information Encouraged pt to outreach back to RN CM prn    Subjective: Stacy Erickson is an 86 y.o. year old female who is a primary patient of Crist Infante, MD. The care management team was consulted for assistance with care management and/or care coordination needs.    Telephonic RN Care Manager completed Telephone Visit today.   Objective:  Medications Reviewed Today     Reviewed by Blythe Stanford, RN (Registered Nurse) on 03/19/20 at 1305  Med List Status: Complete   Medication Order Taking? Sig Documenting Provider Last Dose Status Informant  acetaminophen (TYLENOL) 500 MG tablet [id number] Yes Take 1,000 mg by mouth every 6 (six) hours  as needed for mild pain. [provider] 03/19/2020 0900 Active Self  Cholecalciferol (VITAMIN D3) 1000 UNITS CAPS [id number] Yes Take 3,000 Units by mouth daily. [provider] Past Week Unknown time Active Self  Cyanocobalamin (B-12) 1000 MCG/ML KIT [id number] Yes Inject 1,000 mcg into the skin See admin instructions. Inject 1025mg subcutaneously every other month. [provider] Past Week Unknown time Active Self  fluticasone (FLONASE) 50 MCG/ACT nasal spray [id number] Place 1 spray into both nostrils at bedtime as needed for allergies or rhinitis. [provider] 03/19/2020 0900 Active Self  furosemide (LASIX) 40 MG tablet [id number] Take 20 mg by mouth daily. [provider] 03/18/2020 Unknown time Active Self  ibuprofen (ADVIL) 200 MG tablet [id number] Take 200 mg by mouth every 6 (six) hours as needed for mild pain. [provider] Past Month Unknown time Active Self  loratadine (CLARITIN) 10 MG tablet [id number] Take 10 mg by mouth daily. [provider] 03/19/2020 0900 Active Self  Menthol, Topical Analgesic, (ASPERCREME MAX ROLL-ON EX) [id number] Apply 1 application topically every 6 (six) hours as needed (pain). [provider] 03/19/2020 0900 Active Self  Multiple Vitamin (MULTIVITAMIN) capsule [id number] Take 1 capsule by mouth daily. [provider] Past Week Unknown time Active Self  nebivolol (BYSTOLIC) 10 MG tablet [id number] Take 10 mg by mouth daily. Take at bedtime [provider] 03/18/2020 Unknown time Active   nicotine (NICODERM CQ - DOSED IN MG/24 HOURS) 14 mg/24hr patch [id number] Place 1 patch onto the skin daily. [provider] 03/19/2020 0900 Active Self  omeprazole (PRILOSEC) 20 MG capsule [id number] Yes Take 20 mg by mouth daily. [provider] 03/19/2020 0900 Active Self  Probiotic Product (PROBIOTIC PO) [id number] Yes Take 1 capsule by mouth daily. [provider] Past Week Unknown time Active Self  simvastatin (ZOCOR) 20 MG tablet [id number] Yes Take 20 mg by mouth every evening. [provider] 03/18/2020 Unknown time Active Self  valsartan (DIOVAN) 80 MG tablet [id number] Yes Take 40 mg by mouth 2 (two) times daily. [provider] 03/18/2020 Unknown time Active Self           Med Note Watt Climes Mar 06, 2020 11:40 AM)               SDOH:  (Social Determinants of Health) assessments and interventions performed:    Care Plan  Review of patient past medical history, allergies, medications, health status, including review of consultants reports, laboratory and other test data, was performed as part of comprehensive evaluation for care management services.   There are no care plans that you recently modified to display for this patient.    Plan: The patient has been provided with contact information for the care management team and has been advised to call with any health related questions or concerns.  No further follow up required: as patient request case closure no identified needs   [place] Stuard L. Lavina Hamman, RN, BSN, [place] Coordinator Office number [phone] Main [facility] number [phone] Fax number [phone]

## 2021-09-22 DIAGNOSIS — E538 Deficiency of other specified B group vitamins: Secondary | ICD-10-CM | POA: Diagnosis not present

## 2021-11-17 DIAGNOSIS — N390 Urinary tract infection, site not specified: Secondary | ICD-10-CM | POA: Diagnosis not present

## 2021-11-17 DIAGNOSIS — I10 Essential (primary) hypertension: Secondary | ICD-10-CM | POA: Diagnosis not present

## 2021-11-17 DIAGNOSIS — M8580 Other specified disorders of bone density and structure, unspecified site: Secondary | ICD-10-CM | POA: Diagnosis not present

## 2021-11-17 DIAGNOSIS — E785 Hyperlipidemia, unspecified: Secondary | ICD-10-CM | POA: Diagnosis not present

## 2021-11-17 DIAGNOSIS — E538 Deficiency of other specified B group vitamins: Secondary | ICD-10-CM | POA: Diagnosis not present

## 2021-11-17 DIAGNOSIS — R350 Frequency of micturition: Secondary | ICD-10-CM | POA: Diagnosis not present

## 2021-11-17 DIAGNOSIS — J449 Chronic obstructive pulmonary disease, unspecified: Secondary | ICD-10-CM | POA: Diagnosis not present

## 2021-11-17 DIAGNOSIS — R809 Proteinuria, unspecified: Secondary | ICD-10-CM | POA: Diagnosis not present

## 2021-11-17 DIAGNOSIS — I701 Atherosclerosis of renal artery: Secondary | ICD-10-CM | POA: Diagnosis not present

## 2021-11-17 DIAGNOSIS — E875 Hyperkalemia: Secondary | ICD-10-CM | POA: Diagnosis not present

## 2021-11-17 DIAGNOSIS — M1990 Unspecified osteoarthritis, unspecified site: Secondary | ICD-10-CM | POA: Diagnosis not present

## 2021-11-17 DIAGNOSIS — D692 Other nonthrombocytopenic purpura: Secondary | ICD-10-CM | POA: Diagnosis not present

## 2021-11-17 DIAGNOSIS — R2689 Other abnormalities of gait and mobility: Secondary | ICD-10-CM | POA: Diagnosis not present

## 2021-12-01 DIAGNOSIS — Z23 Encounter for immunization: Secondary | ICD-10-CM | POA: Diagnosis not present

## 2021-12-16 DIAGNOSIS — L308 Other specified dermatitis: Secondary | ICD-10-CM | POA: Diagnosis not present

## 2021-12-16 DIAGNOSIS — L570 Actinic keratosis: Secondary | ICD-10-CM | POA: Diagnosis not present

## 2021-12-16 DIAGNOSIS — D1801 Hemangioma of skin and subcutaneous tissue: Secondary | ICD-10-CM | POA: Diagnosis not present

## 2021-12-23 DIAGNOSIS — E538 Deficiency of other specified B group vitamins: Secondary | ICD-10-CM | POA: Diagnosis not present

## 2021-12-30 IMAGING — CT CT ABD-PELV W/ CM
2 of 5 series · 17 of 46 positions shown, 19 images · IV contrast (iopamidol)
Comparison: Abdomen only CTA on 02/03/2015

CLINICAL DATA: Left-sided abdominal pain and cramping for
approximately 6 years.

EXAM:
CT ABDOMEN AND PELVIS WITH CONTRAST
TECHNIQUE: Multidetector CT imaging of the abdomen and pelvis was performed
using the standard protocol following bolus administration of
intravenous contrast.
CONTRAST:  100mL [id number] IOPAMIDOL ([id number]) INJECTION 61%

[Series 2: abd pelvis 5.00 br40 s3 axial · axial · 0.73mm/px · z∈[+1247,+1552]mm · 14 of 71 slices shown, 16 images]
[im 5/71  soft-tissue]
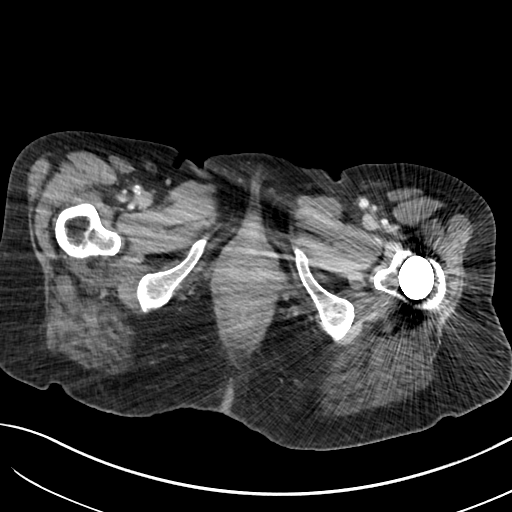
[im 5/71  bone]
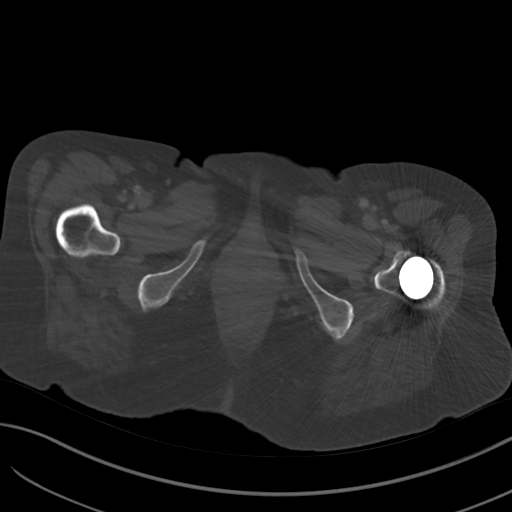
[im 9/71  soft-tissue]
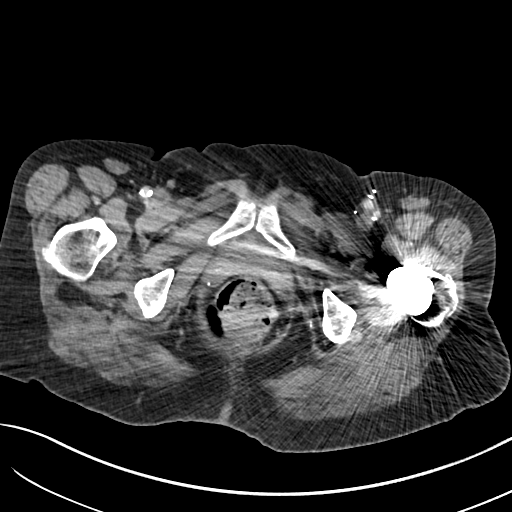
[im 14/71  soft-tissue]
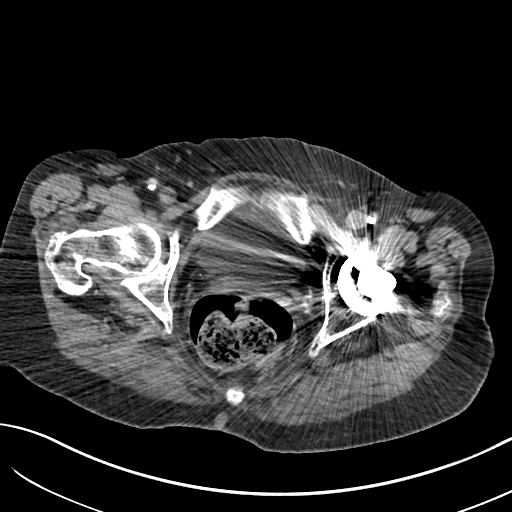
[im 18/71  soft-tissue]
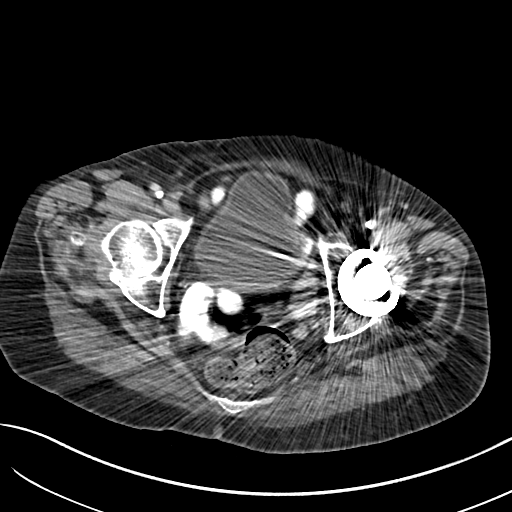
[im 22/71  soft-tissue]
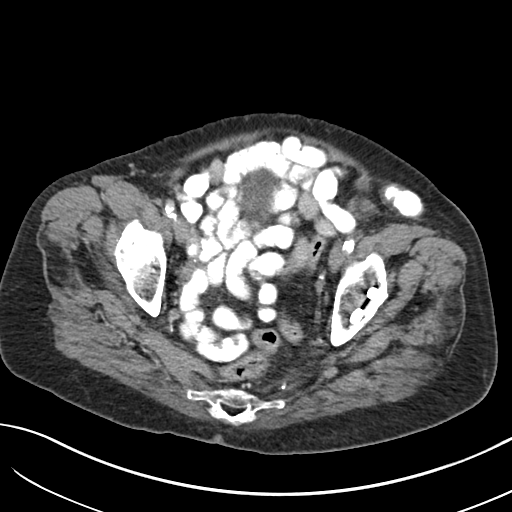
[im 27/71  soft-tissue]
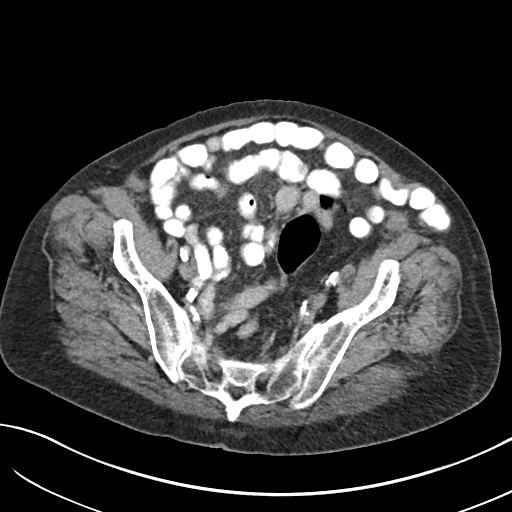
[im 31/71  soft-tissue]
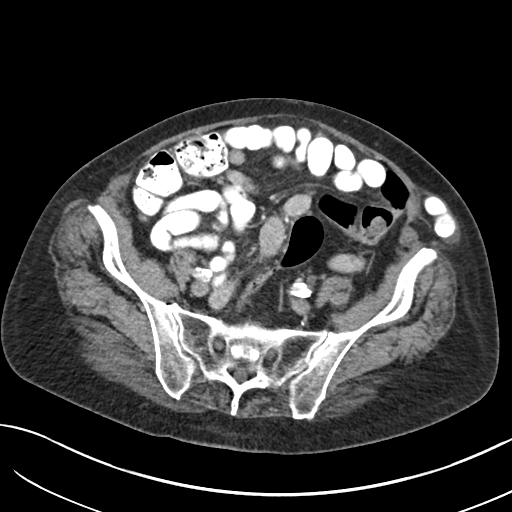
[im 40/71  soft-tissue]
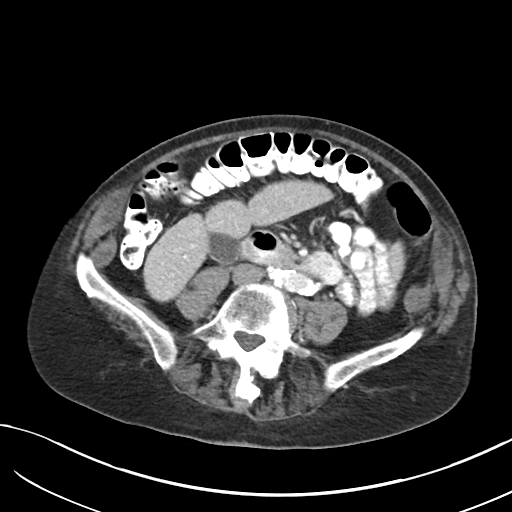
[im 44/71  soft-tissue]
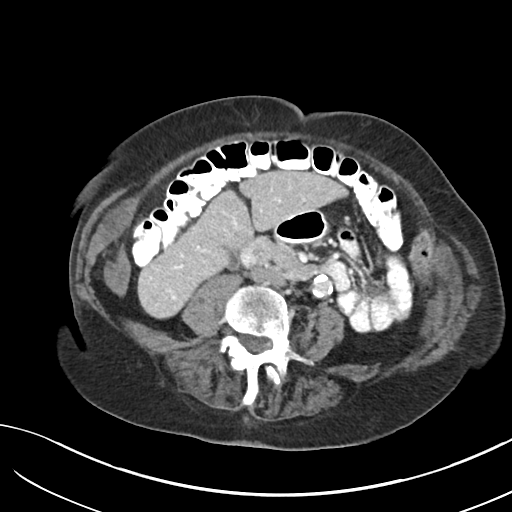
[im 44/71  bone]
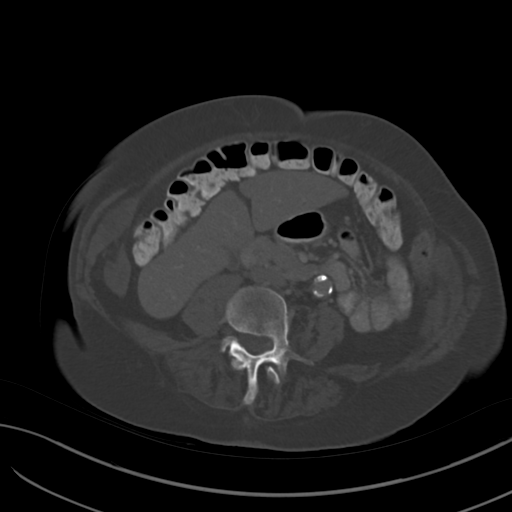
[im 49/71  soft-tissue]
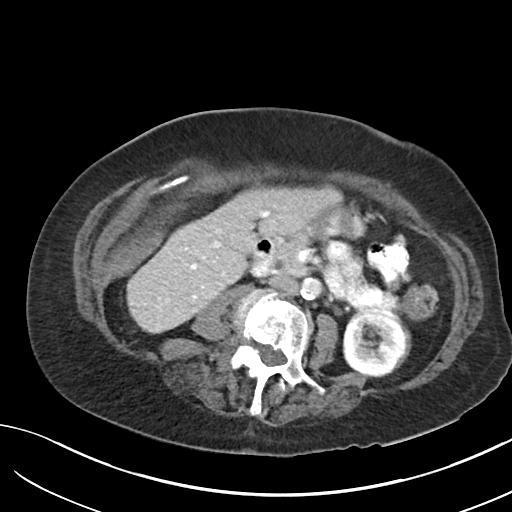
[im 53/71  soft-tissue]
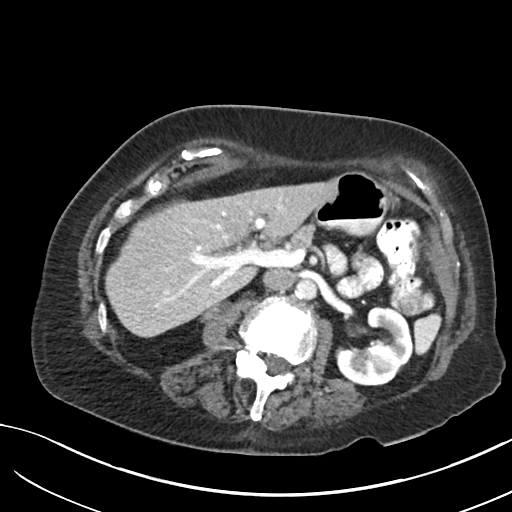
[im 57/71  soft-tissue]
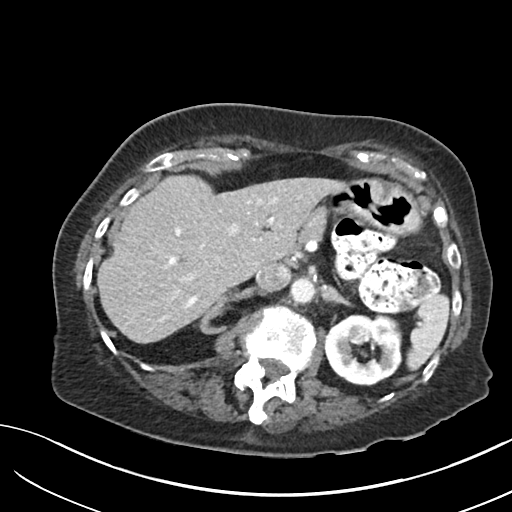
[im 62/71  soft-tissue]
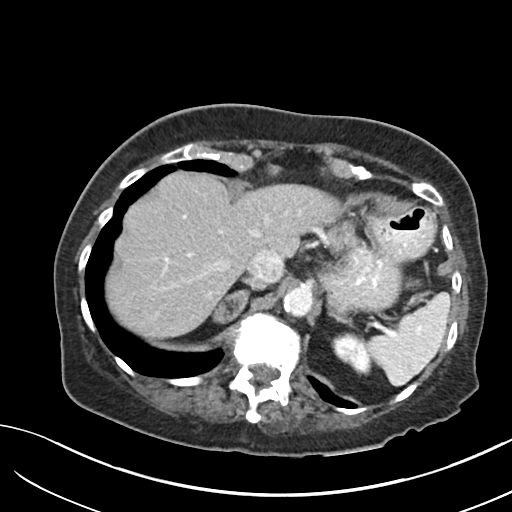
[im 66/71  soft-tissue]
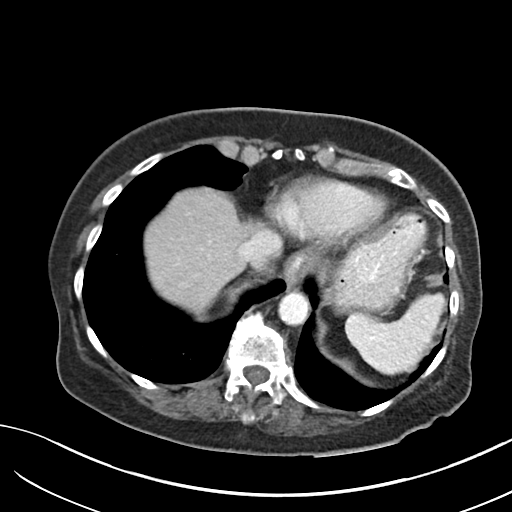

[Series 6: abd pelvis 2.00 br40 s3 cor · coronal · 0.70mm/px · 3 of 182 slices shown]
[im 61/182  soft-tissue]
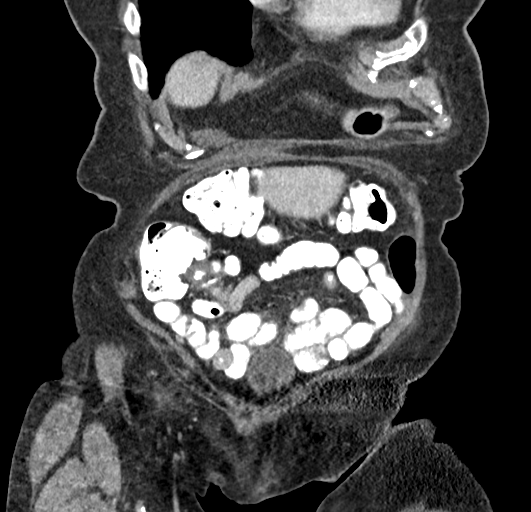
[im 81/182  soft-tissue]
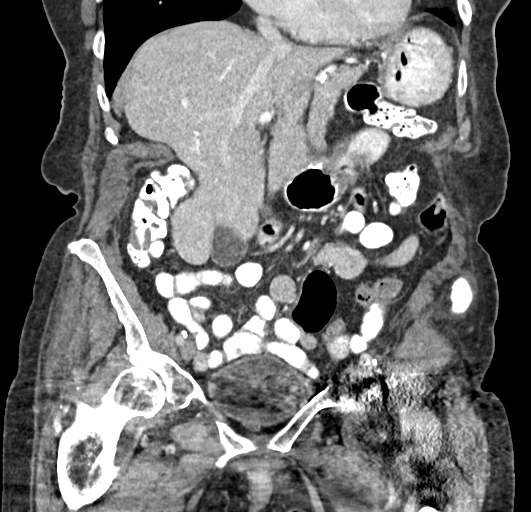
[im 101/182  soft-tissue]
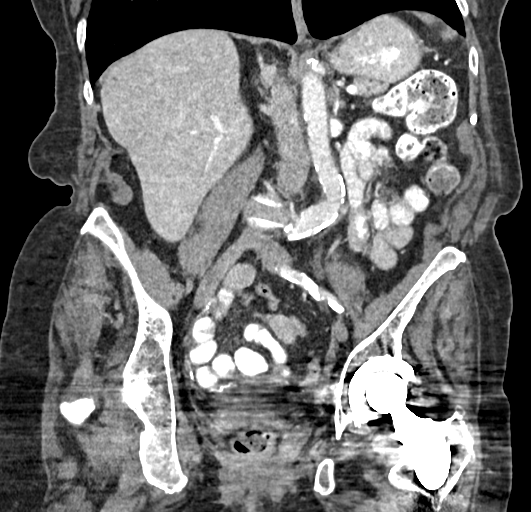

[17 of 46 positions shown; findings below may reference images not displayed]

FINDINGS: Lower Chest: No acute findings.

Hepatobiliary: No hepatic masses identified. Gallbladder is
unremarkable. No evidence of biliary ductal dilatation.

Pancreas:  No mass or inflammatory changes.

Spleen: Within normal limits in size and appearance.

Adrenals/Urinary Tract: Severe diffuse right renal parenchymal
atrophy and tiny cysts remain stable. A tiny subcapsular cyst is
also seen in the upper pole the left kidney, but there is no
evidence of renal mass or hydronephrosis. Unremarkable unopacified
urinary bladder.

Stomach/Bowel: No evidence of obstruction, inflammatory process or
abnormal fluid collections.

Vascular/Lymphatic: No pathologically enlarged lymph nodes. No
abdominal aortic aneurysm. Aortic atherosclerotic calcification
noted.

Reproductive: Prior hysterectomy noted. Adnexal regions are
unremarkable in appearance.

Other: A small left lower quadrant ventral abdominal wall hernia is
seen containing small bowel. No evidence of strangulation or
obstruction.

Musculoskeletal: No suspicious bone lesions identified. Left hip
prosthesis noted.
IMPRESSION: No acute findings within the abdomen or pelvis.

Small left lower quadrant ventral abdominal wall hernia containing
small bowel. No evidence of strangulation or obstruction.

Stable severe right renal parenchymal atrophy.

Aortic Atherosclerosis ([id number]).

## 2022-01-26 DIAGNOSIS — Z23 Encounter for immunization: Secondary | ICD-10-CM | POA: Diagnosis not present

## 2022-01-26 DIAGNOSIS — E538 Deficiency of other specified B group vitamins: Secondary | ICD-10-CM | POA: Diagnosis not present

## 2022-02-18 ENCOUNTER — Emergency Department (HOSPITAL_COMMUNITY): Payer: Medicare Other

## 2022-02-18 ENCOUNTER — Emergency Department (HOSPITAL_COMMUNITY)
Admission: EM | Admit: 2022-02-18 | Discharge: 2022-02-18 | Disposition: A | Discharge status: Home / Self Care | Payer: Medicare Other | Attending: Emergency Medicine | Admitting: Emergency Medicine

## 2022-02-18 ENCOUNTER — Encounter (HOSPITAL_COMMUNITY): Payer: Self-pay

## 2022-02-18 ENCOUNTER — Other Ambulatory Visit: Payer: Self-pay

## 2022-02-18 DIAGNOSIS — N390 Urinary tract infection, site not specified: Secondary | ICD-10-CM | POA: Diagnosis not present

## 2022-02-18 DIAGNOSIS — R350 Frequency of micturition: Secondary | ICD-10-CM | POA: Diagnosis present

## 2022-02-18 DIAGNOSIS — I10 Essential (primary) hypertension: Secondary | ICD-10-CM | POA: Diagnosis not present

## 2022-02-18 DIAGNOSIS — B9689 Other specified bacterial agents as the cause of diseases classified elsewhere: Secondary | ICD-10-CM | POA: Insufficient documentation

## 2022-02-18 DIAGNOSIS — R4182 Altered mental status, unspecified: Secondary | ICD-10-CM | POA: Diagnosis not present

## 2022-02-18 DIAGNOSIS — R410 Disorientation, unspecified: Secondary | ICD-10-CM | POA: Diagnosis not present

## 2022-02-18 LAB — CBC WITH DIFFERENTIAL/PLATELET
Abs Immature Granulocytes: 0.09 10*3/uL — ABNORMAL HIGH (ref 0.00–0.07)
Basophils Absolute: 0 10*3/uL (ref 0.0–0.1)
Basophils Relative: 0 %
Eosinophils Absolute: 0 10*3/uL (ref 0.0–0.5)
Eosinophils Relative: 0 %
HCT: 41.4 % (ref 36.0–46.0)
Hemoglobin: 14.4 g/dL (ref 12.0–15.0)
Immature Granulocytes: 1 %
Lymphocytes Relative: 2 %
Lymphs Abs: 0.3 10*3/uL — ABNORMAL LOW (ref 0.7–4.0)
MCH: 31.9 pg (ref 26.0–34.0)
MCHC: 34.8 g/dL (ref 30.0–36.0)
MCV: 91.8 fL (ref 80.0–100.0)
Monocytes Absolute: 1.1 10*3/uL — ABNORMAL HIGH (ref 0.1–1.0)
Monocytes Relative: 8 %
Neutro Abs: 11.9 10*3/uL — ABNORMAL HIGH (ref 1.7–7.7)
Neutrophils Relative %: 89 %
Platelets: 205 10*3/uL (ref 150–400)
RBC: 4.51 MIL/uL (ref 3.87–5.11)
RDW: 13.5 % (ref 11.5–15.5)
WBC: 13.4 10*3/uL — ABNORMAL HIGH (ref 4.0–10.5)
nRBC: 0.2 % (ref 0.0–0.2)

## 2022-02-18 LAB — URINALYSIS, ROUTINE W REFLEX MICROSCOPIC
Bilirubin Urine: NEGATIVE
Glucose, UA: NEGATIVE mg/dL
Ketones, ur: 5 mg/dL — AB
Leukocytes,Ua: NEGATIVE
Nitrite: NEGATIVE
Protein, ur: >=300 mg/dL — AB
Specific Gravity, Urine: 1.012 (ref 1.005–1.030)
pH: 6 (ref 5.0–8.0)

## 2022-02-18 LAB — BASIC METABOLIC PANEL
Anion gap: 14 (ref 5–15)
BUN: 16 mg/dL (ref 8–23)
CO2: 22 mmol/L (ref 22–32)
Calcium: 10.1 mg/dL (ref 8.9–10.3)
Chloride: 93 mmol/L — ABNORMAL LOW (ref 98–111)
Creatinine, Ser: 1.04 mg/dL — ABNORMAL HIGH (ref 0.44–1.00)
GFR, Estimated: 53 mL/min — ABNORMAL LOW (ref >60)
Glucose, Bld: 121 mg/dL — ABNORMAL HIGH (ref 70–99)
Potassium: 4.3 mmol/L (ref 3.5–5.1)
Sodium: 129 mmol/L — ABNORMAL LOW (ref 135–145)

## 2022-02-18 LAB — CK: Total CK: 1070 U/L — ABNORMAL HIGH (ref 38–234)

## 2022-02-18 MED ORDER — CEPHALEXIN 500 MG PO CAPS: 500.0000 mg | ORAL_CAPSULE | Freq: Three times a day (TID) | ORAL | 0 refills | Status: DC

## 2022-02-18 MED ORDER — BACITRACIN ZINC 500 UNIT/GM EX OINT
TOPICAL_OINTMENT | Freq: Two times a day (BID) | CUTANEOUS | Status: DC
  Administered 2022-02-18: 1 via TOPICAL
  Filled 2022-02-18: qty 0.9

## 2022-02-18 MED ORDER — SODIUM CHLORIDE 0.9 % IV BOLUS
500.0000 mL | Freq: Once | INTRAVENOUS | Status: AC
  Administered 2022-02-18: 500 mL via INTRAVENOUS

## 2022-02-18 MED ORDER — SODIUM CHLORIDE 0.9 % IV SOLN
1.0000 g | Freq: Once | INTRAVENOUS | Status: AC
  Administered 2022-02-18: 1 g via INTRAVENOUS
  Filled 2022-02-18: qty 10

## 2022-02-18 NOTE — ED Provider Notes (Signed)
[facility] EMERGENCY DEPARTMENT Provider Note   CSN: [id number] Arrival date & time: 02/18/22  1847     History Chief Complaint  Patient presents with   Urinary Frequency    Stacy Erickson is a 86 y.o. female.  HPI Patient presents to the ER after being found in bathroom on her knees this morning by neighbors. Patient is unable to explain why she was on her knees, but EMS noted she "smelled like a UTI". Patient reports having dysuria 2 days ago with increased urgency, but cannot describe current symptoms.  She states that she cut back on Lasix due to increased urine frequency.  She denies current hematuria, flank pain, abdominal pain, fevers, headaches, chest pain, shortness of breath, or leg swelling.    Home Medications Prior to Admission medications   Medication Sig Start Date End Date Taking? Authorizing Provider  cephALEXin (KEFLEX) 500 MG capsule Take 1 capsule (500 mg total) by mouth 3 (three) times daily. 02/18/22  Yes Luvenia Heller, PA-C  acetaminophen (TYLENOL) 500 MG tablet Take 1,000 mg by mouth every 6 (six) hours as needed for mild pain.    [provider]  chlorhexidine (PERIDEX) 0.12 % solution SMARTSIG:15 By Mouth Twice Daily 04/06/21   [provider]  Cholecalciferol (VITAMIN D3) 1000 UNITS CAPS Take 3,000 Units by mouth daily.    [provider]  Cyanocobalamin (B-12 COMPLIANCE INJECTION) 1000 MCG/ML KIT 1 IM 1x a month Injection 01/13/17   [provider]  Cyanocobalamin (B-12) 1000 MCG/ML KIT Inject 1,000 mcg into the skin See admin instructions. Inject 1033mg subcutaneously every other month.    [provider]  fluticasone (FLONASE) 50 MCG/ACT nasal spray Place 1 spray into both nostrils at bedtime as needed for allergies or rhinitis.    [provider]  furosemide (LASIX) 40 MG tablet Take 20 mg by mouth daily. 02/11/20   [provider]  HYDROcodone-acetaminophen (NORCO/VICODIN)  5-325 MG tablet Take 1 tablet by mouth every 6 (six) hours as needed for moderate pain. 03/19/20   TDonnie Mesa MD  ibuprofen (ADVIL) 200 MG tablet Take 200 mg by mouth every 6 (six) hours as needed for mild pain.    [provider]  Lidocaine HCl (ASPERCREME LIDOCAINE) 4 % CREA Aspercreme (lidocaine) 4 % topical patch    [provider]  loratadine (CLARITIN) 10 MG tablet Take 10 mg by mouth daily.    [provider]  LORazepam (ATIVAN) 0.5 MG tablet 1/2 to one tablet up to once a day as needed for anxiety Orally Once a day as needed for 30 days 01/07/21   [provider]  Menthol, Topical Analgesic, (ASPERCREME MAX ROLL-ON EX) Apply 1 application topically every 6 (six) hours as needed (pain).    [provider]  Multiple Vitamin (MULTIVITAMIN) capsule Take 1 capsule by mouth daily.    [provider]  nebivolol (BYSTOLIC) 10 MG tablet Take 10 mg by mouth daily. Take at bedtime    [provider]  nicotine (NICODERM CQ - DOSED IN MG/24 HOURS) 14 mg/24hr patch Place 1 patch onto the skin daily.    [provider]  nitrofurantoin, macrocrystal-monohydrate, (MACROBID) 100 MG capsule Take 100 mg by mouth 2 (two) times daily. 05/19/21   [provider]  nitrofurantoin, macrocrystal-monohydrate, (MACROBID) 100 MG capsule Take 1 capsule po 2x a day x 5 days Orally as directed for 5 days 05/19/21   [provider]  omeprazole (PRILOSEC) 20 MG capsule Take  20 mg by mouth daily.    [provider]  omeprazole (PRILOSEC) 20 MG capsule 1 capsule 30 minutes before morning meal Orally Once a day for 90 days    [provider]  Probiotic Product (PROBIOTIC PO) Take 1 capsule by mouth daily.    [provider]  simvastatin (ZOCOR) 20 MG tablet Take 20 mg by mouth every evening.    [provider]  simvastatin (ZOCOR) 20 MG tablet 1 tablet in the evening Orally Once a day for 90 days     [provider]  valsartan (DIOVAN) 80 MG tablet Take 40 mg by mouth 2 (two) times daily. 12/23/14   [provider]      Allergies    Codeine and Sulfa antibiotics    Review of Systems   Review of Systems  Constitutional:  Positive for activity change. Negative for fatigue and fever.  Respiratory:  Negative for cough.   Cardiovascular:  Negative for chest pain.  Gastrointestinal:  Negative for abdominal pain, constipation, diarrhea, nausea and vomiting.  Genitourinary:  Positive for dysuria, frequency, hematuria and urgency.  All other systems reviewed and are negative.   Physical Exam Updated Vital Signs BP (!) 166/90   Pulse 93   Temp 98 F (36.7 C) (Oral)   Resp 17   SpO2 97%  Physical Exam Vitals and nursing note reviewed.  Constitutional:      Appearance: Normal appearance. She is normal weight.  HENT:     Head: Normocephalic and atraumatic.  Eyes:     Conjunctiva/sclera: Conjunctivae normal.     Pupils: Pupils are equal, round, and reactive to light.  Cardiovascular:     Rate and Rhythm: Normal rate and regular rhythm.     Pulses: Normal pulses.     Heart sounds: Normal heart sounds.  Pulmonary:     Effort: Pulmonary effort is normal.     Breath sounds: Normal breath sounds.  Abdominal:     General: Abdomen is flat.  Musculoskeletal:        General: No deformity or signs of injury.     Right lower leg: No edema.     Left lower leg: No edema.  Skin:    General: Skin is warm and dry.     Findings: Lesion present.     Comments: Patient has small skin tear on right elbow. Pressure injuries to bilateral knees.  Neurological:     Mental Status: She is alert.     ED Results / Procedures / Treatments   Labs (all labs ordered are listed, but only abnormal results are displayed) Labs Reviewed  BASIC METABOLIC PANEL - Abnormal; Notable for the following components:      Result Value   Sodium 129 (*)    Chloride 93 (*)    Glucose, Bld 121  (*)    Creatinine, Ser 1.04 (*)    GFR, Estimated 53 (*)    All other components within normal limits  CBC WITH DIFFERENTIAL/PLATELET - Abnormal; Notable for the following components:   WBC 13.4 (*)    Neutro Abs 11.9 (*)    Lymphs Abs 0.3 (*)    Monocytes Absolute 1.1 (*)    Abs Immature Granulocytes 0.09 (*)    All other components within normal limits  URINALYSIS, ROUTINE W REFLEX MICROSCOPIC - Abnormal; Notable for the following components:   APPearance HAZY (*)    Hgb urine dipstick MODERATE (*)    Ketones, ur 5 (*)  Protein, ur >=300 (*)    Bacteria, UA MANY (*)    All other components within normal limits  CK - Abnormal; Notable for the following components:   Total CK 1,070 (*)    All other components within normal limits  URINE CULTURE    EKG EKG Interpretation  Date/Time:  Thursday February 18 2022 18:56:36 EST Ventricular Rate:  93 PR Interval:  251 QRS Duration: 88 QT Interval:  362 QTC Calculation: 451 R Axis:   -26 Text Interpretation: Sinus rhythm Prolonged PR interval Abnormal R-wave progression, late transition LVH by voltage Inferior infarct, old Confirmed by Noemi Chapel [phone]) on 02/18/2022 7:10:32 PM  Radiology CT Head Wo Contrast  Result Date: 02/18/2022 CLINICAL DATA:  Altered mental status EXAM: CT HEAD WITHOUT CONTRAST TECHNIQUE: Contiguous axial images were obtained from the base of the skull through the vertex without intravenous contrast. RADIATION DOSE REDUCTION: This exam was performed according to the departmental dose-optimization program which includes automated exposure control, adjustment of the mA and/or kV according to patient size and/or use of iterative reconstruction technique. COMPARISON:  CT 10/23/2020 FINDINGS: Brain: No acute territorial infarction, hemorrhage or intracranial mass. Atrophy. Advanced chronic small vessel ischemic changes of the white matter. Stable ventricle size Vascular: No hyperdense vessels.  Carotid vascular  calcification. Skull: Fluid in the left mastoid air cells. Prominent partially calcified pannus Sinuses/Orbits: Marked left maxillary sinus mucosal thickening with patchy ethmoidal sinus disease. Other: None IMPRESSION: 1. No CT evidence for acute intracranial abnormality. 2. Atrophy and chronic small vessel ischemic changes of the white matter. 3. Probable prominent soft tissue pannus partially visualized at the posterior dens, correlate for any history of rheumatoid. This exerts some mass effect on the upper cervical cord and may be assessed with nonemergent MRI if deemed clinically appropriate Electronically Signed   By: Donavan Foil M.D.   On: 02/18/2022 22:01    Procedures Procedures   Medications Ordered in ED Medications  bacitracin ointment (1 Application Topical Given 02/18/22 2259)  cefTRIAXone (ROCEPHIN) 1 g in sodium chloride 0.9 % 100 mL IVPB (1 g Intravenous New Bag/Given 02/18/22 2259)  sodium chloride 0.9 % bolus 500 mL (0 mLs Intravenous Stopped 02/18/22 2157)    ED Course/ Medical Decision Making/ A&P                           Medical Decision Making Amount and/or Complexity of Data Reviewed Labs: ordered.   This patient presents to the ED for concern of UTI. Differential diagnosis includes pyelonephritis, drug overdose, urinary retention, pneumonia, pressure injury, mechanical fall   Additional history obtained:  Additional history obtained from daughter   Lab Tests:  I Ordered, and personally interpreted labs.  The pertinent results include:  UA consistent with infection, decreased GFR, CK elevated 1070   Imaging Studies ordered:  I ordered imaging studies including CT head  I independently visualized and interpreted imaging which showed no acute intracranial abnormality I agree with the radiologist interpretation   Medicines ordered and prescription drug management:  I ordered medication including rocephin for UTI  I have reviewed the patients home  medicines and have made adjustments as needed   Problem List / ED Course:  Patient presented to the ER via EMS after she was found kneeling on the ground in the bathroom. She could not recall why she was in there or how long she had been in there, but stated she began experiencing dysuria 2 days prior.  Labs were consistent with a UTI and some CK elevation likely due to injuries to elbows and knees. Advised patient that treatment consist of antibiotic therapy for 10 days as well as dose of rocephin while in the ER. Patient and her family were agreeable to this plan and verbalized understanding return precautions.   Final Clinical Impression(s) / ED Diagnoses Final diagnoses:  Urinary tract infection without hematuria, site unspecified    Rx / DC Orders ED Discharge Orders          Ordered    cephALEXin (KEFLEX) 500 MG capsule  3 times daily        02/18/22 2240              Luvenia Heller, PA-C 02/18/22 2300    Noemi Chapel, MD 02/20/22 1231

## 2022-02-18 NOTE — ED Notes (Signed)
Urine collected and sent to lab.

## 2022-02-18 NOTE — ED Triage Notes (Signed)
Pt BIBA from home. The patient was found in bathroom kneeling and couldn't tell her neighbors why she was in there. Pt A&O x4 at this time. Per EMS pt smells of UTI and had an unsteady gait once she got up off the bathroom floor.

## 2022-02-18 NOTE — ED Notes (Signed)
Blood work collected and sent to lab

## 2022-02-18 NOTE — ED Provider Notes (Signed)
Patient is a 86 year old female lives by herself, neighbors went to check on her today because she did not lift up the blinds in her front room, they found her on the bathroom floor on her hands and knees, she was weak, evidently she has some issues with urinary incontinence and for the last 2 days has felt some generalized weakness in her legs.  No vomiting no fever no headache no coughing or shortness of breath no chest pain.  The daughter who is at the bedside reports that she looks and is talking like her normal self.  The patient denies having any other symptoms.  Evidently the paramedics reported that there was some urinary incontinence and she smelled of foul urine.  On exam she does have what appears to be some pressure sores on the soft tissue of the elbows and the knees, this is all nontender, there is no open wounds except for a small blister on the right elbow consistent with a pressure ulcer.  Will check a CK to make sure there is no renal damage as she only has 1 functioning kidney, will also check labs, urinalysis and give some hydration.  Vital signs are unremarkable except for mild hypertension.  There is no signs of head injury however given the patient's fall onto the ground and potentially some altered mental status and lack of memory of the events we will also obtain a CT of the head  Medical screening examination/treatment/procedure(s) were conducted as a shared visit with non-physician practitioner(s) and myself.  I personally evaluated the patient during the encounter.  Clinical Impression:   Final diagnoses:  None         Noemi Chapel, MD 02/20/22 1231

## 2022-02-20 LAB — URINE CULTURE: Culture: >=100000 — AB

## 2022-02-21 ENCOUNTER — Telehealth (HOSPITAL_BASED_OUTPATIENT_CLINIC_OR_DEPARTMENT_OTHER): Payer: Self-pay | Admitting: *Deleted

## 2022-02-21 NOTE — Telephone Encounter (Signed)
Post ED Visit - Positive Culture Follow-up  Culture report reviewed by antimicrobial stewardship pharmacist: [facility] Team '[]'$  [address] Pharm.D. '[]'$  Heide Guile, Pharm.D., BCPS AQ-ID '[]'$  Parks Neptune, Pharm.D., BCPS '[]'$  Alycia Rossetti, Pharm.D., BCPS '[]'$  [place], [place].D., BCPS, AAHIVP '[]'$  Legrand Como, Pharm.D., BCPS, AAHIVP '[]'$  Salome Arnt, PharmD, BCPS '[]'$  [name], PharmD, BCPS '[]'$  Hughes Better, PharmD, BCPS '[]'$  Leeroy Cha, PharmD '[]'$  Laqueta Linden, PharmD, BCPS '[x]'$  Erskine Speed,, PharmD  [place] Team '[]'$  Leodis Sias, PharmD '[]'$  Lindell Spar, PharmD '[]'$  Royetta Asal, PharmD '[]'$  Graylin Shiver, Rph '[]'$  Rema Fendt) Glennon Mac, PharmD '[]'$  Arlyn Dunning, PharmD '[]'$  Netta Cedars, PharmD '[]'$  Dia Sitter, PharmD '[]'$  Leone Haven, PharmD '[]'$  Gretta Arab, PharmD '[]'$  Theodis Shove, PharmD '[]'$  Peggyann Juba, PharmD '[]'$  Reuel Boom, PharmD   Positive urine culture Treated with Cephalexin  organism sensitive to the same and no further patient follow-up is required at this time.  Stacy Erickson 02/21/2022, 12:12 PM

## 2022-02-26 DIAGNOSIS — I10 Essential (primary) hypertension: Secondary | ICD-10-CM | POA: Diagnosis not present

## 2022-02-26 DIAGNOSIS — E875 Hyperkalemia: Secondary | ICD-10-CM | POA: Diagnosis not present

## 2022-02-26 DIAGNOSIS — R2689 Other abnormalities of gait and mobility: Secondary | ICD-10-CM | POA: Diagnosis not present

## 2022-02-26 DIAGNOSIS — N390 Urinary tract infection, site not specified: Secondary | ICD-10-CM | POA: Diagnosis not present

## 2022-02-26 DIAGNOSIS — E871 Hypo-osmolality and hyponatremia: Secondary | ICD-10-CM | POA: Diagnosis not present

## 2022-02-26 DIAGNOSIS — R350 Frequency of micturition: Secondary | ICD-10-CM | POA: Diagnosis not present

## 2022-03-30 DIAGNOSIS — I10 Essential (primary) hypertension: Secondary | ICD-10-CM | POA: Diagnosis not present

## 2022-03-30 DIAGNOSIS — E875 Hyperkalemia: Secondary | ICD-10-CM | POA: Diagnosis not present

## 2022-03-30 DIAGNOSIS — N3281 Overactive bladder: Secondary | ICD-10-CM | POA: Diagnosis not present

## 2022-03-30 DIAGNOSIS — E538 Deficiency of other specified B group vitamins: Secondary | ICD-10-CM | POA: Diagnosis not present

## 2022-03-30 DIAGNOSIS — N390 Urinary tract infection, site not specified: Secondary | ICD-10-CM | POA: Diagnosis not present

## 2022-03-30 DIAGNOSIS — E871 Hypo-osmolality and hyponatremia: Secondary | ICD-10-CM | POA: Diagnosis not present

## 2022-04-13 DIAGNOSIS — R82998 Other abnormal findings in urine: Secondary | ICD-10-CM | POA: Diagnosis not present

## 2022-05-10 DIAGNOSIS — D649 Anemia, unspecified: Secondary | ICD-10-CM | POA: Diagnosis not present

## 2022-05-10 DIAGNOSIS — D509 Iron deficiency anemia, unspecified: Secondary | ICD-10-CM | POA: Diagnosis not present

## 2022-05-10 DIAGNOSIS — R7989 Other specified abnormal findings of blood chemistry: Secondary | ICD-10-CM | POA: Diagnosis not present

## 2022-05-10 DIAGNOSIS — E785 Hyperlipidemia, unspecified: Secondary | ICD-10-CM | POA: Diagnosis not present

## 2022-05-10 DIAGNOSIS — N183 Chronic kidney disease, stage 3 unspecified: Secondary | ICD-10-CM | POA: Diagnosis not present

## 2022-05-10 DIAGNOSIS — K219 Gastro-esophageal reflux disease without esophagitis: Secondary | ICD-10-CM | POA: Diagnosis not present

## 2022-05-10 DIAGNOSIS — E538 Deficiency of other specified B group vitamins: Secondary | ICD-10-CM | POA: Diagnosis not present

## 2022-05-17 DIAGNOSIS — M1990 Unspecified osteoarthritis, unspecified site: Secondary | ICD-10-CM | POA: Diagnosis not present

## 2022-05-17 DIAGNOSIS — I10 Essential (primary) hypertension: Secondary | ICD-10-CM | POA: Diagnosis not present

## 2022-05-17 DIAGNOSIS — D509 Iron deficiency anemia, unspecified: Secondary | ICD-10-CM | POA: Diagnosis not present

## 2022-05-17 DIAGNOSIS — M8580 Other specified disorders of bone density and structure, unspecified site: Secondary | ICD-10-CM | POA: Diagnosis not present

## 2022-05-17 DIAGNOSIS — E538 Deficiency of other specified B group vitamins: Secondary | ICD-10-CM | POA: Diagnosis not present

## 2022-05-17 DIAGNOSIS — I440 Atrioventricular block, first degree: Secondary | ICD-10-CM | POA: Diagnosis not present

## 2022-05-17 DIAGNOSIS — G2581 Restless legs syndrome: Secondary | ICD-10-CM | POA: Diagnosis not present

## 2022-05-17 DIAGNOSIS — K219 Gastro-esophageal reflux disease without esophagitis: Secondary | ICD-10-CM | POA: Diagnosis not present

## 2022-05-17 DIAGNOSIS — I701 Atherosclerosis of renal artery: Secondary | ICD-10-CM | POA: Diagnosis not present

## 2022-05-17 DIAGNOSIS — E785 Hyperlipidemia, unspecified: Secondary | ICD-10-CM | POA: Diagnosis not present

## 2022-05-17 DIAGNOSIS — I739 Peripheral vascular disease, unspecified: Secondary | ICD-10-CM | POA: Diagnosis not present

## 2022-05-17 DIAGNOSIS — Z0000 Encounter for general adult medical examination without abnormal findings: Secondary | ICD-10-CM | POA: Diagnosis not present

## 2022-06-23 DIAGNOSIS — E538 Deficiency of other specified B group vitamins: Secondary | ICD-10-CM | POA: Diagnosis not present

## 2022-07-12 ENCOUNTER — Encounter: Payer: Self-pay | Admitting: Physician Assistant

## 2022-07-15 ENCOUNTER — Ambulatory Visit (INDEPENDENT_AMBULATORY_CARE_PROVIDER_SITE_OTHER): Payer: Medicare Other | Admitting: Gastroenterology

## 2022-07-15 ENCOUNTER — Encounter: Payer: Self-pay | Admitting: Gastroenterology

## 2022-07-15 VITALS — BP 130/82 | HR 95 | Wt 139.0 lb

## 2022-07-15 DIAGNOSIS — R790 Abnormal level of blood mineral: Secondary | ICD-10-CM | POA: Diagnosis not present

## 2022-07-15 DIAGNOSIS — R195 Other fecal abnormalities: Secondary | ICD-10-CM

## 2022-07-15 NOTE — Patient Instructions (Signed)
Repeat labs with Dr. Waynard Edwards after 8-12 weeks on Iron supplement.   _______________________________________________________  If your blood pressure at your visit was 140/90 or greater, please contact your primary care physician to follow up on this.  _______________________________________________________  If you are age 85 or older, your body mass index should be between 23-30. Your Body mass index is 27.15 kg/m. If this is out of the aforementioned range listed, please consider follow up with your Primary Care Provider.  If you are age 74 or younger, your body mass index should be between 19-25. Your Body mass index is 27.15 kg/m. If this is out of the aformentioned range listed, please consider follow up with your Primary Care Provider.   ________________________________________________________  The [facility] providers would like to encourage you to use [facility] to communicate with providers for non-urgent requests or questions.  Due to long hold times on the telephone, sending your provider a message by [facility] may be a faster and more efficient way to get a response.  Please allow 48 business hours for a response.  Please remember that this is for non-urgent requests.  _______________________________________________________

## 2022-07-15 NOTE — Progress Notes (Signed)
07/15/2022 Stacy Erickson [id number] [birth year]   HISTORY OF PRESENT ILLNESS:  This is an 87 year old female who is a patient of Dr. Ardell Isaacs.  She is here today at the referral of Dr. Waynard Edwards for hemoccult positive stool with a low ferritin level.  Hemoglobin is 13.1 g.  Ferritin borderline low at 26.3 (low end of reference range is 28), others iron studies normal.  No sign of overt GI bleeding, she denies any dark or bloody stools prior to being on ferrous sulfate, but now is taking that daily so stool color has changed.  Had a normal colonoscopy in 2016.  Had one positive hemoccult.  No other GI complaints.  Past Medical History:  Diagnosis Date   Acid reflux    Hyperlipidemia    Hypertension    PAD (peripheral artery disease) (HCC)    Renal artery occlusion (HCC)    1-59% left renal artery stenosis by 08/17/16 [place]; known right renal artery occlusion since at least 09/20/00 by angiogram   Skin cancer of nose    Squamous cell cancer of skin of right shoulder    Past Surgical History:  Procedure Laterality Date   ABDOMINAL HYSTERECTOMY     CATARACT EXTRACTION, BILATERAL  2015   GANGLION CYST EXCISION     INSERTION OF MESH N/A 03/19/2020   Procedure: INSERTION OF MESH;  Surgeon: Manus Rudd, MD;  Location: [facility] OR;  Service: General;  Laterality: N/A;   JOINT REPLACEMENT     hip   PR VEIN BYPASS GRAFT,AORTO-FEM-POP  2005   Left Fem=pop graft by Dr. Brett Fairy HERNIA REPAIR N/A 03/19/2020   Procedure: OPEN VENTRAL HERNIA REPAIR WITH MESH;  Surgeon: Manus Rudd, MD;  Location: [facility] OR;  Service: General;  Laterality: N/A;    reports that she quit smoking about 20 years ago. Her smoking use included cigarettes. She has a 70.00 pack-year smoking history. She has never used smokeless tobacco. She reports current alcohol use of about 7.0 standard drinks of alcohol per week. She reports that she does not use drugs. family history includes Cancer in her sister; Colon cancer (age of onset: 52)  in an other family member; Colon polyps in her child and child; Heart disease in her father; Liver disease in her brother. Allergies  Allergen Reactions   Codeine Nausea Only   Sulfa Antibiotics Itching and Nausea Only      Outpatient Encounter Medications as of 07/15/2022  Medication Sig   acetaminophen (TYLENOL) 500 MG tablet Take 1,000 mg by mouth every 6 (six) hours as needed for mild pain.   chlorhexidine (PERIDEX) 0.12 % solution SMARTSIG:15 By Mouth Twice Daily   Cholecalciferol (VITAMIN D3) 1000 UNITS CAPS Take 3,000 Units by mouth daily.   Cyanocobalamin (B-12 COMPLIANCE INJECTION) 1000 MCG/ML KIT 1 IM 1x a month Injection   fluticasone (FLONASE) 50 MCG/ACT nasal spray Place 1 spray into both nostrils at bedtime as needed for allergies or rhinitis.   furosemide (LASIX) 40 MG tablet Take 20 mg by mouth daily.   ibuprofen (ADVIL) 200 MG tablet Take 200 mg by mouth every 6 (six) hours as needed for mild pain.   Lidocaine HCl (ASPERCREME LIDOCAINE) 4 % CREA Aspercreme (lidocaine) 4 % topical patch   loratadine (CLARITIN) 10 MG tablet Take 10 mg by mouth daily.   LORazepam (ATIVAN) 0.5 MG tablet 1/2 to one tablet up to once a day as needed for anxiety Orally Once a day as needed for 30 days  Menthol, Topical Analgesic, (ASPERCREME MAX ROLL-ON EX) Apply 1 application topically every 6 (six) hours as needed (pain).   Multiple Vitamin (MULTIVITAMIN) capsule Take 1 capsule by mouth daily.   nebivolol (BYSTOLIC) 10 MG tablet Take 10 mg by mouth daily. Take at bedtime   nicotine (NICODERM CQ - DOSED IN MG/24 HOURS) 14 mg/24hr patch Place 1 patch onto the skin daily.   omeprazole (PRILOSEC) 20 MG capsule 1 capsule 30 minutes before morning meal Orally Once a day for 90 days   Probiotic Product (PROBIOTIC PO) Take 1 capsule by mouth daily.   simvastatin (ZOCOR) 20 MG tablet 1 tablet in the evening Orally Once a day for 90 days   valsartan (DIOVAN) 80 MG tablet Take 40 mg by mouth 2 (two) times  daily.   [DISCONTINUED] cephALEXin (KEFLEX) 500 MG capsule Take 1 capsule (500 mg total) by mouth 3 (three) times daily.   [DISCONTINUED] Cyanocobalamin (B-12) 1000 MCG/ML KIT Inject 1,000 mcg into the skin See admin instructions. Inject subcutaneously every other month.   [DISCONTINUED] HYDROcodone-acetaminophen (NORCO/VICODIN) 5-325 MG tablet Take 1 tablet by mouth every 6 (six) hours as needed for moderate pain.   [DISCONTINUED] nitrofurantoin, macrocrystal-monohydrate, (MACROBID) 100 MG capsule Take 100 mg by mouth 2 (two) times daily.   [DISCONTINUED] nitrofurantoin, macrocrystal-monohydrate, (MACROBID) 100 MG capsule Take 1 capsule po 2x a day x 5 days Orally as directed for 5 days   [DISCONTINUED] omeprazole (PRILOSEC) 20 MG capsule Take 20 mg by mouth daily.   [DISCONTINUED] simvastatin (ZOCOR) 20 MG tablet Take 20 mg by mouth every evening.   No facility-administered encounter medications on file as of 07/15/2022.     REVIEW OF SYSTEMS  : All other systems reviewed and negative except where noted in the History of Present Illness.   PHYSICAL EXAM: BP 130/82   Pulse 95   Wt 139 lb (63 kg)   BMI 27.15 kg/m  General: Well developed white female in no acute distress Head: Normocephalic and atraumatic Eyes:  Sclerae anicteric, conjunctiva pink. Ears: Normal auditory acuity Lungs: Clear throughout to auscultation; no W/R/R. Heart: Regular rate and rhythm; no M/R/G. Abdomen: Soft, non-distended.  BS present.  Non-tender. Musculoskeletal: Symmetrical with no gross deformities  Skin: No lesions on visible extremities Extremities: No edema  Neurological: Alert oriented x 4, grossly non-focal Psychological:  Alert and cooperative. Normal mood and affect  ASSESSMENT AND PLAN: *Hemoccult positive stool with a low ferritin level.  Hemoglobin is 13.1 g.  Ferritin borderline low at 26.3 (low end of reference range is 28), others iron studies normal.  No sign of overt GI bleeding.   Had a normal colonoscopy in 2016.  Had one positive hemoccult.  Discussed with patient and her daughter.  At her age, not inclined to evaluate endoscopically extensively unless things were to continue to worsen.  She has been placed on ferrous sulfate and will continue that for the next several weeks and have repeat labs performed by her PCP and we will see if they will forward those to [place].   CC:  Rodrigo Ran, MD

## 2022-08-11 DIAGNOSIS — D649 Anemia, unspecified: Secondary | ICD-10-CM | POA: Diagnosis not present

## 2022-08-11 DIAGNOSIS — R7989 Other specified abnormal findings of blood chemistry: Secondary | ICD-10-CM | POA: Diagnosis not present

## 2022-08-11 DIAGNOSIS — E785 Hyperlipidemia, unspecified: Secondary | ICD-10-CM | POA: Diagnosis not present

## 2022-08-11 DIAGNOSIS — E538 Deficiency of other specified B group vitamins: Secondary | ICD-10-CM | POA: Diagnosis not present

## 2022-08-11 DIAGNOSIS — K219 Gastro-esophageal reflux disease without esophagitis: Secondary | ICD-10-CM | POA: Diagnosis not present

## 2022-08-11 DIAGNOSIS — D509 Iron deficiency anemia, unspecified: Secondary | ICD-10-CM | POA: Diagnosis not present

## 2022-09-20 ENCOUNTER — Ambulatory Visit: Payer: Medicare Other | Admitting: Physician Assistant

## 2022-09-22 DIAGNOSIS — E538 Deficiency of other specified B group vitamins: Secondary | ICD-10-CM | POA: Diagnosis not present

## 2022-11-12 DIAGNOSIS — N390 Urinary tract infection, site not specified: Secondary | ICD-10-CM | POA: Diagnosis not present

## 2022-11-12 DIAGNOSIS — M1990 Unspecified osteoarthritis, unspecified site: Secondary | ICD-10-CM | POA: Diagnosis not present

## 2022-11-12 DIAGNOSIS — M8580 Other specified disorders of bone density and structure, unspecified site: Secondary | ICD-10-CM | POA: Diagnosis not present

## 2022-11-12 DIAGNOSIS — D649 Anemia, unspecified: Secondary | ICD-10-CM | POA: Diagnosis not present

## 2022-11-12 DIAGNOSIS — E538 Deficiency of other specified B group vitamins: Secondary | ICD-10-CM | POA: Diagnosis not present

## 2022-11-12 DIAGNOSIS — M19019 Primary osteoarthritis, unspecified shoulder: Secondary | ICD-10-CM | POA: Diagnosis not present

## 2022-11-12 DIAGNOSIS — M25512 Pain in left shoulder: Secondary | ICD-10-CM | POA: Diagnosis not present

## 2022-11-12 DIAGNOSIS — D509 Iron deficiency anemia, unspecified: Secondary | ICD-10-CM | POA: Diagnosis not present

## 2022-11-12 DIAGNOSIS — B029 Zoster without complications: Secondary | ICD-10-CM | POA: Diagnosis not present

## 2022-11-12 DIAGNOSIS — R350 Frequency of micturition: Secondary | ICD-10-CM | POA: Diagnosis not present

## 2022-11-17 ENCOUNTER — Encounter (HOSPITAL_COMMUNITY): Payer: Self-pay

## 2022-11-17 ENCOUNTER — Inpatient Hospital Stay (HOSPITAL_COMMUNITY)
Admission: EM | Admit: 2022-11-17 | Discharge: 2022-11-26 | DRG: 689 | Disposition: A | Discharge status: Skilled Nursing Facility | Payer: Medicare Other | Attending: Family Medicine | Admitting: Family Medicine

## 2022-11-17 ENCOUNTER — Other Ambulatory Visit: Payer: Self-pay

## 2022-11-17 ENCOUNTER — Emergency Department (HOSPITAL_COMMUNITY): Payer: Medicare Other

## 2022-11-17 DIAGNOSIS — I701 Atherosclerosis of renal artery: Secondary | ICD-10-CM | POA: Diagnosis not present

## 2022-11-17 DIAGNOSIS — Z882 Allergy status to sulfonamides status: Secondary | ICD-10-CM | POA: Diagnosis not present

## 2022-11-17 DIAGNOSIS — G2581 Restless legs syndrome: Secondary | ICD-10-CM | POA: Diagnosis not present

## 2022-11-17 DIAGNOSIS — G9341 Metabolic encephalopathy: Secondary | ICD-10-CM | POA: Diagnosis present

## 2022-11-17 DIAGNOSIS — R531 Weakness: Secondary | ICD-10-CM | POA: Diagnosis not present

## 2022-11-17 DIAGNOSIS — K219 Gastro-esophageal reflux disease without esophagitis: Secondary | ICD-10-CM | POA: Diagnosis present

## 2022-11-17 DIAGNOSIS — E871 Hypo-osmolality and hyponatremia: Principal | ICD-10-CM

## 2022-11-17 DIAGNOSIS — N390 Urinary tract infection, site not specified: Secondary | ICD-10-CM | POA: Diagnosis not present

## 2022-11-17 DIAGNOSIS — Z83719 Family history of colon polyps, unspecified: Secondary | ICD-10-CM | POA: Diagnosis not present

## 2022-11-17 DIAGNOSIS — E861 Hypovolemia: Secondary | ICD-10-CM | POA: Diagnosis not present

## 2022-11-17 DIAGNOSIS — R2681 Unsteadiness on feet: Secondary | ICD-10-CM | POA: Diagnosis not present

## 2022-11-17 DIAGNOSIS — M5136 Other intervertebral disc degeneration, lumbar region: Secondary | ICD-10-CM | POA: Diagnosis not present

## 2022-11-17 DIAGNOSIS — Z800 Family history of malignant neoplasm of digestive organs: Secondary | ICD-10-CM | POA: Diagnosis not present

## 2022-11-17 DIAGNOSIS — Z885 Allergy status to narcotic agent status: Secondary | ICD-10-CM

## 2022-11-17 DIAGNOSIS — N261 Atrophy of kidney (terminal): Secondary | ICD-10-CM | POA: Diagnosis not present

## 2022-11-17 DIAGNOSIS — N1832 Chronic kidney disease, stage 3b: Secondary | ICD-10-CM | POA: Diagnosis present

## 2022-11-17 DIAGNOSIS — R2689 Other abnormalities of gait and mobility: Secondary | ICD-10-CM | POA: Diagnosis not present

## 2022-11-17 DIAGNOSIS — Z85828 Personal history of other malignant neoplasm of skin: Secondary | ICD-10-CM | POA: Diagnosis not present

## 2022-11-17 DIAGNOSIS — N179 Acute kidney failure, unspecified: Secondary | ICD-10-CM | POA: Diagnosis not present

## 2022-11-17 DIAGNOSIS — Z8249 Family history of ischemic heart disease and other diseases of the circulatory system: Secondary | ICD-10-CM

## 2022-11-17 DIAGNOSIS — Z90710 Acquired absence of both cervix and uterus: Secondary | ICD-10-CM

## 2022-11-17 DIAGNOSIS — I129 Hypertensive chronic kidney disease with stage 1 through stage 4 chronic kidney disease, or unspecified chronic kidney disease: Secondary | ICD-10-CM | POA: Diagnosis not present

## 2022-11-17 DIAGNOSIS — R102 Pelvic and perineal pain: Secondary | ICD-10-CM | POA: Diagnosis not present

## 2022-11-17 DIAGNOSIS — Z96642 Presence of left artificial hip joint: Secondary | ICD-10-CM | POA: Diagnosis not present

## 2022-11-17 DIAGNOSIS — N3090 Cystitis, unspecified without hematuria: Principal | ICD-10-CM | POA: Diagnosis present

## 2022-11-17 DIAGNOSIS — B029 Zoster without complications: Secondary | ICD-10-CM | POA: Diagnosis not present

## 2022-11-17 DIAGNOSIS — M6281 Muscle weakness (generalized): Secondary | ICD-10-CM | POA: Diagnosis not present

## 2022-11-17 DIAGNOSIS — E785 Hyperlipidemia, unspecified: Secondary | ICD-10-CM | POA: Diagnosis not present

## 2022-11-17 DIAGNOSIS — M4317 Spondylolisthesis, lumbosacral region: Secondary | ICD-10-CM | POA: Diagnosis not present

## 2022-11-17 DIAGNOSIS — B9620 Unspecified Escherichia coli [E. coli] as the cause of diseases classified elsewhere: Secondary | ICD-10-CM | POA: Diagnosis not present

## 2022-11-17 DIAGNOSIS — R4182 Altered mental status, unspecified: Secondary | ICD-10-CM | POA: Diagnosis not present

## 2022-11-17 DIAGNOSIS — Z79899 Other long term (current) drug therapy: Secondary | ICD-10-CM

## 2022-11-17 DIAGNOSIS — Z87891 Personal history of nicotine dependence: Secondary | ICD-10-CM

## 2022-11-17 DIAGNOSIS — G9340 Encephalopathy, unspecified: Secondary | ICD-10-CM | POA: Diagnosis not present

## 2022-11-17 DIAGNOSIS — N3000 Acute cystitis without hematuria: Secondary | ICD-10-CM

## 2022-11-17 DIAGNOSIS — I10 Essential (primary) hypertension: Secondary | ICD-10-CM | POA: Diagnosis not present

## 2022-11-17 DIAGNOSIS — R296 Repeated falls: Secondary | ICD-10-CM | POA: Diagnosis not present

## 2022-11-17 DIAGNOSIS — I700 Atherosclerosis of aorta: Secondary | ICD-10-CM | POA: Diagnosis not present

## 2022-11-17 DIAGNOSIS — M47816 Spondylosis without myelopathy or radiculopathy, lumbar region: Secondary | ICD-10-CM | POA: Diagnosis not present

## 2022-11-17 DIAGNOSIS — E222 Syndrome of inappropriate secretion of antidiuretic hormone: Secondary | ICD-10-CM | POA: Diagnosis not present

## 2022-11-17 DIAGNOSIS — I739 Peripheral vascular disease, unspecified: Secondary | ICD-10-CM | POA: Diagnosis not present

## 2022-11-17 DIAGNOSIS — M438X6 Other specified deforming dorsopathies, lumbar region: Secondary | ICD-10-CM | POA: Diagnosis not present

## 2022-11-17 DIAGNOSIS — K2100 Gastro-esophageal reflux disease with esophagitis, without bleeding: Secondary | ICD-10-CM | POA: Diagnosis not present

## 2022-11-17 DIAGNOSIS — M533 Sacrococcygeal disorders, not elsewhere classified: Secondary | ICD-10-CM | POA: Diagnosis present

## 2022-11-17 DIAGNOSIS — I672 Cerebral atherosclerosis: Secondary | ICD-10-CM | POA: Diagnosis not present

## 2022-11-17 DIAGNOSIS — E538 Deficiency of other specified B group vitamins: Secondary | ICD-10-CM | POA: Diagnosis not present

## 2022-11-17 DIAGNOSIS — R41841 Cognitive communication deficit: Secondary | ICD-10-CM | POA: Diagnosis not present

## 2022-11-17 LAB — OSMOLALITY: Osmolality: 277 mosm/kg (ref 275–295)

## 2022-11-17 LAB — CBC
HCT: 41.5 % (ref 36.0–46.0)
Hemoglobin: 13.3 g/dL (ref 12.0–15.0)
MCH: 30.1 pg (ref 26.0–34.0)
MCHC: 32 g/dL (ref 30.0–36.0)
MCV: 93.9 fL (ref 80.0–100.0)
Platelets: 223 10*3/uL (ref 150–400)
RBC: 4.42 MIL/uL (ref 3.87–5.11)
RDW: 13.7 % (ref 11.5–15.5)
WBC: 8.2 10*3/uL (ref 4.0–10.5)
nRBC: 0 % (ref 0.0–0.2)

## 2022-11-17 LAB — BASIC METABOLIC PANEL
Anion gap: 13 (ref 5–15)
BUN: 33 mg/dL — ABNORMAL HIGH (ref 8–23)
CO2: 20 mmol/L — ABNORMAL LOW (ref 22–32)
Calcium: 9.2 mg/dL (ref 8.9–10.3)
Chloride: 91 mmol/L — ABNORMAL LOW (ref 98–111)
Creatinine, Ser: 1.55 mg/dL — ABNORMAL HIGH (ref 0.44–1.00)
GFR, Estimated: 33 mL/min — ABNORMAL LOW (ref >60)
Glucose, Bld: 107 mg/dL — ABNORMAL HIGH (ref 70–99)
Potassium: 4.6 mmol/L (ref 3.5–5.1)
Sodium: 124 mmol/L — ABNORMAL LOW (ref 135–145)

## 2022-11-17 LAB — CBG MONITORING, ED: Glucose-Capillary: 113 mg/dL — ABNORMAL HIGH (ref 70–99)

## 2022-11-17 MED ORDER — HEPARIN SODIUM (PORCINE) 5000 UNIT/ML IJ SOLN
5000.0000 [IU] | Freq: Three times a day (TID) | INTRAMUSCULAR | Status: DC
  Administered 2022-11-17 – 2022-11-26 (×26): 5000 [IU] via SUBCUTANEOUS
  Filled 2022-11-17 (×26): qty 1

## 2022-11-17 MED ORDER — ACETAMINOPHEN 500 MG PO TABS
1000.0000 mg | ORAL_TABLET | Freq: Four times a day (QID) | ORAL | Status: DC | PRN
  Administered 2022-11-18 – 2022-11-25 (×14): 1000 mg via ORAL
  Filled 2022-11-17 (×14): qty 2

## 2022-11-17 MED ORDER — MIRABEGRON ER 25 MG PO TB24
50.0000 mg | ORAL_TABLET | Freq: Every day | ORAL | Status: DC
  Administered 2022-11-18 – 2022-11-26 (×9): 50 mg via ORAL
  Filled 2022-11-17 (×9): qty 2

## 2022-11-17 MED ORDER — SODIUM CHLORIDE 0.9 % IV SOLN: INTRAVENOUS | Status: DC

## 2022-11-17 MED ORDER — SODIUM CHLORIDE 0.9 % IV BOLUS
500.0000 mL | Freq: Once | INTRAVENOUS | Status: AC
  Administered 2022-11-17: 500 mL via INTRAVENOUS

## 2022-11-17 MED ORDER — ONDANSETRON HCL 4 MG/2ML IJ SOLN
4.0000 mg | Freq: Four times a day (QID) | INTRAMUSCULAR | Status: DC | PRN
  Administered 2022-11-21: 4 mg via INTRAVENOUS
  Filled 2022-11-17: qty 2

## 2022-11-17 MED ORDER — ONDANSETRON HCL 4 MG PO TABS
4.0000 mg | ORAL_TABLET | Freq: Four times a day (QID) | ORAL | Status: DC | PRN
  Administered 2022-11-25: 4 mg via ORAL
  Filled 2022-11-17: qty 1

## 2022-11-17 MED ORDER — PANTOPRAZOLE SODIUM 40 MG PO TBEC
40.0000 mg | DELAYED_RELEASE_TABLET | Freq: Every day | ORAL | Status: DC
  Administered 2022-11-18 – 2022-11-26 (×9): 40 mg via ORAL
  Filled 2022-11-17 (×9): qty 1

## 2022-11-17 MED ORDER — NEBIVOLOL HCL 10 MG PO TABS
10.0000 mg | ORAL_TABLET | Freq: Every day | ORAL | Status: DC
  Administered 2022-11-18 – 2022-11-26 (×9): 10 mg via ORAL
  Filled 2022-11-17 (×9): qty 1

## 2022-11-17 MED ORDER — HYDRALAZINE HCL 20 MG/ML IJ SOLN
10.0000 mg | Freq: Four times a day (QID) | INTRAMUSCULAR | Status: DC | PRN
  Administered 2022-11-18 – 2022-11-20 (×2): 10 mg via INTRAVENOUS
  Filled 2022-11-17 (×2): qty 1

## 2022-11-17 MED ORDER — SODIUM CHLORIDE 0.9 % IV SOLN: Freq: Once | INTRAVENOUS | Status: DC

## 2022-11-17 MED ORDER — SIMVASTATIN 20 MG PO TABS
20.0000 mg | ORAL_TABLET | Freq: Every day | ORAL | Status: DC
  Administered 2022-11-18 – 2022-11-25 (×8): 20 mg via ORAL
  Filled 2022-11-17 (×8): qty 1

## 2022-11-17 MED ORDER — SODIUM CHLORIDE 0.9 % IV SOLN
1.0000 g | INTRAVENOUS | Status: AC
  Administered 2022-11-17 – 2022-11-21 (×5): 1 g via INTRAVENOUS
  Filled 2022-11-17 (×5): qty 10

## 2022-11-17 MED ORDER — VALACYCLOVIR HCL 500 MG PO TABS
1000.0000 mg | ORAL_TABLET | Freq: Two times a day (BID) | ORAL | Status: DC
  Administered 2022-11-18 – 2022-11-26 (×17): 1000 mg via ORAL
  Filled 2022-11-17 (×20): qty 2

## 2022-11-17 MED ORDER — HYDRALAZINE HCL 50 MG PO TABS
50.0000 mg | ORAL_TABLET | Freq: Three times a day (TID) | ORAL | Status: DC
  Administered 2022-11-18 – 2022-11-26 (×25): 50 mg via ORAL
  Filled 2022-11-17 (×25): qty 1

## 2022-11-17 MED ORDER — ADULT MULTIVITAMIN W/MINERALS CH
1.0000 | ORAL_TABLET | Freq: Every day | ORAL | Status: DC
  Administered 2022-11-18 – 2022-11-26 (×9): 1 via ORAL
  Filled 2022-11-17 (×9): qty 1

## 2022-11-17 MED ORDER — ALBUTEROL SULFATE (2.5 MG/3ML) 0.083% IN NEBU: 2.5000 mg | INHALATION_SOLUTION | RESPIRATORY_TRACT | Status: DC | PRN

## 2022-11-17 NOTE — ED Provider Triage Note (Signed)
Emergency Medicine Provider Triage Evaluation Note  Stacy Erickson , a 87 y.o. female  was evaluated in triage.  Pt complains of headache and sacral pain.  Review of Systems  Positive:  Negative:   Physical Exam  BP 110/66 (BP Location: Right Arm)   Pulse 86   Temp 97.6 F (36.4 C) (Oral)   Resp 18   SpO2 100%  Gen:   Awake, no distress   Resp:  Normal effort  MSK:   Moves extremities without difficulty  Other:    Medical Decision Making  Medically screening exam initiated at 4:52 PM.  Appropriate orders placed.  Stacy Erickson was informed that the remainder of the evaluation will be completed by another provider, this initial triage assessment does not replace that evaluation, and the importance of remaining in the ED until their evaluation is complete.  Mechanical fall Tuesday and hit head. ABX for UTI since Friday. Concerned for posterior head pain and sacral pain. Obtaining labs to see if there is anything else contributing to patient's weakness other than UTI.  Denies fever, chest pain, dyspnea, cough, nausea, vomiting, diarrhea. No blood thinners. No LOC, seizures.    Dorthy Cooler, [place] 11/17/22 [phone]

## 2022-11-17 NOTE — ED Triage Notes (Signed)
Pt is coming in with weakness, was recently Dx with a UTI in which she has had some cognitive improvement but still is physically weak. Family reports as well as she reports having a fall over the weekend and on Monday night. The Monday night falll was the one of the most concern where she believes she hit the back of her head on the wall of the hallway and landed on her butt. She complains of some pain in the back of her head, and some tailbone pain at this time.

## 2022-11-17 NOTE — ED Notes (Signed)
To c-t then to [facility]

## 2022-11-17 NOTE — ED Notes (Signed)
ED TO INPATIENT HANDOFF REPORT  ED Nurse Name and Phone #: chris [phone]  S Name/Age/Gender Stacy Erickson 87 y.o. female Room/Bed: 010C/010C  Code Status   Code Status: Not on file  Home/SNF/Other Home Patient oriented to: self, place, time, and situation Is this baseline? Yes   Triage Complete: Triage complete  Chief Complaint Acute metabolic encephalopathy [G93.41]  Triage Note Pt is coming in with weakness, was recently Dx with a UTI in which she has had some cognitive improvement but still is physically weak. Family reports as well as she reports having a fall over the weekend and on Monday night. The Monday night falll was the one of the most concern where she believes she hit the back of her head on the wall of the hallway and landed on her butt. She complains of some pain in the back of her head, and some tailbone pain at this time.    Allergies Allergies  Allergen Reactions   Codeine Nausea Only   Sulfa Antibiotics Itching and Nausea Only    Level of Care/Admitting Diagnosis ED Disposition     ED Disposition  Admit   Condition  --   Comment  Hospital Area: [facility] [[id number]]  Level of Care: Telemetry Medical [104]  May admit patient to [facility] or [facility] if equivalent level of care is available:: No  Covid Evaluation: Symptomatic Person Under Investigation (PUI) or recent exposure (last 10 days) *Testing Required*  Diagnosis: Acute metabolic encephalopathy [9604540]  Admitting Physician: Lurline Del [[id number]]  Attending Physician: Lurline Del [[id number]]  Certification:: I certify this patient will need inpatient services for at least 2 midnights  Expected Medical Readiness: 11/22/2022          B Medical/Surgery History Past Medical History:  Diagnosis Date   Acid reflux    Hyperlipidemia    Hypertension    PAD (peripheral artery disease) (HCC)    Renal artery occlusion (HCC)    1-59% left renal artery stenosis  by 08/17/16 [place]; known right renal artery occlusion since at least 09/20/00 by angiogram   Skin cancer of nose    Squamous cell cancer of skin of right shoulder    Past Surgical History:  Procedure Laterality Date   ABDOMINAL HYSTERECTOMY     CATARACT EXTRACTION, BILATERAL  2015   GANGLION CYST EXCISION     INSERTION OF MESH N/A 03/19/2020   Procedure: INSERTION OF MESH;  Surgeon: Manus Rudd, MD;  Location: [facility] OR;  Service: General;  Laterality: N/A;   JOINT REPLACEMENT     hip   PR VEIN BYPASS GRAFT,AORTO-FEM-POP  2005   Left Fem=pop graft by Dr. Brett Fairy HERNIA REPAIR N/A 03/19/2020   Procedure: OPEN VENTRAL HERNIA REPAIR WITH MESH;  Surgeon: Manus Rudd, MD;  Location: [facility] OR;  Service: General;  Laterality: N/A;     A IV Location/Drains/Wounds Patient Lines/Drains/Airways Status     Active Line/Drains/Airways     Name Placement date Placement time Site Days   Peripheral IV 11/17/22 20 G 1" Anterior;Left Forearm 11/17/22  2117  Forearm  less than 1   Incision (Closed) 03/19/20 Abdomen Left 03/19/20  1514  -- 973            Intake/Output Last 24 hours No intake or output data in the 24 hours ending 11/17/22 2126  Labs/Imaging Results for orders placed or performed during the hospital encounter of 11/17/22 (from the past 48 hour(s))  CBG monitoring, ED  Status: Abnormal   Collection Time: 11/17/22  5:08 PM  Result Value Ref Range   Glucose-Capillary 113 (H) 70 - 99 mg/dL    Comment: Glucose reference range applies only to samples taken after fasting for at least 8 hours.   Comment 1 Notify RN    Comment 2 Document in Chart   Basic metabolic panel     Status: Abnormal   Collection Time: 11/17/22  5:11 PM  Result Value Ref Range   Sodium 124 (L) 135 - 145 mmol/L   Potassium 4.6 3.5 - 5.1 mmol/L   Chloride 91 (L) 98 - 111 mmol/L   CO2 20 (L) 22 - 32 mmol/L   Glucose, Bld 107 (H) 70 - 99 mg/dL    Comment: Glucose reference range applies only to samples  taken after fasting for at least 8 hours.   BUN 33 (H) 8 - 23 mg/dL   Creatinine, Ser 8.46 (H) 0.44 - 1.00 mg/dL   Calcium 9.2 8.9 - 96.2 mg/dL   GFR, Estimated 33 (L) >60 mL/min    Comment: (NOTE) Calculated using the CKD-EPI Creatinine Equation (2021)    Anion gap 13 5 - 15    Comment: Performed at [facility], [address], [place], [place] [address]  CBC     Status: None   Collection Time: 11/17/22  5:11 PM  Result Value Ref Range   WBC 8.2 4.0 - 10.5 K/uL   RBC 4.42 3.87 - 5.11 MIL/uL   Hemoglobin 13.3 12.0 - 15.0 g/dL   HCT 13.2 44.0 - 10.2 %   MCV 93.9 80.0 - 100.0 fL   MCH 30.1 26.0 - 34.0 pg   MCHC 32.0 30.0 - 36.0 g/dL   RDW 72.5 36.6 - 44.0 %   Platelets 223 150 - 400 K/uL   nRBC 0.0 0.0 - 0.2 %    Comment: Performed at [facility], [address] [place], [place] [address]   CT Head Wo Contrast  Result Date: 11/17/2022 CLINICAL DATA:  Head trauma, fall over the weekend.  Weakness. EXAM: CT HEAD WITHOUT CONTRAST TECHNIQUE: Contiguous axial images were obtained from the base of the skull through the vertex without intravenous contrast. RADIATION DOSE REDUCTION: This exam was performed according to the departmental dose-optimization program which includes automated exposure control, adjustment of the mA and/or kV according to patient size and/or use of iterative reconstruction technique. COMPARISON:  02/18/2022 FINDINGS: Brain: No acute intracranial hemorrhage. No subdural or extra-axial collection. Stable atrophy and chronic small vessel ischemic change. No midline shift or mass lesion/mass effect. Vascular: Atherosclerosis of skullbase vasculature without hyperdense vessel or abnormal calcification. Skull: No fracture or focal lesion. Sinuses/Orbits: Chronic opacification of left maxillary sinus. Chronic partial opacification of left mastoid air cells. Bilateral lens resection. Non fusion posterior arch of C1, partially included. Other: No confluent scalp  contusion. IMPRESSION: 1. No acute intracranial abnormality. No skull fracture. 2. Stable atrophy and chronic small vessel ischemic change. Electronically Signed   By: Narda Rutherford M.D.   On: 11/17/2022 18:41   DG Lumbar Spine Complete  Result Date: 11/17/2022 CLINICAL DATA:  Pain and weakness. EXAM: LUMBAR SPINE - COMPLETE 4+ VIEW COMPARISON:  None Available. FINDINGS: Five non-rib-bearing lumbar vertebra. Levo scoliotic curvature centered at L1-L2. There is grade 1 anterolisthesis of L5 on S1. No evidence of fracture. No compression deformity. Moderate diffuse degenerative disc disease and facet hypertrophy. Sacroiliac joints are congruent with mild degenerative change. Aortic atherosclerosis. IMPRESSION: 1. No  acute fracture. 2. Moderate diffuse degenerative disc disease and facet hypertrophy. 3. Levo scoliotic curvature centered at L1-L2. Electronically Signed   By: Narda Rutherford M.D.   On: 11/17/2022 18:36   DG Pelvis 1-2 Views  Result Date: 11/17/2022 CLINICAL DATA:  Pain and weakness.  Fall over the weekend. EXAM: PELVIS - 1-2 VIEW COMPARISON:  None Available. FINDINGS: Left hip arthroplasty, intact were visualized. The distal femoral stem is not entirely included in the field of view. No acute pelvic or periprosthetic fracture. The pubic rami are intact. Pubic symphysis and sacroiliac joints are congruent. Vascular calcifications are seen. Surgical clips in the left groin. IMPRESSION: No acute fracture of the pelvis. Left hip arthroplasty, included portion is intact. Electronically Signed   By: Narda Rutherford M.D.   On: 11/17/2022 18:34    Pending Labs Unresulted Labs (From admission, onward)     Start     Ordered   11/17/22 2017  Osmolality  Once,   URGENT        11/17/22 2016   11/17/22 2016  Osmolality, urine  Once,   URGENT        11/17/22 2016   11/17/22 1700  Urinalysis, w/ Reflex to Culture (Infection Suspected) -Urine, Clean Catch  Once,   URGENT       Question:  Specimen  Source  Answer:  Urine, Clean Catch   11/17/22 1659            Vitals/Pain Today's Vitals   11/17/22 2000 11/17/22 2015 11/17/22 2045 11/17/22 2115  BP: (!) 166/123 (!) 160/134 (!) 173/73 (!) 142/64  Pulse: 100 84 93 94  Resp: 10 (!) 33 12 13  Temp:    97.9 F (36.6 C)  TempSrc:    Oral  SpO2: 100% 99% 97% 99%  PainSc:        Isolation Precautions No active isolations  Medications Medications  sodium chloride 0.9 % bolus 500 mL (has no administration in time range)  0.9 %  sodium chloride infusion (has no administration in time range)    Mobility walks     Focused Assessments Cardiac Assessment Handoff:    Lab Results  Component Value Date   CKTOTAL 1,070 (H) 02/18/2022   No results found for: "DDIMER" Does the Patient currently have chest pain? No    R Recommendations: See Admitting Provider Note  Report given to:   Additional Notes:

## 2022-11-17 NOTE — ED Provider Notes (Signed)
[place] EMERGENCY DEPARTMENT AT [facility] Provider Note   CSN: [id number] Arrival date & time: 11/17/22  1612     History  Chief Complaint  Patient presents with   Weakness    Stacy Erickson is a 87 y.o. female.  Patient is an 87 year old female with a history of hyperlipidemia, hypertension, PAD, 1 functioning kidney who is presenting today with her family due to recurrent falls and confusion.  Last week patient was diagnosed with shingles and a UTI that grew out E. coli.  She initially was started on Valtrex and Keflex but on Monday was switched to cefdinir when the culture results returned.  Family reports that since Sunday patient has started having some mild confusion but has had recurrent falls.  Patient reports she feels a sensation come over her and her legs give out.  She does not lose consciousness have any chest pain or shortness of breath.  However she has hit her head and some of the falls and has now been complaining of pain in her hips and back.  All the falls have been on carpet or low to the ground.  She is now using a walker and can barely get around with that.  Family reports she usually lives independently has no history of dementia or confusion is able to drive her own car and live safely alone.  Patient reports that she is having some trouble remembering.  She has been eating and drinking per family there has not been any fevers.  She denies any abdominal pain or vomiting but has had some loose stool since being on the antibiotic.  She does not drink alcohol and denies excessive water intake.  The history is provided by the patient and a relative.  Weakness      Home Medications Prior to Admission medications   Medication Sig Start Date End Date Taking? Authorizing Provider  acetaminophen (TYLENOL) 500 MG tablet Take 1,000 mg by mouth every 6 (six) hours as needed for mild pain.   Yes [provider]  cefdinir (OMNICEF) 300 MG capsule Take 300 mg  by mouth 2 (two) times daily. 11/15/22  Yes [provider]  Cholecalciferol (VITAMIN D3) 1000 UNITS CAPS Take 3,000 Units by mouth daily.   Yes [provider]  Cyanocobalamin (B-12 COMPLIANCE INJECTION) 1000 MCG/ML KIT 1 IM 1x a month Injection 01/13/17  Yes [provider]  Ferrous Sulfate (IRON PO) Take 1 tablet by mouth. 3 times a week   Yes [provider]  furosemide (LASIX) 40 MG tablet Take 20 mg by mouth daily. 02/11/20  Yes [provider]  loratadine (CLARITIN) 10 MG tablet Take 10 mg by mouth daily.   Yes [provider]  Menthol, Topical Analgesic, (ASPERCREME MAX ROLL-ON EX) Apply 1 application topically every 6 (six) hours as needed (pain).   Yes [provider]  mirabegron ER (MYRBETRIQ) 50 MG TB24 tablet Take 50 mg by mouth daily.   Yes [provider]  Multiple Vitamin (MULTIVITAMIN) capsule Take 1 capsule by mouth daily.   Yes [provider]  nebivolol (BYSTOLIC) 10 MG tablet Take 10 mg by mouth daily. Take at bedtime   Yes [provider]  omeprazole (PRILOSEC) 20 MG capsule 1 capsule 30 minutes before morning meal Orally Once a day for 90 days   Yes [provider]  simvastatin (ZOCOR) 20 MG tablet 1-2 times weekly Monday and thursday   Yes [provider]  triamcinolone cream (KENALOG) 0.1 %  Apply 1 Application topically 3 (three) times daily.   Yes [provider]  valACYclovir (VALTREX) 1000 MG tablet Take 1,000 mg by mouth 2 (two) times daily. 11/12/22  Yes [provider]  valsartan (DIOVAN) 80 MG tablet Take 80 mg by mouth 2 (two) times daily. 12/23/14  Yes [provider]  cephALEXin (KEFLEX) 500 MG capsule Take 500 mg by mouth 2 (two) times daily. Patient not taking: Reported on 11/17/2022 11/12/22   [provider]  nicotine (NICODERM CQ - DOSED IN MG/24 HOURS) 14 mg/24hr patch Place 1 patch onto the skin daily. Patient not taking:  Reported on 11/17/2022    [provider]      Allergies    Codeine and Sulfa antibiotics    Review of Systems   Review of Systems  Neurological:  Positive for weakness.    Physical Exam Updated Vital Signs BP (!) 166/123   Pulse 100   Temp 97.6 F (36.4 C) (Oral)   Resp 10   SpO2 100%  Physical Exam Vitals and nursing note reviewed.  Constitutional:      General: She is not in acute distress.    Appearance: She is well-developed.  HENT:     Head: Normocephalic and atraumatic.  Eyes:     Pupils: Pupils are equal, round, and reactive to light.  Cardiovascular:     Rate and Rhythm: Regular rhythm. Tachycardia present.     Heart sounds: Normal heart sounds. No murmur heard.    No friction rub.  Pulmonary:     Effort: Pulmonary effort is normal.     Breath sounds: Normal breath sounds. No wheezing or rales.  Abdominal:     General: Bowel sounds are normal. There is no distension.     Palpations: Abdomen is soft.     Tenderness: There is no abdominal tenderness. There is no guarding or rebound.  Musculoskeletal:        General: No tenderness. Normal range of motion.     Comments: No edema.  Healing ecchymosis over the left scapula.  No midline spinal tenderness  Skin:    General: Skin is warm and dry.     Findings: No rash.     Comments: Healing vesicular rash in the left upper extremity  Neurological:     Mental Status: She is alert.     Cranial Nerves: No cranial nerve deficit.     Comments: Oriented to person and place but has a hard time staying on subject, seems to be forgetful.  Is able to follow commands.  Heel-to-shin is normal bilaterally.  No notable pronator drift in upper or lower extremities.  Psychiatric:        Behavior: Behavior normal.     ED Results / Procedures / Treatments   Labs (all labs ordered are listed, but only abnormal results are displayed) Labs Reviewed  BASIC METABOLIC PANEL - Abnormal; Notable for the following components:       Result Value   Sodium 124 (*)    Chloride 91 (*)    CO2 20 (*)    Glucose, Bld 107 (*)    BUN 33 (*)    Creatinine, Ser 1.55 (*)    GFR, Estimated 33 (*)    All other components within normal limits  CBG MONITORING, ED - Abnormal; Notable for the following components:   Glucose-Capillary 113 (*)    All other components within normal limits  CBC  URINALYSIS, W/ REFLEX TO CULTURE (INFECTION SUSPECTED)  OSMOLALITY, URINE  OSMOLALITY    EKG None  Radiology CT Head Wo Contrast  Result Date: 11/17/2022 CLINICAL DATA:  Head trauma, fall over the weekend.  Weakness. EXAM: CT HEAD WITHOUT CONTRAST TECHNIQUE: Contiguous axial images were obtained from the base of the skull through the vertex without intravenous contrast. RADIATION DOSE REDUCTION: This exam was performed according to the departmental dose-optimization program which includes automated exposure control, adjustment of the mA and/or kV according to patient size and/or use of iterative reconstruction technique. COMPARISON:  02/18/2022 FINDINGS: Brain: No acute intracranial hemorrhage. No subdural or extra-axial collection. Stable atrophy and chronic small vessel ischemic change. No midline shift or mass lesion/mass effect. Vascular: Atherosclerosis of skullbase vasculature without hyperdense vessel or abnormal calcification. Skull: No fracture or focal lesion. Sinuses/Orbits: Chronic opacification of left maxillary sinus. Chronic partial opacification of left mastoid air cells. Bilateral lens resection. Non fusion posterior arch of C1, partially included. Other: No confluent scalp contusion. IMPRESSION: 1. No acute intracranial abnormality. No skull fracture. 2. Stable atrophy and chronic small vessel ischemic change. Electronically Signed   By: Narda Rutherford M.D.   On: 11/17/2022 18:41   DG Lumbar Spine Complete  Result Date: 11/17/2022 CLINICAL DATA:  Pain and weakness. EXAM: LUMBAR SPINE - COMPLETE 4+ VIEW COMPARISON:  None  Available. FINDINGS: Five non-rib-bearing lumbar vertebra. Levo scoliotic curvature centered at L1-L2. There is grade 1 anterolisthesis of L5 on S1. No evidence of fracture. No compression deformity. Moderate diffuse degenerative disc disease and facet hypertrophy. Sacroiliac joints are congruent with mild degenerative change. Aortic atherosclerosis. IMPRESSION: 1. No acute fracture. 2. Moderate diffuse degenerative disc disease and facet hypertrophy. 3. Levo scoliotic curvature centered at L1-L2. Electronically Signed   By: Narda Rutherford M.D.   On: 11/17/2022 18:36   DG Pelvis 1-2 Views  Result Date: 11/17/2022 CLINICAL DATA:  Pain and weakness.  Fall over the weekend. EXAM: PELVIS - 1-2 VIEW COMPARISON:  None Available. FINDINGS: Left hip arthroplasty, intact were visualized. The distal femoral stem is not entirely included in the field of view. No acute pelvic or periprosthetic fracture. The pubic rami are intact. Pubic symphysis and sacroiliac joints are congruent. Vascular calcifications are seen. Surgical clips in the left groin. IMPRESSION: No acute fracture of the pelvis. Left hip arthroplasty, included portion is intact. Electronically Signed   By: Narda Rutherford M.D.   On: 11/17/2022 18:34    Procedures Procedures    Medications Ordered in ED Medications  sodium chloride 0.9 % bolus 500 mL (has no administration in time range)  0.9 %  sodium chloride infusion (has no administration in time range)    ED Course/ Medical Decision Making/ A&P                                 Medical Decision Making Amount and/or Complexity of Data Reviewed Independent Historian:     Details: Her children External Data Reviewed: notes. Labs: ordered. Decision-making details documented in ED Course. Radiology: ordered and independent interpretation performed. Decision-making details documented in ED Course. ECG/medicine tests: ordered and independent interpretation performed. Decision-making details  documented in ED Course.  Risk Prescription drug management. Decision regarding hospitalization.   Pt with multiple medical problems and comorbidities and presenting today with a complaint that caries a high risk for morbidity and mortality.  Here today with confusion and weakness with recurrent falls.  Concern for head injury, stroke, AKI and electrolyte abnormalities.  Patient is not displaying obvious signs of sepsis or infection at this time.  Also concern that patient's symptoms could be a result of medication.  No focal findings of stroke on exam.  I independently interpreted patient's labs and EKG.  EKG without acute findings.  CBC, CBG without acute findings, CMP with new hyponatremia with a sodium of 124, mild AKI with creatinine of 1.55 from her baseline of 1.  Per patient's MyChart account from her PCP in May sodium was 130.  She is on multiple blood pressure medications as well as Lasix.  I have independently visualized and interpreted pt's images today.  Pelvic imaging is negative lumbar spine without acute fracture radiology does report moderate diffuse DDD.  Head CT without acute bleed.  Radiology reports chronic small vessel disease.  Findings discussed with family.  Pt given IV bolus and started on rate.  Serum and urine osm pending.  Pt requires admission due to frequent falls, weakness and confusion.  Family and pt comfortable with this plan.          Final Clinical Impression(s) / ED Diagnoses Final diagnoses:  Hyponatremia  Acute metabolic encephalopathy    Rx / DC Orders ED Discharge Orders     None         Gwyneth Sprout, MD 11/17/22 2112

## 2022-11-17 NOTE — H&P (Signed)
History and Physical    Stacy Erickson DDU:[id number] DOB: Mar 09, 1936 DOA: 11/17/2022  PCP: Rodrigo Ran, MD  Patient coming from: home  I have personally briefly reviewed patient's old medical records in [facility]  Chief Complaint: weakness/ falls/ recent dx of UTI   HPI: Stacy Erickson is a 87 y.o. female with medical history significant of HLD, HTN , PAD , GERD , Renal a stenosis  b/l with total occlusion on right, squamous cell cancer of skin of right shoulder.  Patient presents to ED brought in family with weakness and confusion in setting of recent diagnosis of ecoli UTI. Of note patient lives alone and is typically able to complete IDLS and ADLS and now is progressively weak and also has element of confusion from baseline in addition to having multiple falls at home. Patient as out patient was treated with oral antibiotics first keflex  and then cefdinir once cultures returned. Per chart patient's culture returned pan-sensitive Ecoli. Per patient and family despite antibiotic she has not made much improvement. Patient states on standing she feels all the energy drain from her body and her legs give out. She notes she has not had any LOC during these episodes. She notes no associated n/v/ abdominal pain/ chest pain [name] Maia Plan or chills. She has however noted increase urinary frequency as well as dysuria at the onset of  feeling ill.  She notes she continues to feel fatigued which is not usual for her. He also notes during this period she was compliant with all her medications.   ED Course:  Afeb, BP 110/66 repeat 142/64,  HR 86 rr 18 , sat 100%   Labs: Wbc 8.2, hgb 13.3, plt 223 NA: 124 prior 129/130 cr 1.55 /1.04 CTH 1. No acute intracranial abnormality. No skull fracture. 2. Stable atrophy and chronic small vessel ischemic change.  Xray pelvis: 1. No acute intracranial abnormality. No skull fracture. 2. Stable atrophy and chronic small vessel ischemic change. Xray lumbar  spine IMPRESSION: 1. No acute fracture. 2. Moderate diffuse degenerative disc disease and facet hypertrophy. 3. Levo scoliotic curvature centered at L1-L2.   Review of Systems: As per HPI otherwise 10 point review of systems negative.   Past Medical History:  Diagnosis Date   Acid reflux    Hyperlipidemia    Hypertension    PAD (peripheral artery disease) (HCC)    Renal artery occlusion (HCC)    1-59% left renal artery stenosis by 08/17/16 [place]; known right renal artery occlusion since at least 09/20/00 by angiogram   Skin cancer of nose    Squamous cell cancer of skin of right shoulder     Past Surgical History:  Procedure Laterality Date   ABDOMINAL HYSTERECTOMY     CATARACT EXTRACTION, BILATERAL  2015   GANGLION CYST EXCISION     INSERTION OF MESH N/A 03/19/2020   Procedure: INSERTION OF MESH;  Surgeon: Manus Rudd, MD;  Location: [facility] OR;  Service: General;  Laterality: N/A;   JOINT REPLACEMENT     hip   PR VEIN BYPASS GRAFT,AORTO-FEM-POP  2005   Left Fem=pop graft by Dr. Brett Fairy HERNIA REPAIR N/A 03/19/2020   Procedure: OPEN VENTRAL HERNIA REPAIR WITH MESH;  Surgeon: Manus Rudd, MD;  Location: [facility] OR;  Service: General;  Laterality: N/A;     reports that she quit smoking about 21 years ago. Her smoking use included cigarettes. She started smoking about 56 years ago. She has a 70 pack-year smoking history. She has  never used smokeless tobacco. She reports current alcohol use of about 7.0 standard drinks of alcohol per week. She reports that she does not use drugs.  Allergies  Allergen Reactions   Codeine Nausea Only   Sulfa Antibiotics Itching and Nausea Only    Family History  Problem Relation Age of Onset   Heart disease Father    Cancer Sister    Liver disease Brother    Colon polyps Child    Colon polyps Child    Colon cancer Other 50   Esophageal cancer Neg Hx     Prior to Admission medications   Medication Sig Start Date End Date Taking? Authorizing  Provider  acetaminophen (TYLENOL) 500 MG tablet Take 1,000 mg by mouth every 6 (six) hours as needed for mild pain.    [provider]  chlorhexidine (PERIDEX) 0.12 % solution SMARTSIG:15 By Mouth Twice Daily 04/06/21   [provider]  Cholecalciferol (VITAMIN D3) 1000 UNITS CAPS Take 3,000 Units by mouth daily.    [provider]  Cyanocobalamin (B-12 COMPLIANCE INJECTION) 1000 MCG/ML KIT 1 IM 1x a month Injection 01/13/17   [provider]  fluticasone (FLONASE) 50 MCG/ACT nasal spray Place 1 spray into both nostrils at bedtime as needed for allergies or rhinitis.    [provider]  furosemide (LASIX) 40 MG tablet Take 20 mg by mouth daily. 02/11/20   [provider]  ibuprofen (ADVIL) 200 MG tablet Take 200 mg by mouth every 6 (six) hours as needed for mild pain.    [provider]  Lidocaine HCl (ASPERCREME LIDOCAINE) 4 % CREA Aspercreme (lidocaine) 4 % topical patch    [provider]  loratadine (CLARITIN) 10 MG tablet Take 10 mg by mouth daily.    [provider]  LORazepam (ATIVAN) 0.5 MG tablet 1/2 to one tablet up to once a day as needed for anxiety Orally Once a day as needed for 30 days 01/07/21   [provider]  Menthol, Topical Analgesic, (ASPERCREME MAX ROLL-ON EX) Apply 1 application topically every 6 (six) hours as needed (pain).    [provider]  Multiple Vitamin (MULTIVITAMIN) capsule Take 1 capsule by mouth daily.    [provider]  nebivolol (BYSTOLIC) 10 MG tablet Take 10 mg by mouth daily. Take at bedtime    [provider]  nicotine (NICODERM CQ - DOSED IN MG/24 HOURS) 14 mg/24hr patch Place 1 patch onto the skin daily.    [provider]  omeprazole (PRILOSEC) 20 MG capsule 1 capsule 30 minutes before morning meal Orally Once a day for 90 days    [provider]  Probiotic Product (PROBIOTIC PO) Take 1 capsule by mouth daily.    [provider]  simvastatin (ZOCOR) 20 MG tablet 1 tablet in the evening Orally Once a day for 90 days    [provider]  valsartan (DIOVAN) 80 MG tablet Take 40 mg by mouth 2 (two) times daily. 12/23/14   [provider]    Physical Exam: Vitals:   11/17/22 1622 11/17/22 2000  BP: 110/66 (!) 166/123  Pulse: 86 100  Resp: 18 10  Temp: 97.6 F (36.4 C)   TempSrc: Oral   SpO2: 100% 100%    Constitutional: NAD, calm, comfortable Vitals:   11/17/22 1622 11/17/22 2000  BP: 110/66 (!) 166/123  Pulse: 86 100  Resp: 18 10  Temp: 97.6 F (36.4 C)   TempSrc: Oral   SpO2: 100% 100%  Eyes: PERRL, lids and conjunctivae normal ENMT: Mucous membranes are moist. Posterior pharynx clear of any exudate or lesions.Normal dentition.  Neck: normal, supple, no masses, no thyromegaly Respiratory: clear to auscultation bilaterally, no wheezing, no crackles. Normal respiratory effort. No accessory muscle use.  Cardiovascular: Regular rate and rhythm, no murmurs / rubs / gallops. No extremity edema. 2+ pedal pulses Abdomen: no tenderness, no masses palpated. No hepatosplenomegaly. Bowel sounds positive.  Musculoskeletal: no clubbing / cyanosis. No joint deformity upper and lower extremities. Good ROM, no contractures. Normal muscle tone.  Skin: no rashes, lesions, ulcers. No induration Neurologic: CN 2-12 grossly intact. Sensation intact, mild confusion, Strength 5/5 in all 4.  Psychiatric: Alert and oriented x 3. Normal mood.    Labs on Admission: I have personally reviewed following labs and imaging studies  CBC: Recent Labs  Lab 11/17/22 1711  WBC 8.2  HGB 13.3  HCT 41.5  MCV 93.9  PLT 223   Basic Metabolic Panel: Recent Labs  Lab 11/17/22 1711  NA 124*  K 4.6  CL 91*  CO2 20*  GLUCOSE 107*  BUN 33*  CREATININE 1.55*  CALCIUM 9.2   GFR: CrCl cannot be calculated (Unknown ideal weight.). Liver Function Tests: No results for input(s): "AST", "ALT",  "ALKPHOS", "BILITOT", "PROT", "ALBUMIN" in the last 168 hours. No results for input(s): "LIPASE", "AMYLASE" in the last 168 hours. No results for input(s): "AMMONIA" in the last 168 hours. Coagulation Profile: No results for input(s): "INR", "PROTIME" in the last 168 hours. Cardiac Enzymes: No results for input(s): "CKTOTAL", "CKMB", "CKMBINDEX", "TROPONINI" in the last 168 hours. BNP (last 3 results) No results for input(s): "PROBNP" in the last 8760 hours. HbA1C: No results for input(s): "HGBA1C" in the last 72 hours. CBG: Recent Labs  Lab 11/17/22 1708  GLUCAP 113*   Lipid Profile: No results for input(s): "CHOL", "HDL", "LDLCALC", "TRIG", "CHOLHDL", "LDLDIRECT" in the last 72 hours. Thyroid Function Tests: No results for input(s): "TSH", "T4TOTAL", "FREET4", "T3FREE", "THYROIDAB" in the last 72 hours. Anemia Panel: No results for input(s): "VITAMINB12", "FOLATE", "FERRITIN", "TIBC", "IRON", "RETICCTPCT" in the last 72 hours. Urine analysis:    Component Value Date/Time   COLORURINE YELLOW 02/18/2022 2129   APPEARANCEUR HAZY (A) 02/18/2022 2129   LABSPEC 1.012 02/18/2022 2129   PHURINE 6.0 02/18/2022 2129   GLUCOSEU NEGATIVE 02/18/2022 2129   HGBUR MODERATE (A) 02/18/2022 2129   BILIRUBINUR NEGATIVE 02/18/2022 2129   KETONESUR 5 (A) 02/18/2022 2129   PROTEINUR >=300 (A) 02/18/2022 2129   NITRITE NEGATIVE 02/18/2022 2129   LEUKOCYTESUR NEGATIVE 02/18/2022 2129    Radiological Exams on Admission: CT Head Wo Contrast  Result Date: 11/17/2022 CLINICAL DATA:  Head trauma, fall over the weekend.  Weakness. EXAM: CT HEAD WITHOUT CONTRAST TECHNIQUE: Contiguous axial images were obtained from the base of the skull through the vertex without intravenous contrast. RADIATION DOSE REDUCTION: This exam was performed according to the departmental dose-optimization program which includes automated exposure control, adjustment of the mA and/or kV according to patient size and/or use of  iterative reconstruction technique. COMPARISON:  02/18/2022 FINDINGS: Brain: No acute intracranial hemorrhage. No subdural or extra-axial collection. Stable atrophy and chronic small vessel ischemic change. No midline shift or mass lesion/mass effect. Vascular: Atherosclerosis of skullbase vasculature without hyperdense vessel or abnormal calcification. Skull: No fracture or focal lesion. Sinuses/Orbits: Chronic opacification of left maxillary sinus. Chronic partial opacification of left mastoid air cells. Bilateral lens resection. Non fusion posterior arch of C1, partially included. Other: No confluent  scalp contusion. IMPRESSION: 1. No acute intracranial abnormality. No skull fracture. 2. Stable atrophy and chronic small vessel ischemic change. Electronically Signed   By: Narda Rutherford M.D.   On: 11/17/2022 18:41   DG Lumbar Spine Complete  Result Date: 11/17/2022 CLINICAL DATA:  Pain and weakness. EXAM: LUMBAR SPINE - COMPLETE 4+ VIEW COMPARISON:  None Available. FINDINGS: Five non-rib-bearing lumbar vertebra. Levo scoliotic curvature centered at L1-L2. There is grade 1 anterolisthesis of L5 on S1. No evidence of fracture. No compression deformity. Moderate diffuse degenerative disc disease and facet hypertrophy. Sacroiliac joints are congruent with mild degenerative change. Aortic atherosclerosis. IMPRESSION: 1. No acute fracture. 2. Moderate diffuse degenerative disc disease and facet hypertrophy. 3. Levo scoliotic curvature centered at L1-L2. Electronically Signed   By: Narda Rutherford M.D.   On: 11/17/2022 18:36   DG Pelvis 1-2 Views  Result Date: 11/17/2022 CLINICAL DATA:  Pain and weakness.  Fall over the weekend. EXAM: PELVIS - 1-2 VIEW COMPARISON:  None Available. FINDINGS: Left hip arthroplasty, intact were visualized. The distal femoral stem is not entirely included in the field of view. No acute pelvic or periprosthetic fracture. The pubic rami are intact. Pubic symphysis and sacroiliac  joints are congruent. Vascular calcifications are seen. Surgical clips in the left groin. IMPRESSION: No acute fracture of the pelvis. Left hip arthroplasty, included portion is intact. Electronically Signed   By: Narda Rutherford M.D.   On: 11/17/2022 18:34    EKG: Independently reviewed. N/a Assessment/Plan  Partially treated UTI with associated Acute Metabolic Encephalopathy - admit to tele  -start on ctx  - out patient culture pan-sensitive Ecoli  - repeat culture pending ,tailor abx as necessary   AKI -in setting of uti, lasix /arb use -hold nephrotoxic  - ivfs  - renal u/s imaging insetting of infection /aki  Acute Metabolic Encephalopathy  - multifactorial due to uti/ aki/ electrolyte abnormality -treat underlying causes  -to be complete ammonia/vbg pending  -of note CTH NAD   Hyponatremia -has chronic lower NA around 129/130  - presumed hypovolemic hyponatremia in setting of lasix use/not adequate intake of fluids - serum osmo pending  -hold  lasix  - monitor labs   Hypertension - in setting of renal artery stenosis  - stable control on lasix, nebivolol losartan  -due to aki holding lasix, losartan  -will place oral and prn hydralazine  -resume home regimen once renal function back to baseline     HLD -resume statin    GERD -ppi    Renal a stenosis  b/l  -with total occlusion on right   Squamous cell cancer of skin of right shoulder -s/p treatment   DVT prophylaxis: heparin Code Status: full/ as discussed per patient wishes in event of cardiac arrest  Family Communication:   Alternate Contact Person      Crews,Laura (Daughter) [phone] (Work Phone)   Disposition Plan: full/ as discussed per patient wishes in event of cardiac arrest  Consults called: n/a Admission status: med tele   Lurline Del MD [facility]   If 7PM-7AM, please contact night-coverage [email or web address] Password [email or web address]  11/17/2022, 8:50 PM

## 2022-11-17 NOTE — ED Notes (Signed)
The pt has shingle rash to her lt forearm  she has had it over one week

## 2022-11-17 NOTE — ED Notes (Signed)
I called [facility]  they are ready for this pt

## 2022-11-17 NOTE — ED Notes (Signed)
Attempted urine sample in triage, pt could not provide UA at this time.

## 2022-11-17 NOTE — ED Notes (Signed)
Admitting doctor at bedside 

## 2022-11-18 ENCOUNTER — Inpatient Hospital Stay (HOSPITAL_COMMUNITY): Payer: Medicare Other

## 2022-11-18 DIAGNOSIS — E871 Hypo-osmolality and hyponatremia: Secondary | ICD-10-CM

## 2022-11-18 DIAGNOSIS — G9341 Metabolic encephalopathy: Secondary | ICD-10-CM | POA: Diagnosis not present

## 2022-11-18 LAB — BASIC METABOLIC PANEL
Anion gap: 15 (ref 5–15)
BUN: 36 mg/dL — ABNORMAL HIGH (ref 8–23)
CO2: 22 mmol/L (ref 22–32)
Calcium: 9.1 mg/dL (ref 8.9–10.3)
Chloride: 90 mmol/L — ABNORMAL LOW (ref 98–111)
Creatinine, Ser: 1.53 mg/dL — ABNORMAL HIGH (ref 0.44–1.00)
GFR, Estimated: 33 mL/min — ABNORMAL LOW (ref >60)
Glucose, Bld: 97 mg/dL (ref 70–99)
Potassium: 3.8 mmol/L (ref 3.5–5.1)
Sodium: 127 mmol/L — ABNORMAL LOW (ref 135–145)

## 2022-11-18 LAB — COMPREHENSIVE METABOLIC PANEL
ALT: 46 U/L — ABNORMAL HIGH (ref 0–44)
AST: 51 U/L — ABNORMAL HIGH (ref 15–41)
Albumin: 3.7 g/dL (ref 3.5–5.0)
Alkaline Phosphatase: 53 U/L (ref 38–126)
Anion gap: 15 (ref 5–15)
BUN: 29 mg/dL — ABNORMAL HIGH (ref 8–23)
CO2: 20 mmol/L — ABNORMAL LOW (ref 22–32)
Calcium: 9.2 mg/dL (ref 8.9–10.3)
Chloride: 93 mmol/L — ABNORMAL LOW (ref 98–111)
Creatinine, Ser: 1.25 mg/dL — ABNORMAL HIGH (ref 0.44–1.00)
GFR, Estimated: 42 mL/min — ABNORMAL LOW (ref >60)
Glucose, Bld: 95 mg/dL (ref 70–99)
Potassium: 3.7 mmol/L (ref 3.5–5.1)
Sodium: 128 mmol/L — ABNORMAL LOW (ref 135–145)
Total Bilirubin: 0.5 mg/dL (ref 0.3–1.2)
Total Protein: 6.9 g/dL (ref 6.5–8.1)

## 2022-11-18 LAB — CBC
HCT: 38.1 % (ref 36.0–46.0)
HCT: 39.2 % (ref 36.0–46.0)
Hemoglobin: 12.8 g/dL (ref 12.0–15.0)
Hemoglobin: 13.3 g/dL (ref 12.0–15.0)
MCH: 29.6 pg (ref 26.0–34.0)
MCH: 29.8 pg (ref 26.0–34.0)
MCHC: 33.6 g/dL (ref 30.0–36.0)
MCHC: 33.9 g/dL (ref 30.0–36.0)
MCV: 87.7 fL (ref 80.0–100.0)
MCV: 88.2 fL (ref 80.0–100.0)
Platelets: 244 10*3/uL (ref 150–400)
Platelets: 246 10*3/uL (ref 150–400)
RBC: 4.32 MIL/uL (ref 3.87–5.11)
RBC: 4.47 MIL/uL (ref 3.87–5.11)
RDW: 13.4 % (ref 11.5–15.5)
RDW: 13.6 % (ref 11.5–15.5)
WBC: 7.1 10*3/uL (ref 4.0–10.5)
WBC: 7.8 10*3/uL (ref 4.0–10.5)
nRBC: 0 % (ref 0.0–0.2)
nRBC: 0 % (ref 0.0–0.2)

## 2022-11-18 LAB — BLOOD GAS, VENOUS
Acid-base deficit: 1.8 mmol/L (ref 0.0–2.0)
Bicarbonate: 23.7 mmol/L (ref 20.0–28.0)
O2 Saturation: 70.2 %
Patient temperature: 36.9
pCO2, Ven: 42 mmHg — ABNORMAL LOW (ref 44–60)
pH, Ven: 7.36 (ref 7.25–7.43)
pO2, Ven: 41 mmHg (ref 32–45)

## 2022-11-18 LAB — URINALYSIS, W/ REFLEX TO CULTURE (INFECTION SUSPECTED)
Bilirubin Urine: NEGATIVE
Glucose, UA: NEGATIVE mg/dL
Ketones, ur: 5 mg/dL — AB
Nitrite: NEGATIVE
Protein, ur: 30 mg/dL — AB
Specific Gravity, Urine: 1.012 (ref 1.005–1.030)
pH: 5 (ref 5.0–8.0)

## 2022-11-18 LAB — OSMOLALITY, URINE: Osmolality, Ur: 381 mosm/kg (ref 300–900)

## 2022-11-18 LAB — AMMONIA: Ammonia: <10 umol/L (ref 9–35)

## 2022-11-18 LAB — MAGNESIUM: Magnesium: 2 mg/dL (ref 1.7–2.4)

## 2022-11-18 LAB — HEMOGLOBIN A1C
Hgb A1c MFr Bld: 5.8 % — ABNORMAL HIGH (ref 4.8–5.6)
Mean Plasma Glucose: 119.76 mg/dL

## 2022-11-18 LAB — TSH: TSH: 2.474 u[IU]/mL (ref 0.350–4.500)

## 2022-11-18 MED ORDER — ORAL CARE MOUTH RINSE: 15.0000 mL | OROMUCOSAL | Status: DC | PRN

## 2022-11-18 MED ORDER — ROPINIROLE HCL 1 MG PO TABS
0.5000 mg | ORAL_TABLET | Freq: Every day | ORAL | Status: DC
  Administered 2022-11-18: 0.5 mg via ORAL
  Filled 2022-11-18: qty 1

## 2022-11-18 NOTE — Evaluation (Signed)
Physical Therapy Evaluation Patient Details Name: Stacy Erickson MRN: [id number] DOB: 07-13-1935 Today's Date: 11/18/2022  History of Present Illness  87 y.o. female admitted 9/4 with confusion and falls. Pt with UTI and encephalopathy. PMHx: HLD, HTN , PAD , GERD , Renal a stenosis, skin CA, THA  Clinical Impression  Pt pleasantly confused and incontinent on arrival with max assist for pericare and linen change. Pt impulsive with posterior bias needing significant min-mod assist for all transfers and gait. Pt lives alone and is normally independent with family that can provide assist short term  but all children works and cannot provide long term assist. Pt and family in agreement of any possible post acute rehab. Pt with above and below deficits who will benefit from acute therapy to maximize mobility, safety, balance and function to decrease burden of care.       If plan is discharge home, recommend the following: A little help with walking and/or transfers;Assistance with cooking/housework;Assist for transportation;Supervision due to cognitive status;Direct supervision/assist for medications management;Direct supervision/assist for financial management;A lot of help with bathing/dressing/bathroom   Can travel by private vehicle   Yes    Equipment Recommendations BSC/3in1  Recommendations for Other Services  OT consult    Functional Status Assessment Patient has had a recent decline in their functional status and/or demonstrates limited ability to make significant improvements in function in a reasonable and predictable amount of time     Precautions / Restrictions Precautions Precautions: Fall;Other (comment) Precaution Comments: incontinent      Mobility  Bed Mobility Overal bed mobility: Needs Assistance Bed Mobility: Rolling, Sidelying to Sit Rolling: Min assist Sidelying to sit: Min assist       General bed mobility comments: min assist to roll and rise from surface with  cues for sequence    Transfers Overall transfer level: Needs assistance   Transfers: Sit to/from Stand Sit to Stand: Mod assist           General transfer comment: posterior bias in standing with physical assist to stabilize, cues for hand placement and safety    Ambulation/Gait Ambulation/Gait assistance: Mod assist Gait Distance (Feet): 100 Feet Assistive device: Rolling walker (2 wheels) Gait Pattern/deviations: Step-through pattern, Trunk flexed, Shuffle, Drifts right/left   Gait velocity interpretation: <1.8 ft/sec, indicate of risk for recurrent falls   General Gait Details: pt with veering right and left, frequently letting go of RW to reach for environment, self posterior to RW, flexed trunk. Max cues for posture, proximity to RW, safety and direction. Pt unable to recall or find room number with cues  Stairs            Wheelchair Mobility     Tilt Bed    Modified Rankin (Stroke Patients Only)       Balance Overall balance assessment: Needs assistance, History of Falls Sitting-balance support: Feet supported, No upper extremity supported Sitting balance-Leahy Scale: Fair     Standing balance support: Bilateral upper extremity supported, Reliant on assistive device for balance, During functional activity Standing balance-Leahy Scale: Poor Standing balance comment: UB support on RW and physical assist in standing with posterior bias                             Pertinent Vitals/Pain Pain Assessment Pain Assessment: No/denies pain    Home Living Family/patient expects to be discharged to:: Private residence Living Arrangements: Alone Available Help at Discharge: Family;Available 24 hours/day (family can provide  short term 24 hr assist but all kids work) Type of Home: House Home Access: Stairs to enter   [employer] of Steps: 2   Home Layout: One level Home Equipment: Agricultural consultant (2 wheels);Standard Walker;Cane -  quad;Cane - single point;Shower seat;Tub bench;Grab bars - tub/shower      Prior Function Prior Level of Function : Independent/Modified Independent;Driving             Mobility Comments: until the last few weeks pt living alone, driving, not using AD. PTA started using RW due to weakness and confusion with 2-3 falls ADLs Comments: normally independent, family assisting with medication     Extremity/Trunk Assessment   Upper Extremity Assessment Upper Extremity Assessment: Generalized weakness    Lower Extremity Assessment Lower Extremity Assessment: RLE deficits/detail;LLE deficits/detail;Overall [facility] for tasks assessed RLE Deficits / Details: hip flexion 3/5 but unsure pt comprehending cues to resist, knee extension and flexion 4/5 LLE Deficits / Details: hip flexion 3/5 but unsure pt comprehending cues to resist, knee extension and flexion 4/5    Cervical / Trunk Assessment Cervical / Trunk Assessment: Kyphotic  Communication   Communication Communication: No apparent difficulties  Cognition Arousal: Alert Behavior During Therapy: Impulsive Overall Cognitive Status: Impaired/Different from baseline Area of Impairment: Memory, Attention, Orientation, Following commands, Safety/judgement, Problem solving                 Orientation Level: Disoriented to, Time, Situation Current Attention Level: Sustained Memory: Decreased short-term memory Following Commands: Follows one step commands inconsistently, Follows one step commands with increased time Safety/Judgement: Decreased awareness of safety, Decreased awareness of deficits   Problem Solving: Slow processing General Comments: pt frequently grabbing for items in environment despite cues for safety with RW. PT stating month as Oct despite stating a child just had a Sept birthday        General Comments      Exercises     Assessment/Plan    PT Assessment Patient needs continued PT services  PT Problem List  Decreased mobility;Decreased safety awareness;Decreased activity tolerance;Decreased cognition;Decreased balance;Decreased knowledge of use of DME;Decreased coordination       PT Treatment Interventions DME instruction;Gait training;Stair training;Functional mobility training;Therapeutic activities;Patient/family education;Cognitive remediation;Neuromuscular re-education;Balance training;Therapeutic exercise    PT Goals (Current goals can be found in the Care Plan section)  Acute Rehab PT Goals Patient Stated Goal: return home, walk safely PT Goal Formulation: With patient/family Time For Goal Achievement: 12/02/22 Potential to Achieve Goals: Fair    Frequency Min 1X/week     Co-evaluation               AM-PAC PT "6 Clicks" Mobility  Outcome Measure Help needed turning from your back to your side while in a flat bed without using bedrails?: A Little Help needed moving from lying on your back to sitting on the side of a flat bed without using bedrails?: A Little Help needed moving to and from a bed to a chair (including a wheelchair)?: A Lot Help needed standing up from a chair using your arms (e.g., wheelchair or bedside chair)?: A Lot Help needed to walk in hospital room?: A Lot Help needed climbing 3-5 steps with a railing? : Total 6 Click Score: 13    End of Session Equipment Utilized During Treatment: Gait belt Activity Tolerance: Patient tolerated treatment well Patient left: in chair;with call bell/phone within reach;with chair alarm set Nurse Communication: Mobility status PT Visit Diagnosis: Other abnormalities of gait and mobility (R26.89);History of falling (  Z91.81);Unsteadiness on feet (R26.81)    Time: 1610-9604 PT Time Calculation (min) (ACUTE ONLY): 30 min   Charges:   PT Evaluation $PT Eval Moderate Complexity: 1 Mod PT Treatments $Gait Training: 8-22 mins PT General Charges $$ ACUTE PT VISIT: 1 Visit         Merryl Hacker, PT Acute Rehabilitation  Services Office: [phone]   Enedina Finner Slevin Gunby 11/18/2022, 12:28 PM

## 2022-11-18 NOTE — [place] FL2 (Signed)
[place] MEDICAID FL2 LEVEL OF CARE FORM     IDENTIFICATION  Patient Name: Stacy Erickson Birthdate: Mar 04, 1936 Sex: female Admission Date (Current Location): 11/17/2022  [facility] and [place] Number:  Producer, television/film/video and Address:  [facility] [place], [address] [place], [place] [address]      Provider Number: [id number]  Attending Physician Name and Address:  Jerald Kief, MD  Relative Name and Phone Number:  Crews,Laura Daughter [phone]    Current Level of Care: Hospital Recommended Level of Care: Skilled Nursing Facility Prior Approval Number:    Date Approved/Denied:   PASRR Number: [id number]  Discharge Plan: SNF    Current Diagnoses: Patient Active Problem List   Diagnosis Date Noted   Acute metabolic encephalopathy 11/17/2022   Heme positive stool 07/15/2022   Low ferritin 07/15/2022   Obstructive chronic bronchitis without exacerbation 09/01/2021   Deficiency of other specified B group vitamins 09/01/2021   Atrioventricular block, first degree 09/01/2021   Atherosclerosis of aorta (HCC) 09/01/2021   Essential hypertension 09/01/2021   Pain in joint of left shoulder 04/12/2019   Impingement syndrome of left shoulder region 04/12/2019   PAD (peripheral artery disease) (HCC) 01/21/2015   PVD (peripheral vascular disease) (HCC) 12/14/2011    Orientation RESPIRATION BLADDER Height & Weight     Self, Situation, Place  Normal Incontinent Weight:   Height:     BEHAVIORAL SYMPTOMS/MOOD NEUROLOGICAL BOWEL NUTRITION STATUS      Continent Diet (heart healthy with thin liquids)  AMBULATORY STATUS COMMUNICATION OF NEEDS Skin   Limited Assist Verbally Other (Comment) (bruising to arms)                       Personal Care Assistance Level of Assistance  Bathing, Feeding, Dressing Bathing Assistance: Limited assistance Feeding assistance: Independent Dressing Assistance: Limited assistance     Functional  Limitations Info  Sight, Hearing, Speech Sight Info: Impaired (glasses) Hearing Info: Impaired Speech Info: Adequate    SPECIAL CARE FACTORS FREQUENCY  PT (By licensed PT), OT (By licensed OT)     PT Frequency: 5x/wk OT Frequency: 5x/wk            Contractures Contractures Info: Not present    Additional Factors Info  Code Status, Allergies Code Status Info: full Allergies Info: codeine/ sulfa antibiotics           Current Medications (11/18/2022):  This is the current hospital active medication list Current Facility-Administered Medications  Medication Dose Route Frequency Provider Last Rate Last Admin   0.9 %  sodium chloride infusion   Intravenous Continuous Lurline Del, MD 75 mL/hr at 11/17/22 2327 Infusion Verify at 11/17/22 2327   acetaminophen (TYLENOL) tablet 1,000 mg  1,000 mg Oral Q6H PRN Skip Mayer A, MD   1,000 mg at 11/18/22 0356   albuterol (PROVENTIL) (2.5 MG/3ML) 0.083% nebulizer solution 2.5 mg  2.5 mg Nebulization Q2H PRN Skip Mayer A, MD       cefTRIAXone (ROCEPHIN) 1 g in sodium chloride 0.9 % 100 mL IVPB  1 g Intravenous Q24H Skip Mayer A, MD 200 mL/hr at 11/17/22 2327 Infusion Verify at 11/17/22 2327   heparin injection 5,000 Units  5,000 Units Subcutaneous Q8H Skip Mayer A, MD   5,000 Units at 11/18/22 1441   hydrALAZINE (APRESOLINE) injection 10 mg  10 mg Intravenous Q6H PRN Lurline Del, MD   10 mg at 11/18/22 0357   hydrALAZINE (APRESOLINE) tablet 50  mg  50 mg Oral Q8H Skip Mayer A, MD   50 mg at 11/18/22 1441   mirabegron ER (MYRBETRIQ) tablet 50 mg  50 mg Oral Daily Lurline Del, MD   50 mg at 11/18/22 2130   multivitamin with minerals tablet 1 tablet  1 tablet Oral Daily Lurline Del, MD   1 tablet at 11/18/22 0814   nebivolol (BYSTOLIC) tablet 10 mg  10 mg Oral Daily Skip Mayer A, MD   10 mg at 11/18/22 0814   ondansetron (ZOFRAN) tablet 4 mg  4 mg Oral Q6H PRN Lurline Del, MD       Or   ondansetron [facility]) injection 4 mg  4 mg Intravenous Q6H PRN Lurline Del, MD       Oral care mouth rinse  15 mL Mouth Rinse PRN Jerald Kief, MD       pantoprazole (PROTONIX) EC tablet 40 mg  40 mg Oral Daily Skip Mayer A, MD   40 mg at 11/18/22 0814   simvastatin (ZOCOR) tablet 20 mg  20 mg Oral q1800 Lurline Del, MD       valACYclovir (VALTREX) tablet 1,000 mg  1,000 mg Oral BID Lurline Del, MD   1,000 mg at 11/18/22 8657     Discharge Medications: Please see discharge summary for a list of discharge medications.  Relevant Imaging Results:  Relevant Lab Results:   Additional Information SS#: [id number]  Kermit Balo, RN

## 2022-11-18 NOTE — TOC Initial Note (Addendum)
Transition of Care [facility] - Initial/Assessment Note    Patient Details  Name: Stacy Erickson MRN: [id number] Date of Birth: 10-22-1935  Transition of Care [facility] CM/SW Contact:    Kermit Balo, RN Phone Number: 11/18/2022, 10:55 AM  Clinical Narrative:                 CM met with the patient and her daughter at the bedside. Pt continues with some confusion.  PT/OT ordered due to pt weakness. Await their evals for home needs.  Pt lives alone but family is usually there over weekend.  Family provides pts needed transportation. Pt unsure if taking medications 100 % correctly at home. Family has started a pill box with her this last week.  TOC following.  1454: Recommendations are for SNF rehab. Pt and daughter in agreement. They asked she be faxed out in the [facility] area. CM provided them with choice on facilities from [email or web address]. Will follow up in am with bed offers.   Expected Discharge Plan: Home/Self Care Barriers to Discharge: Continued Medical Work up   Patient Goals and CMS Choice   CMS Medicare.gov Compare Post Acute Care list provided to:: Patient Represenative (must comment) Choice offered to / list presented to : Adult Children      Expected Discharge Plan and Services   Discharge Planning Services: CM Consult   Living arrangements for the past 2 months: Single Family Home                                      Prior Living Arrangements/Services Living arrangements for the past 2 months: Single Family Home Lives with:: Self Patient language and need for interpreter reviewed:: Yes Do you feel safe going back to the place where you live?: Yes      Need for Family Participation in Patient Care: Yes (Comment)   Current home services: DME (cane/ walker/ shower seat) Criminal Activity/Legal Involvement Pertinent to Current Situation/Hospitalization: No - Comment as needed  Activities of Daily Living Home Assistive Devices/Equipment: Cane (specify quad  or straight) ADL Screening (condition at time of admission) Patient's cognitive ability adequate to safely complete daily activities?: Yes Is the patient deaf or have difficulty hearing?: No Does the patient have difficulty seeing, even when wearing glasses/contacts?: No Does the patient have difficulty concentrating, remembering, or making decisions?: No Patient able to express need for assistance with ADLs?: Yes Does the patient have difficulty dressing or bathing?: No Independently performs ADLs?: Yes (appropriate for developmental age) Does the patient have difficulty walking or climbing stairs?: Yes Weakness of Legs: Both Weakness of Arms/Hands: None  Permission Sought/Granted                  Emotional Assessment Appearance:: Appears stated age Attitude/Demeanor/Rapport: Engaged Affect (typically observed): Accepting Orientation: : Oriented to Self, Oriented to Place   Psych Involvement: No (comment)  Admission diagnosis:  Hyponatremia [E87.1] Acute metabolic encephalopathy [G93.41] Patient Active Problem List   Diagnosis Date Noted   Acute metabolic encephalopathy 11/17/2022   Heme positive stool 07/15/2022   Low ferritin 07/15/2022   Obstructive chronic bronchitis without exacerbation 09/01/2021   Deficiency of other specified B group vitamins 09/01/2021   Atrioventricular block, first degree 09/01/2021   Atherosclerosis of aorta (HCC) 09/01/2021   Essential hypertension 09/01/2021   Pain in joint of left shoulder 04/12/2019   Impingement syndrome of left shoulder region 04/12/2019  PAD (peripheral artery disease) (HCC) 01/21/2015   PVD (peripheral vascular disease) (HCC) 12/14/2011   PCP:  Rodrigo Ran, MD Pharmacy:   [facility] [place], [place] - [address] [place] [address] Phone: [phone] Fax: [phone]     Social Determinants of Health (SDOH) Social History: SDOH Screenings    Food Insecurity: No Food Insecurity (11/17/2022)  Housing: Low Risk  (11/17/2022)  Transportation Needs: No Transportation Needs (11/17/2022)  Utilities: Not At Risk (11/17/2022)  Depression (PHQ2-9): Low Risk  (09/01/2021)  Financial Resource Strain: Low Risk  (09/01/2021)  Social Connections: Moderately Integrated (09/01/2021)  Stress: No Stress Concern Present (09/01/2021)  Tobacco Use: Medium Risk (11/17/2022)   SDOH Interventions:     Readmission Risk Interventions     No data to display

## 2022-11-18 NOTE — Progress Notes (Signed)
PT Cancellation Note  Patient Details Name: Stacy Erickson MRN: [id number] DOB: [birth year]   Cancelled Treatment:    Reason Eval/Treat Not Completed: Patient at procedure or test/unavailable (hx and home setup obtained with pt taken to [place])   Decorey Wahlert B [name] 11/18/2022, 11:10 AM Merryl Hacker, PT Acute Rehabilitation Services Office: [phone]

## 2022-11-18 NOTE — Hospital Course (Signed)
87 y.o. female with medical history significant of HLD, HTN , PAD , GERD , Renal a stenosis  b/l with total occlusion on right, squamous cell cancer of skin of right shoulder.  Patient presents to ED brought in family with weakness and confusion in setting of recent diagnosis of ecoli UTI. Of note patient lives alone and is typically able to complete IDLS and ADLS and now is progressively weak and also has element of confusion from baseline in addition to having multiple falls at home. Patient as out patient was treated with oral antibiotics first keflex  and then cefdinir once cultures returned. Per chart patient's culture returned pan-sensitive Ecoli. Per patient and family despite antibiotic she has not made much improvement. Patient states on standing she feels all the energy drain from her body and her legs give out. She notes she has not had any LOC during these episodes. She notes no associated n/v/ abdominal pain/ chest pain [name] Maia Plan or chills. She has however noted increase urinary frequency as well as dysuria at the onset of  feeling ill.  She notes she continues to feel fatigued which is not usual for her. He also notes during this period she was compliant with all her medications.

## 2022-11-18 NOTE — Progress Notes (Signed)
Progress Note   Patient: Stacy Erickson QVZ:[id number] DOB: 04-13-1935 DOA: 11/17/2022     1 DOS: the patient was seen and examined on 11/18/2022   Brief hospital course: 87 y.o. female with medical history significant of HLD, HTN , PAD , GERD , Renal a stenosis  b/l with total occlusion on right, squamous cell cancer of skin of right shoulder.  Patient presents to ED brought in family with weakness and confusion in setting of recent diagnosis of ecoli UTI. Of note patient lives alone and is typically able to complete IDLS and ADLS and now is progressively weak and also has element of confusion from baseline in addition to having multiple falls at home. Patient as out patient was treated with oral antibiotics first keflex  and then cefdinir once cultures returned. Per chart patient's culture returned pan-sensitive Ecoli. Per patient and family despite antibiotic she has not made much improvement. Patient states on standing she feels all the energy drain from her body and her legs give out. She notes she has not had any LOC during these episodes. She notes no associated n/v/ abdominal pain/ chest pain [name] Maia Plan or chills. She has however noted increase urinary frequency as well as dysuria at the onset of  feeling ill.  She notes she continues to feel fatigued which is not usual for her. He also notes during this period she was compliant with all her medications.   Assessment and Plan: UTI with associated Acute Metabolic Encephalopathy after treatment with oral abx PTA - out patient culture pan-sensitive Ecoli per report - repeat culture pending - Agree with continuing empiric rocephin for now - recheck cbc and bmet in AM   AKI -in setting of uti, lasix /arb use -hold nephrotoxic agents - cont IVF as tolerated - renal u/s performed, pending results  Acute Metabolic Encephalopathy  - multifactorial due to uti/ aki/ electrolyte abnormality -ammonia normal - Cont IVF as tolerated    Hyponatremia -has chronic lower NA around 129/130  - presumed hypovolemic hyponatremia in setting of lasix use/not adequate intake of fluids - holding lasix -Cont IVF as tolerated   Hypertension - in setting of renal artery stenosis  - BP currently stable - continued on po hydralazine      HLD -resume statin     GERD -ppi     Renal a stenosis  b/l  -with total occlusion on right    Squamous cell cancer of skin of right shoulder -s/p treatment        Subjective: Feeling weak this AM, without other complaints  Physical Exam: Vitals:   11/18/22 0500 11/18/22 0749 11/18/22 1151 11/18/22 1521  BP: (!) 133/55 (!) 134/56 (!) 122/59 (!) 126/54  Pulse: 78 81 86 93  Resp: (!) 22     Temp:  98.1 F (36.7 C) 97.7 F (36.5 C) 99 F (37.2 C)  TempSrc:  Oral Oral Oral  SpO2:  97% 96% 96%   General exam: Awake, laying in bed, in nad Respiratory system: Normal respiratory effort, no wheezing Cardiovascular system: regular rate, s1, s2 Gastrointestinal system: Soft, nondistended, positive BS Central nervous system: CN2-12 grossly intact, strength intact Extremities: Perfused, no clubbing Skin: Normal skin turgor, no notable skin lesions seen Psychiatry: Mood normal // no visual hallucinations   Data Reviewed:  Labs reviewed: Na 128, K 3.7, Cr 1.53 -> 1.25, WBC 7.8  Family Communication: Pt in room, family at bedside  Disposition: Status is: Inpatient Remains inpatient appropriate because: severity of illness  Planned  Discharge Destination: Skilled nursing facility    Author: Rickey Barbara, MD 11/18/2022 3:43 PM  For on call review [email or web address].

## 2022-11-18 NOTE — Evaluation (Signed)
Occupational Therapy Evaluation Patient Details Name: Stacy Erickson MRN: [id number] DOB: Mar 14, 1936 Today's Date: 11/18/2022   History of Present Illness 87 y.o. female admitted 9/4 with confusion and falls. Pt with UTI and encephalopathy. PMHx: HLD, HTN , PAD , GERD , Renal a stenosis, skin CA, THA   Clinical Impression   Patient admitted for the diagnosis above.  PTA she lives alone at home and has assist from the family for community mobility, medications, meals, and heavy home management.  Currently she presents with the deficits below, needing Mod A for basic transfers and lower body ADL.  OT will follow in the acute setting and Patient will benefit from continued inpatient follow up therapy, <3 hours/day.        If plan is discharge home, recommend the following: A lot of help with walking and/or transfers;A lot of help with bathing/dressing/bathroom;Assistance with cooking/housework;Assist for transportation    Functional Status Assessment  Patient has had a recent decline in their functional status and demonstrates the ability to make significant improvements in function in a reasonable and predictable amount of time.  Equipment Recommendations  BSC/3in1    Recommendations for Other Services       Precautions / Restrictions Precautions Precautions: Fall Precaution Comments: incontinent Restrictions Weight Bearing Restrictions: No      Mobility Bed Mobility               General bed mobility comments: up in recliner    Transfers Overall transfer level: Needs assistance   Transfers: Sit to/from Stand Sit to Stand: Mod assist                  Balance Overall balance assessment: Needs assistance, History of Falls Sitting-balance support: Feet supported, No upper extremity supported Sitting balance-Leahy Scale: Fair     Standing balance support: Reliant on assistive device for balance Standing balance-Leahy Scale: Poor                              ADL either performed or assessed with clinical judgement   ADL Overall ADL's : Needs assistance/impaired Eating/Feeding: Set up;Sitting   Grooming: Wash/dry hands;Wash/dry face;Oral care;Set up;Sitting   Upper Body Bathing: Set up;Sitting   Lower Body Bathing: Moderate assistance;Sit to/from stand   Upper Body Dressing : Set up;Sitting   Lower Body Dressing: Moderate assistance;Sit to/from stand   Toilet Transfer: Moderate assistance;Rolling walker (2 wheels);Regular Toilet;Ambulation                   Vision Patient Visual Report: No change from baseline       Perception Perception: Not tested       Praxis Praxis: Not tested       Pertinent Vitals/Pain Pain Assessment Pain Assessment: No/denies pain     Extremity/Trunk Assessment Upper Extremity Assessment Upper Extremity Assessment: Overall WFL for tasks assessed   Lower Extremity Assessment Lower Extremity Assessment: Defer to PT evaluation RLE Deficits / Details: hip flexion 3/5 but unsure pt comprehending cues to resist, knee extension and flexion 4/5 LLE Deficits / Details: hip flexion 3/5 but unsure pt comprehending cues to resist, knee extension and flexion 4/5   Cervical / Trunk Assessment Cervical / Trunk Assessment: Kyphotic   Communication Communication Communication: No apparent difficulties   Cognition Arousal: Alert Behavior During Therapy: Impulsive Overall Cognitive Status: No family/caregiver present to determine baseline cognitive functioning  Current Attention Level: Sustained Memory: Decreased short-term memory   Safety/Judgement: Decreased awareness of safety, Decreased awareness of deficits           General Comments   VSS on RA    Exercises     Shoulder Instructions      Home Living Family/patient expects to be discharged to:: Private residence Living Arrangements: Alone Available Help at Discharge: Family;Available 24  hours/day Type of Home: House Home Access: Stairs to enter [employer] of Steps: 2   Home Layout: One level     Bathroom Shower/Tub: Chief Strategy Officer: Handicapped height Bathroom Accessibility: Yes How Accessible: Accessible via walker Home Equipment: Rolling Walker (2 wheels);Standard Walker;Cane - quad;Cane - single point;Shower seat;Tub bench;Grab bars - tub/shower          Prior Functioning/Environment Prior Level of Function : Independent/Modified Independent;Driving             Mobility Comments: until the last few weeks pt living alone, driving, not using AD. PTA started using RW due to weakness and confusion with 2-3 falls ADLs Comments: normally independent, family assisting with medications and meals on the weekend.  Assist with community mobility.        OT Problem List: Decreased strength;Decreased activity tolerance;Impaired balance (sitting and/or standing);Decreased safety awareness      OT Treatment/Interventions: Self-care/ADL training;Therapeutic activities;Patient/family education;Balance training;DME and/or AE instruction    OT Goals(Current goals can be found in the care plan section) Acute Rehab OT Goals Patient Stated Goal: Return home OT Goal Formulation: With patient Time For Goal Achievement: 12/02/22 Potential to Achieve Goals: Good  OT Frequency: Min 1X/week    Co-evaluation              AM-PAC OT "6 Clicks" Daily Activity     Outcome Measure Help from another person eating meals?: None Help from another person taking care of personal grooming?: None Help from another person toileting, which includes using toliet, bedpan, or urinal?: A Lot Help from another person bathing (including washing, rinsing, drying)?: A Lot Help from another person to put on and taking off regular upper body clothing?: None Help from another person to put on and taking off regular lower body clothing?: A Lot 6 Click Score: 18    End of Session Equipment Utilized During Treatment: Gait belt;Rolling walker (2 wheels) Nurse Communication: Mobility status  Activity Tolerance: Patient tolerated treatment well Patient left: in chair;with call bell/phone within reach;with chair alarm set  OT Visit Diagnosis: Unsteadiness on feet (R26.81);Muscle weakness (generalized) (M62.81);History of falling (Z91.81)                Time: 1240-1300 OT Time Calculation (min): 20 min Charges:  OT General Charges $OT Visit: 1 Visit OT Evaluation $OT Eval Moderate Complexity: 1 Mod  11/18/2022  RP, OTR/L  Acute Rehabilitation Services  Office:  [phone]   Suzanna Obey 11/18/2022, 2:27 PM

## 2022-11-19 DIAGNOSIS — E871 Hypo-osmolality and hyponatremia: Secondary | ICD-10-CM | POA: Diagnosis not present

## 2022-11-19 DIAGNOSIS — G9341 Metabolic encephalopathy: Secondary | ICD-10-CM | POA: Diagnosis not present

## 2022-11-19 LAB — COMPREHENSIVE METABOLIC PANEL
ALT: 42 U/L (ref 0–44)
AST: 45 U/L — ABNORMAL HIGH (ref 15–41)
Albumin: 3.3 g/dL — ABNORMAL LOW (ref 3.5–5.0)
Alkaline Phosphatase: 49 U/L (ref 38–126)
Anion gap: 12 (ref 5–15)
BUN: 16 mg/dL (ref 8–23)
CO2: 21 mmol/L — ABNORMAL LOW (ref 22–32)
Calcium: 8.8 mg/dL — ABNORMAL LOW (ref 8.9–10.3)
Chloride: 93 mmol/L — ABNORMAL LOW (ref 98–111)
Creatinine, Ser: 1.07 mg/dL — ABNORMAL HIGH (ref 0.44–1.00)
GFR, Estimated: 51 mL/min — ABNORMAL LOW (ref >60)
Glucose, Bld: 119 mg/dL — ABNORMAL HIGH (ref 70–99)
Potassium: 4 mmol/L (ref 3.5–5.1)
Sodium: 126 mmol/L — ABNORMAL LOW (ref 135–145)
Total Bilirubin: 0.7 mg/dL (ref 0.3–1.2)
Total Protein: 6.1 g/dL — ABNORMAL LOW (ref 6.5–8.1)

## 2022-11-19 LAB — OSMOLALITY, URINE: Osmolality, Ur: 456 mOsm/kg (ref 300–900)

## 2022-11-19 LAB — CBC
HCT: 35.8 % — ABNORMAL LOW (ref 36.0–46.0)
Hemoglobin: 12 g/dL (ref 12.0–15.0)
MCH: 29.7 pg (ref 26.0–34.0)
MCHC: 33.5 g/dL (ref 30.0–36.0)
MCV: 88.6 fL (ref 80.0–100.0)
Platelets: 289 10*3/uL (ref 150–400)
RBC: 4.04 MIL/uL (ref 3.87–5.11)
RDW: 13.8 % (ref 11.5–15.5)
WBC: 8.4 10*3/uL (ref 4.0–10.5)
nRBC: 0 % (ref 0.0–0.2)

## 2022-11-19 LAB — OSMOLALITY: Osmolality: 266 mosm/kg — ABNORMAL LOW (ref 275–295)

## 2022-11-19 LAB — SODIUM, URINE, RANDOM: Sodium, Ur: 54 mmol/L

## 2022-11-19 MED ORDER — SALINE SPRAY 0.65 % NA SOLN
1.0000 | NASAL | Status: DC | PRN
  Administered 2022-11-19 – 2022-11-22 (×5): 1 via NASAL
  Filled 2022-11-19: qty 44

## 2022-11-19 MED ORDER — ROPINIROLE HCL 1 MG PO TABS
0.5000 mg | ORAL_TABLET | Freq: Two times a day (BID) | ORAL | Status: DC
  Administered 2022-11-19 – 2022-11-20 (×2): 0.5 mg via ORAL
  Filled 2022-11-19 (×2): qty 1

## 2022-11-19 MED ORDER — ROPINIROLE HCL 1 MG PO TABS
0.5000 mg | ORAL_TABLET | Freq: Three times a day (TID) | ORAL | Status: DC
  Administered 2022-11-19: 0.5 mg via ORAL
  Filled 2022-11-19: qty 1

## 2022-11-19 MED ORDER — LOPERAMIDE HCL 2 MG PO CAPS
2.0000 mg | ORAL_CAPSULE | ORAL | Status: DC | PRN
  Administered 2022-11-19 – 2022-11-25 (×8): 2 mg via ORAL
  Filled 2022-11-19 (×8): qty 1

## 2022-11-19 MED ORDER — HALOPERIDOL LACTATE 5 MG/ML IJ SOLN
2.0000 mg | Freq: Once | INTRAMUSCULAR | Status: AC
  Administered 2022-11-19: 2 mg via INTRAVENOUS
  Filled 2022-11-19: qty 1

## 2022-11-19 NOTE — Plan of Care (Signed)
?  Problem: Clinical Measurements: ?Goal: Will remain free from infection ?Outcome: Not Progressing ?  ?Problem: Clinical Measurements: ?Goal: Diagnostic test results will improve ?Outcome: Not Progressing ?  ?Problem: Nutrition: ?Goal: Adequate nutrition will be maintained ?Outcome: Not Progressing ?  ?Problem: Safety: ?Goal: Ability to remain free from injury will improve ?Outcome: Not Progressing ?  ?

## 2022-11-19 NOTE — Care Management Important Message (Signed)
Important Message  Patient Details  Name: Stacy Erickson MRN: [id number] Date of Birth: 12/13/1935   Medicare Important Message Given:  Yes     Rameen Gohlke Stefan Church 11/19/2022, 2:41 PM

## 2022-11-19 NOTE — Progress Notes (Signed)
Progress Note   Patient: Stacy Erickson HYQ:[id number] DOB: 1935-03-22 DOA: 11/17/2022     2 DOS: the patient was seen and examined on 11/19/2022   Brief hospital course: 87 y.o. female with medical history significant of HLD, HTN , PAD , GERD , Renal a stenosis  b/l with total occlusion on right, squamous cell cancer of skin of right shoulder.  Patient presents to ED brought in family with weakness and confusion in setting of recent diagnosis of ecoli UTI. Of note patient lives alone and is typically able to complete IDLS and ADLS and now is progressively weak and also has element of confusion from baseline in addition to having multiple falls at home. Patient as out patient was treated with oral antibiotics first keflex  and then cefdinir once cultures returned. Per chart patient's culture returned pan-sensitive Ecoli. Per patient and family despite antibiotic she has not made much improvement. Patient states on standing she feels all the energy drain from her body and her legs give out. She notes she has not had any LOC during these episodes. She notes no associated n/v/ abdominal pain/ chest pain [name] Maia Plan or chills. She has however noted increase urinary frequency as well as dysuria at the onset of  feeling ill.  She notes she continues to feel fatigued which is not usual for her. He also notes during this period she was compliant with all her medications.   Assessment and Plan: UTI with associated Acute Metabolic Encephalopathy after treatment with oral abx PTA - out patient culture pan-sensitive Ecoli per report - repeat culture pending - Agree with continuing empiric rocephin for now - recheck cbc and bmet in AM   AKI -in setting of uti, lasix /arb use -hold nephrotoxic agents - cont IVF as tolerated - renal u/s performed, pending results  Acute Metabolic Encephalopathy  - multifactorial due to uti/ aki/ electrolyte abnormality -ammonia normal - Cont IVF as tolerated    Hyponatremia -has chronic lower NA around 129/130  - presumed hypovolemic hyponatremia in setting of lasix use/not adequate intake of fluids - holding lasix -Receiving IVF -Na a little lower today. Recheck sodium studies   Hypertension - in setting of renal artery stenosis  - BP currently stable - continued on po hydralazine      HLD -resume statin     GERD -ppi     Renal a stenosis  b/l  -with total occlusion on right    Squamous cell cancer of skin of right shoulder -s/p treatment   Resless leg -giving trial of requip       Subjective: More tired this AM  Physical Exam: Vitals:   11/19/22 0528 11/19/22 0757 11/19/22 1137 11/19/22 1548  BP: (!) 162/59 (!) 155/70 (!) 148/61 (!) 144/62  Pulse: 85 86 80 77  Resp:      Temp: 97.7 F (36.5 C) 97.8 F (36.6 C) 98.2 F (36.8 C) 98 F (36.7 C)  TempSrc: Oral Oral Oral Oral  SpO2: 97% 94% 98% 98%   General exam: Conversant, in no acute distress Respiratory system: normal chest rise, clear, no audible wheezing Cardiovascular system: regular rhythm, s1-s2 Gastrointestinal system: Nondistended, nontender, pos BS Central nervous system: No seizures, no tremors Extremities: No cyanosis, no joint deformities Skin: No rashes, no pallor Psychiatry: Affect normal // no auditory hallucinations   Data Reviewed:  Labs reviewed: Na 126, K 4.0, Cr 1.07, WBC 8.4  Family Communication: Pt in room, family at bedside  Disposition: Status is: Inpatient Remains inpatient  appropriate because: severity of illness  Planned Discharge Destination: Skilled nursing facility    Author: Rickey Barbara, MD 11/19/2022 4:51 PM  For on call review [email or web address].

## 2022-11-19 NOTE — TOC Progression Note (Addendum)
Transition of Care [facility]) - Progression Note    Patient Details  Name: Stacy Erickson MRN: [id number] Date of Birth: [birth year]  Transition of Care [facility]) CM/SW Contact  Kermit Balo, RN Phone Number: 11/19/2022, 10:06 AM  Clinical Narrative:     Offers provided to the patient and her daughter for SNF rehab.  TOC following.  1200: [name] selected and accepted. They will have a bed for her on Sunday.   Expected Discharge Plan: Skilled Nursing Facility Barriers to Discharge: Continued Medical Work up  Expected Discharge Plan and Services   Discharge Planning Services: CM Consult   Living arrangements for the past 2 months: Single Family Home                                       Social Determinants of Health (SDOH) Interventions SDOH Screenings   Food Insecurity: No Food Insecurity (11/17/2022)  Housing: Low Risk  (11/17/2022)  Transportation Needs: No Transportation Needs (11/17/2022)  Utilities: Not At Risk (11/17/2022)  Depression (PHQ2-9): Low Risk  (09/01/2021)  Financial Resource Strain: Low Risk  (09/01/2021)  Social Connections: Moderately Integrated (09/01/2021)  Stress: No Stress Concern Present (09/01/2021)  Tobacco Use: Medium Risk (11/17/2022)    Readmission Risk Interventions     No data to display

## 2022-11-20 DIAGNOSIS — E871 Hypo-osmolality and hyponatremia: Secondary | ICD-10-CM | POA: Diagnosis not present

## 2022-11-20 DIAGNOSIS — G9341 Metabolic encephalopathy: Secondary | ICD-10-CM | POA: Diagnosis not present

## 2022-11-20 LAB — CBC
HCT: 36 % (ref 36.0–46.0)
Hemoglobin: 12 g/dL (ref 12.0–15.0)
MCH: 29.3 pg (ref 26.0–34.0)
MCHC: 33.3 g/dL (ref 30.0–36.0)
MCV: 88 fL (ref 80.0–100.0)
Platelets: 303 10*3/uL (ref 150–400)
RBC: 4.09 MIL/uL (ref 3.87–5.11)
RDW: 14.1 % (ref 11.5–15.5)
WBC: 6.7 10*3/uL (ref 4.0–10.5)
nRBC: 0 % (ref 0.0–0.2)

## 2022-11-20 LAB — COMPREHENSIVE METABOLIC PANEL
ALT: 36 U/L (ref 0–44)
AST: 34 U/L (ref 15–41)
Albumin: 3.2 g/dL — ABNORMAL LOW (ref 3.5–5.0)
Alkaline Phosphatase: 51 U/L (ref 38–126)
Anion gap: 5 (ref 5–15)
BUN: 10 mg/dL (ref 8–23)
CO2: 22 mmol/L (ref 22–32)
Calcium: 8.3 mg/dL — ABNORMAL LOW (ref 8.9–10.3)
Chloride: 94 mmol/L — ABNORMAL LOW (ref 98–111)
Creatinine, Ser: 0.99 mg/dL (ref 0.44–1.00)
GFR, Estimated: 56 mL/min — ABNORMAL LOW (ref >60)
Glucose, Bld: 114 mg/dL — ABNORMAL HIGH (ref 70–99)
Potassium: 4.2 mmol/L (ref 3.5–5.1)
Sodium: 121 mmol/L — ABNORMAL LOW (ref 135–145)
Total Bilirubin: 0.6 mg/dL (ref 0.3–1.2)
Total Protein: 6.1 g/dL — ABNORMAL LOW (ref 6.5–8.1)

## 2022-11-20 MED ORDER — ROPINIROLE HCL 1 MG PO TABS
0.5000 mg | ORAL_TABLET | Freq: Every day | ORAL | Status: DC
  Administered 2022-11-20 – 2022-11-25 (×6): 0.5 mg via ORAL
  Filled 2022-11-20 (×5): qty 1

## 2022-11-20 MED ORDER — SIMETHICONE 80 MG PO CHEW
80.0000 mg | CHEWABLE_TABLET | Freq: Four times a day (QID) | ORAL | Status: DC | PRN
  Administered 2022-11-20 – 2022-11-25 (×3): 80 mg via ORAL
  Filled 2022-11-20 (×3): qty 1

## 2022-11-20 MED ORDER — ROPINIROLE HCL 1 MG PO TABS
0.5000 mg | ORAL_TABLET | Freq: Every day | ORAL | Status: DC
  Filled 2022-11-20: qty 1

## 2022-11-20 MED ORDER — AYR SALINE NASAL NA GEL
1.0000 | NASAL | Status: DC | PRN
  Administered 2022-11-21: 1 via NASAL
  Filled 2022-11-20: qty 14.1

## 2022-11-20 MED ORDER — TOLVAPTAN 15 MG PO TABS
15.0000 mg | ORAL_TABLET | Freq: Once | ORAL | Status: AC
  Administered 2022-11-20: 15 mg via ORAL
  Filled 2022-11-20: qty 1

## 2022-11-20 NOTE — Progress Notes (Signed)
Progress Note   Patient: Stacy Erickson [id number]:[id number] DOB: [birth year] DOA: 11/17/2022     3 DOS: the patient was seen and examined on 11/20/2022   Brief hospital course: 87 y.o. female with medical history significant of HLD, HTN , PAD , GERD , Renal a stenosis  b/l with total occlusion on right, squamous cell cancer of skin of right shoulder.  Patient presents to ED brought in family with weakness and confusion in setting of recent diagnosis of ecoli UTI. Of note patient lives alone and is typically able to complete IDLS and ADLS and now is progressively weak and also has element of confusion from baseline in addition to having multiple falls at home. Patient as out patient was treated with oral antibiotics first keflex  and then cefdinir once cultures returned. Per chart patient's culture returned pan-sensitive Ecoli. Per patient and family despite antibiotic she has not made much improvement. Patient states on standing she feels all the energy drain from her body and her legs give out. She notes she has not had any LOC during these episodes. She notes no associated n/v/ abdominal pain/ chest pain [name] Maia Plan or chills. She has however noted increase urinary frequency as well as dysuria at the onset of  feeling ill.  She notes she continues to feel fatigued which is not usual for her. He also notes during this period she was compliant with all her medications.   Assessment and Plan: UTI with associated Acute Metabolic Encephalopathy after treatment with oral abx PTA - out patient culture pan-sensitive Ecoli per report - repeat culture remains pending - continued on empiric rocephin for now - recheck cbc and bmet in AM   AKI -in setting of uti, lasix /arb use -held nephrotoxic agents - was given IVF, now held per below - renal u/s performed, reviewed. Chronically atrophic R kidney, otherwise unremarkable -Cr has normalized  Acute Metabolic Encephalopathy  - multifactorial due to uti/ aki/  electrolyte abnormality -ammonia normal - Mentation seems improved, albeit appears drowsy this AM, however, suspect may be related to requip   Chronic Hyponatremia -baseline Na around 129/130  - Initially presumed hypovolemic hyponatremia in setting of lasix use/not adequate intake of fluids - held lasix and received IVF with improvement in Na -Na trended down 9/6 with sodium studies repeated. Finding of lower serum osm of 266, urine osm 456, and urine Na 54 -Held IVF and placed on 1200cc fluid restriction. Repeat Na today 121 -Consulted Nephrology for assistance. Pt appears euvolemic on exam   Hypertension - in setting of renal artery stenosis  - BP currently stable - continued on po hydralazine     HLD -cont statin     GERD -ppi     Renal a stenosis  b/l  -with total occlusion on right    Squamous cell cancer of skin of right shoulder -s/p treatment   Resless leg -now much improved with trial of requip -More drowsy this AM, will reduce dose to at bedtime only      Subjective: Tired this AM. States RLS is now much improved  Physical Exam: Vitals:   11/20/22 0810 11/20/22 1101 11/20/22 1217 11/20/22 1540  BP: (!) 178/70 (!) 126/55 (!) 176/79 (!) 158/71  Pulse: 77 74  81  Resp:  20  20  Temp: 97.8 F (36.6 C) 97.8 F (36.6 C)  97.8 F (36.6 C)  TempSrc: Oral Oral  Oral  SpO2: 97% 99%  98%  Weight:       General  exam: Awake, laying in bed, in nad Respiratory system: Normal respiratory effort, no wheezing Cardiovascular system: regular rate, s1, s2 Gastrointestinal system: Soft, nondistended, positive BS Central nervous system: CN2-12 grossly intact, strength intact Extremities: Perfused, no clubbing Skin: Normal skin turgor, no notable skin lesions seen Psychiatry: Mood normal // no visual hallucinations   Data Reviewed:  Labs reviewed: Na 121, K 4.2, Cr 0.99, WBC 6.7  Family Communication: Pt in room, family at bedside  Disposition: Status is:  Inpatient Remains inpatient appropriate because: severity of illness  Planned Discharge Destination: Skilled nursing facility    Author: Rickey Barbara, MD 11/20/2022 5:52 PM  For on call review [email or web address].

## 2022-11-20 NOTE — Consult Note (Signed)
Renal Service Consult Note [facility]  Stacy Erickson 11/20/2022 Stacy Krabbe, MD Requesting Physician: Dr. Rhona Leavens  Reason for Consult: Hyponatremia HPI: The patient is a 87 y.o. year-old w/ PMH as below who presented to ED 9/04 c/o gen'd weakness.  Prior hx of HTN, PAD, R renal stenosis w/ atrophic kidney. Recently had Ecoli UTI rx'd in outpt setting. Also has been struggling w/ restless legs and R hip/ groin pains. She also last week had a shingles episode on her L arm which is mostly resolved by now. She is still taking the valtrex here.  Pt was admitted for [facility] w/ recent UTI and started on IV rocephin. There was mild AKI and lasix and ARB were held and she rec'd IVF's. Renal US showed atrophic R kidney (known) and normal appearing L kidney. Creat improved from 1.5 on admission down to 0.9 today. Her home BB and hydralazine were continued for BP control. Na+ was 124 initially in the ED then 127 later that day. Next day 9/05 Na was 128, then yesterday 9/06 Na+ level was 126. But today the Na+ dropped to 121. We are asked to see for hyponatremia.   Pt seen in room. Daughter is present and answers most of the questions. She talks about her R groin discomfort and her restless legs. No SOB, no leg swelling.  Home losartan and lasix have been held in hospital. Getting home BB and hydralazine for BP control here. She did get fair amount of isotonic IVF's w/ 75 cc/hr for 2 days and bolus 500 cc in ED initially.   ROS - denies CP, no joint pain, no HA, no blurry vision, no rash, no diarrhea, no nausea/ vomiting, no dysuria, no difficulty voiding   Past Medical History  Past Medical History:  Diagnosis Date   Acid reflux    Hyperlipidemia    Hypertension    PAD (peripheral artery disease) (HCC)    Renal artery occlusion (HCC)    1-59% left renal artery stenosis by 08/17/16 [place]; known right renal artery occlusion since at least 09/20/00 by angiogram   Skin cancer of nose    Squamous  cell cancer of skin of right shoulder    Past Surgical History  Past Surgical History:  Procedure Laterality Date   ABDOMINAL HYSTERECTOMY     CATARACT EXTRACTION, BILATERAL  2015   GANGLION CYST EXCISION     INSERTION OF MESH N/A 03/19/2020   Procedure: INSERTION OF MESH;  Surgeon: Manus Rudd, MD;  Location: [facility] OR;  Service: General;  Laterality: N/A;   JOINT REPLACEMENT     hip   PR VEIN BYPASS GRAFT,AORTO-FEM-POP  2005   Left Fem=pop graft by Dr. Brett Fairy HERNIA REPAIR N/A 03/19/2020   Procedure: OPEN VENTRAL HERNIA REPAIR WITH MESH;  Surgeon: Manus Rudd, MD;  Location: [facility] OR;  Service: General;  Laterality: N/A;   Family History  Family History  Problem Relation Age of Onset   Heart disease Father    Cancer Sister    Liver disease Brother    Colon polyps Child    Colon polyps Child    Colon cancer Other 50   Esophageal cancer Neg Hx    Social History  reports that she quit smoking about 21 years ago. Her smoking use included cigarettes. She started smoking about 56 years ago. She has a 70 pack-year smoking history. She has never used smokeless tobacco. She reports current alcohol use of about 7.0 standard drinks of  alcohol per week. She reports that she does not use drugs. Allergies  Allergies  Allergen Reactions   Codeine Nausea Only   Sulfa Antibiotics Itching and Nausea Only   Home medications Prior to Admission medications   Medication Sig Start Date End Date Taking? Authorizing Provider  acetaminophen (TYLENOL) 500 MG tablet Take 1,000 mg by mouth every 6 (six) hours as needed for mild pain.   Yes [provider]  cefdinir (OMNICEF) 300 MG capsule Take 300 mg by mouth 2 (two) times daily. 11/15/22  Yes [provider]  Cholecalciferol (VITAMIN D3) 1000 UNITS CAPS Take 3,000 Units by mouth daily.   Yes [provider]  Cyanocobalamin (B-12 COMPLIANCE INJECTION) 1000 MCG/ML KIT 1 IM 1x a month Injection 01/13/17  Yes [provider]  Ferrous Sulfate (IRON PO) Take 1 tablet by mouth. 3 times a week   Yes [provider]  furosemide (LASIX) 40 MG tablet Take 20 mg by mouth daily. 02/11/20  Yes [provider]  loratadine (CLARITIN) 10 MG tablet Take 10 mg by mouth daily.   Yes [provider]  Menthol, Topical Analgesic, (ASPERCREME MAX ROLL-ON EX) Apply 1 application topically every 6 (six) hours as needed (pain).   Yes [provider]  mirabegron ER (MYRBETRIQ) 50 MG TB24 tablet Take 50 mg by mouth daily.   Yes [provider]  Multiple Vitamin (MULTIVITAMIN) capsule Take 1 capsule by mouth daily.   Yes [provider]  nebivolol (BYSTOLIC) 10 MG tablet Take 10 mg by mouth daily. Take at bedtime   Yes [provider]  omeprazole (PRILOSEC) 20 MG capsule 1 capsule 30 minutes before morning meal Orally Once a day for 90 days   Yes [provider]  simvastatin (ZOCOR) 20 MG tablet 1-2 times weekly Monday and thursday   Yes [provider]  triamcinolone cream (KENALOG) 0.1 % Apply 1 Application topically 3 (three) times daily.   Yes [provider]  valACYclovir (VALTREX) 1000 MG tablet Take 1,000 mg by mouth 2 (two) times daily. 11/12/22  Yes [provider]  valsartan (DIOVAN) 80 MG tablet Take 80 mg by mouth 2 (two) times daily. 12/23/14  Yes [provider]  cephALEXin (KEFLEX) 500 MG capsule Take 500 mg by mouth 2 (two) times daily. Patient not taking: Reported on 11/17/2022 11/12/22   [provider]  nicotine (NICODERM CQ - DOSED IN MG/24 HOURS) 14 mg/24hr patch Place 1 patch onto the skin daily. Patient not taking: Reported on 11/17/2022    [provider]     Vitals:   11/20/22 1101 11/20/22 1217 11/20/22 1540 11/20/22 1944  BP: (!) 126/55 (!) 176/79 (!) 158/71 (!) 172/78  Pulse: 74  81 81  Resp: 20  20   Temp: 97.8 F (36.6 C)  97.8 F (36.6 C) 98.4 F (36.9 C)  TempSrc: Oral   Oral Oral  SpO2: 99%  98% 99%  Weight:       Exam Gen alert, no distress, elderly lady  No rash, cyanosis or gangrene Sclera anicteric, throat clear  No jvd or bruits Chest clear bilat to bases, no rales/ wheezing RRR no MRG Abd soft ntnd no mass or ascites +bs GU defer MS no joint effusions or deformity Ext no LE or UE edema, no wounds or ulcers Neuro is alert, Ox 3 , nf    Home meds include - valsartan 80 bid, lasix 20 every day, mirabegron er, mvi, nebivolol 10 every day, omeprazole,  simvastatin, valtrex, micotine patch, prns      UA 9/5 -- ket 5k, large LE, 30 prot, rare bact, 0-5 rbc/epi, 6-10 wbc     UNa 54      UOsm 381, 456       Serum osm - 266 (275- 295) on 9/06      I/O since admit 2.8 L in and 1000 cc UOP = +1.8L      Renal US - atrophic R kidney 6.8cm, L kidney 10cm, no hydro        VS - B"s normal to high since admission        HR 80-90s , RR 14-23, afebrile since admit, 98% on RA       Current meds - po hydralazine 50 tid, mirabegron, nebivolol 10 every day, protonix, requip, zocor, valtrex, IV rocephin, prn IV hydralazine, albuterol, tylenol, imodium, mylicon     Assessment/ Plan: Hyponatremia - hypotonic euvolemic hyponatremia. UNa is normal (not low) and UOsm is high. This is an SIADH like situation. Pt had a case of shingles case last week, and there are a number of case reports of the infection causing hyponatremia/ SIADH. No sig CHF, liver or sig renal failure. Will check TSH and am cortisol. Will give tolvaptan x 1. After that will use salt tabs and fluid restriction and hopefully this would only be needed for the short-term. Would cont to hold lasix and ARB so as not to muddy the picture. Will follow.  UTI - on abx per pmd Recent shingles infection - mostly resolved per the daughter AKI - resolved, last creat today was 0.9 H/o atrophic R kidney - related to severe renal artery stenosis on that side RLS - per pmd      Vinson Moselle  MD [facility] 11/20/2022,  9:38 PM  Recent Labs  Lab 11/19/22 1007 11/20/22 1226  HGB 12.0 12.0  ALBUMIN 3.3* 3.2*  CALCIUM 8.8* 8.3*  CREATININE 1.07* 0.99  K 4.0 4.2   Inpatient medications:  heparin  5,000 Units Subcutaneous Q8H   hydrALAZINE  50 mg Oral Q8H   mirabegron ER  50 mg Oral Daily   multivitamin with minerals  1 tablet Oral Daily   nebivolol  10 mg Oral Daily   pantoprazole  40 mg Oral Daily   [START ON 11/21/2022] rOPINIRole  0.5 mg Oral QHS   simvastatin  20 mg Oral q1800   valACYclovir  1,000 mg Oral BID    cefTRIAXone (ROCEPHIN)  IV 1 g (11/20/22 2122)   acetaminophen, albuterol, hydrALAZINE, loperamide, ondansetron **OR** ondansetron (ZOFRAN) IV, mouth rinse, saline, simethicone, sodium chloride

## 2022-11-21 DIAGNOSIS — G9341 Metabolic encephalopathy: Secondary | ICD-10-CM | POA: Diagnosis not present

## 2022-11-21 DIAGNOSIS — E871 Hypo-osmolality and hyponatremia: Secondary | ICD-10-CM | POA: Diagnosis not present

## 2022-11-21 LAB — CBC
HCT: 35.2 % — ABNORMAL LOW (ref 36.0–46.0)
Hemoglobin: 11.9 g/dL — ABNORMAL LOW (ref 12.0–15.0)
MCH: 29.6 pg (ref 26.0–34.0)
MCHC: 33.8 g/dL (ref 30.0–36.0)
MCV: 87.6 fL (ref 80.0–100.0)
Platelets: 301 10*3/uL (ref 150–400)
RBC: 4.02 MIL/uL (ref 3.87–5.11)
RDW: 13.9 % (ref 11.5–15.5)
WBC: 7.5 10*3/uL (ref 4.0–10.5)
nRBC: 0 % (ref 0.0–0.2)

## 2022-11-21 LAB — COMPREHENSIVE METABOLIC PANEL
ALT: 36 U/L (ref 0–44)
AST: 34 U/L (ref 15–41)
Albumin: 3 g/dL — ABNORMAL LOW (ref 3.5–5.0)
Alkaline Phosphatase: 44 U/L (ref 38–126)
Anion gap: 10 (ref 5–15)
BUN: 12 mg/dL (ref 8–23)
CO2: 22 mmol/L (ref 22–32)
Calcium: 9 mg/dL (ref 8.9–10.3)
Chloride: 91 mmol/L — ABNORMAL LOW (ref 98–111)
Creatinine, Ser: 0.96 mg/dL (ref 0.44–1.00)
GFR, Estimated: 58 mL/min — ABNORMAL LOW (ref >60)
Glucose, Bld: 103 mg/dL — ABNORMAL HIGH (ref 70–99)
Potassium: 4.1 mmol/L (ref 3.5–5.1)
Sodium: 123 mmol/L — ABNORMAL LOW (ref 135–145)
Total Bilirubin: 0.3 mg/dL (ref 0.3–1.2)
Total Protein: 5.9 g/dL — ABNORMAL LOW (ref 6.5–8.1)

## 2022-11-21 LAB — SODIUM
Sodium: 126 mmol/L — ABNORMAL LOW (ref 135–145)
Sodium: 130 mmol/L — ABNORMAL LOW (ref 135–145)

## 2022-11-21 LAB — SODIUM, URINE, RANDOM: Sodium, Ur: 21 mmol/L

## 2022-11-21 LAB — OSMOLALITY, URINE: Osmolality, Ur: 90 mosm/kg — ABNORMAL LOW (ref 300–900)

## 2022-11-21 MED ORDER — SODIUM CHLORIDE 1 G PO TABS
1.0000 g | ORAL_TABLET | Freq: Three times a day (TID) | ORAL | Status: DC
  Administered 2022-11-21 – 2022-11-26 (×15): 1 g via ORAL
  Filled 2022-11-21 (×15): qty 1

## 2022-11-21 MED ORDER — FLUTICASONE PROPIONATE 50 MCG/ACT NA SUSP
1.0000 | Freq: Every day | NASAL | Status: DC
  Administered 2022-11-21 – 2022-11-25 (×5): 1 via NASAL
  Filled 2022-11-21: qty 16

## 2022-11-21 NOTE — Progress Notes (Signed)
Progress Note   Patient: Stacy Erickson [id number]:[id number] DOB: October 09, 1935 DOA: 11/17/2022     4 DOS: the patient was seen and examined on 11/21/2022   Brief hospital course: 87 y.o. female with medical history significant of HLD, HTN , PAD , GERD , Renal a stenosis  b/l with total occlusion on right, squamous cell cancer of skin of right shoulder.  Patient presents to ED brought in family with weakness and confusion in setting of recent diagnosis of ecoli UTI. Of note patient lives alone and is typically able to complete IDLS and ADLS and now is progressively weak and also has element of confusion from baseline in addition to having multiple falls at home. Patient as out patient was treated with oral antibiotics first keflex  and then cefdinir once cultures returned. Per chart patient's culture returned pan-sensitive Ecoli. Per patient and family despite antibiotic she has not made much improvement. Patient states on standing she feels all the energy drain from her body and her legs give out. She notes she has not had any LOC during these episodes. She notes no associated n/v/ abdominal pain/ chest pain [name] Maia Plan or chills. She has however noted increase urinary frequency as well as dysuria at the onset of  feeling ill.  She notes she continues to feel fatigued which is not usual for her. He also notes during this period she was compliant with all her medications.   Assessment and Plan: UTI with associated Acute Metabolic Encephalopathy after treatment with oral abx PTA - out patient culture pan-sensitive Ecoli per report - continued on empiric rocephin, plan to complete 5 days tx - recheck cbc and bmet in AM   AKI -in setting of uti, lasix /arb use -held nephrotoxic agents - was given IVF, now held per below - renal u/s performed, reviewed. Chronically atrophic R kidney, otherwise unremarkable -Cr has normalized  Acute Metabolic Encephalopathy  - multifactorial due to uti/ aki/ electrolyte  abnormality -ammonia normal - Mentation seems improved   Chronic Hyponatremia -baseline Na around 129/130  - Initially presumed hypovolemic hyponatremia in setting of lasix use/not adequate intake of fluids - held lasix and received IVF with initial improvement in Na -Na trended down on 9/6 with sodium studies repeated. Finding of lower serum osm of 266, urine osm 456, and urine Na 54 -Nephrology was consulted with concerns of worsening SIADH -Na now improving with fluid restriction, salt tabs, and tolvaptan -recheck bmet in AM   Hypertension - in setting of renal artery stenosis  - BP currently stable - continued on po hydralazine     HLD -cont statin     GERD -ppi     Renal a stenosis  b/l  -with total occlusion on right    Squamous cell cancer of skin of right shoulder -s/p treatment   Resless leg -now much improved with trial of requip      Subjective: Was tired this AM, but more alert when awake. Voiding extremely well  Physical Exam: Vitals:   11/21/22 0810 11/21/22 1133 11/21/22 1400 11/21/22 1547  BP:  139/62  134/78  Pulse:  74  85  Resp: 17 16  16   Temp:  (!) 97.5 F (36.4 C)  98.3 F (36.8 C)  TempSrc:  Oral    SpO2:  97%  99%  Weight:      Height:   5' (1.524 m)    General exam: Conversant, in no acute distress Respiratory system: normal chest rise, clear, no audible wheezing Cardiovascular  system: regular rhythm, s1-s2 Gastrointestinal system: Nondistended, nontender, pos BS Central nervous system: No seizures, no tremors Extremities: No cyanosis, no joint deformities Skin: No rashes, no pallor Psychiatry: Affect normal // no auditory hallucinations   Data Reviewed:  Labs reviewed: Na 126, K 4.1, Cr 0.96, WBC 7.5  Family Communication: Pt in room, family at bedside  Disposition: Status is: Inpatient Remains inpatient appropriate because: severity of illness  Planned Discharge Destination: Skilled nursing  facility    Author: Rickey Barbara, MD 11/21/2022 4:30 PM  For on call review [email or web address].

## 2022-11-21 NOTE — Plan of Care (Signed)
  Problem: Education: Goal: Knowledge of General Education information will improve Description: Including pain rating scale, medication(s)/side effects and non-pharmacologic comfort measures Outcome: Progressing   Problem: Pain Managment: Goal: General experience of comfort will improve Outcome: Progressing   Problem: Safety: Goal: Ability to remain free from injury will improve Outcome: Progressing   

## 2022-11-21 NOTE — Plan of Care (Signed)

## 2022-11-21 NOTE — Progress Notes (Addendum)
[facility] Progress Note  Subjective: Na+ 123 this am. Pt looks better per the family .   Vitals:   11/21/22 0547 11/21/22 0738 11/21/22 0810 11/21/22 1133  BP:  (!) 159/74  139/62  Pulse:  80  74  Resp:  16 17 16   Temp:  98.7 F (37.1 C)  (!) 97.5 F (36.4 C)  TempSrc:  Oral  Oral  SpO2:  96%  97%  Weight: 66.5 kg       Exam: Gen alert, elderly, no distress, pleasant  No jvd or bruits Chest clear bilat to bases RRR no MRG Abd soft ntnd no mass or ascites +bs Ext no LE edema  Neuro is alert, Ox 3 , nf      Home meds include - valsartan 80 bid, lasix 20 daily, mirabegron er, mvi, nebivolol 10 every day, omeprazole, simvastatin, valtrex, micotine patch, prns       UA 9/5 -- ket 5k, large LE, 30 prot, rare bact, 0-5 rbc/epi, 6-10 wbc     UNa 54      UOsm 381, 456       Serum osm - 266 (275- 295) on 9/06      I/O since admit 2.8 L in and 1000 cc UOP = +1.8L      Renal US - atrophic R kidney 6.8cm, L kidney 10cm, no hydro        VS - B"s normal to high since admission        HR 80-90s , RR 14-23, afebrile since admit, 98% on RA       Current meds - po hydralazine 50 tid, mirabegron, nebivolol 10 every day, protonix, requip, zocor, valtrex, IV rocephin, prn IV hydralazine, albuterol, tylenol, imodium, mylicon         Assessment/ Plan: Hyponatremia - hypotonic euvolemic hyponatremia. UNa is normal (not low) and UOsm is high. This is an SIADH like situation. Pt had a case of shingles last week, and there are a number of case reports of the infection causing hyponatremia/ SIADH (not sure whether due to the virus or the painful condition it causes). No sig CHF, liver or sig renal failure. TSH wnl, am cortisol pending. Gave her tolvaptan 15mg  x 1 late on 9/07 in late evening. Na up at 123 this am. Adding salt tabs and 1000 cc fluid restriction now. Repeat Na q 6 hrs today x 2 and in am tomorrow. Goal is Na+ > 128 or so. Hopefully this will be short-term.  HTN - would  cont to hold losartan/ lasix home meds. Getting home nebivolol and po hydralazine 50 tid added here.  UTI - on abx per pmd Recent shingles infection - mostly resolved  AKI - creat 1.5 on admission, resolved w/ IVF's. Creat yest was 0.9.  H/o atrophic R kidney - hx of severe renal artery stenosis on that side RLS - per pmd     Vinson Moselle MD  [facility] 11/21/2022, 12:47 PM  Recent Labs  Lab 11/20/22 1226 11/21/22 0720  HGB 12.0 11.9*  ALBUMIN 3.2* 3.0*  CALCIUM 8.3* 9.0  CREATININE 0.99 0.96  K 4.2 4.1   No results for input(s): "IRON", "TIBC", "FERRITIN" in the last 168 hours. Inpatient medications:  fluticasone  1 spray Each Nare Daily   heparin  5,000 Units Subcutaneous Q8H   hydrALAZINE  50 mg Oral Q8H   mirabegron ER  50 mg Oral Daily   multivitamin with minerals  1 tablet Oral Daily   nebivolol  10 mg Oral Daily   pantoprazole  40 mg Oral Daily   rOPINIRole  0.5 mg Oral QHS   simvastatin  20 mg Oral q1800   sodium chloride  1 g Oral TID WC   valACYclovir  1,000 mg Oral BID    cefTRIAXone (ROCEPHIN)  IV Stopped (11/20/22 2200)   acetaminophen, albuterol, hydrALAZINE, loperamide, ondansetron **OR** ondansetron (ZOFRAN) IV, mouth rinse, saline, simethicone, sodium chloride

## 2022-11-22 DIAGNOSIS — G9341 Metabolic encephalopathy: Secondary | ICD-10-CM | POA: Diagnosis not present

## 2022-11-22 DIAGNOSIS — E871 Hypo-osmolality and hyponatremia: Secondary | ICD-10-CM | POA: Diagnosis not present

## 2022-11-22 LAB — BASIC METABOLIC PANEL
Anion gap: 8 (ref 5–15)
BUN: 14 mg/dL (ref 8–23)
CO2: 21 mmol/L — ABNORMAL LOW (ref 22–32)
Calcium: 9 mg/dL (ref 8.9–10.3)
Chloride: 97 mmol/L — ABNORMAL LOW (ref 98–111)
Creatinine, Ser: 0.99 mg/dL (ref 0.44–1.00)
GFR, Estimated: 56 mL/min — ABNORMAL LOW (ref >60)
Glucose, Bld: 103 mg/dL — ABNORMAL HIGH (ref 70–99)
Potassium: 3.9 mmol/L (ref 3.5–5.1)
Sodium: 126 mmol/L — ABNORMAL LOW (ref 135–145)

## 2022-11-22 LAB — CORTISOL-AM, BLOOD: Cortisol - AM: 21.8 ug/dL (ref 6.7–22.6)

## 2022-11-22 LAB — OSMOLALITY, URINE: Osmolality, Ur: 327 mosm/kg (ref 300–900)

## 2022-11-22 LAB — SODIUM: Sodium: 124 mmol/L — ABNORMAL LOW (ref 135–145)

## 2022-11-22 MED ORDER — FUROSEMIDE 20 MG PO TABS: 20.0000 mg | ORAL_TABLET | Freq: Once | ORAL | Status: DC

## 2022-11-22 MED ORDER — FUROSEMIDE 20 MG PO TABS: 20.0000 mg | ORAL_TABLET | Freq: Two times a day (BID) | ORAL | Status: DC

## 2022-11-22 MED ORDER — GABAPENTIN 100 MG PO CAPS
200.0000 mg | ORAL_CAPSULE | Freq: Two times a day (BID) | ORAL | Status: DC
  Administered 2022-11-22 – 2022-11-26 (×10): 200 mg via ORAL
  Filled 2022-11-22 (×10): qty 2

## 2022-11-22 MED ORDER — HYDROMORPHONE HCL 1 MG/ML IJ SOLN
0.5000 mg | Freq: Once | INTRAMUSCULAR | Status: AC
  Administered 2022-11-22: 0.5 mg via INTRAVENOUS
  Filled 2022-11-22: qty 0.5

## 2022-11-22 MED ORDER — UREA 15 G PO PACK
15.0000 g | PACK | Freq: Two times a day (BID) | ORAL | Status: DC
  Administered 2022-11-22 – 2022-11-24 (×5): 15 g via ORAL
  Filled 2022-11-22 (×6): qty 1

## 2022-11-22 NOTE — Progress Notes (Signed)
Physical Therapy Treatment Patient Details Name: Stacy Erickson MRN: [id number] DOB: 1935-05-26 Today's Date: 11/22/2022   History of Present Illness 87 y.o. female admitted 9/4 with confusion and falls. Pt with UTI and encephalopathy. PMHx: HLD, HTN , PAD , GERD , Renal a stenosis, skin CA, THA    PT Comments  States she has had a rough weekend and hasn't gotten out of bed. Feels a bit better today and willing to try. Improved bed mobility at a CGA level, min assist for sit to stand and step pivot transfer. Performed short distance forward and backwards steps in room with RW for support, showing posterior LOB. Cues for technique, and safe AD use with RW. Reviewed LE exercises. Patient will continue to benefit from skilled physical therapy services to further improve independence with functional mobility.     If plan is discharge home, recommend the following: A little help with walking and/or transfers;Assistance with cooking/housework;Assist for transportation;Supervision due to cognitive status;Direct supervision/assist for medications management;Direct supervision/assist for financial management;A lot of help with bathing/dressing/bathroom   Can travel by private vehicle     Yes  Equipment Recommendations  BSC/3in1    Recommendations for Other Services       Precautions / Restrictions Precautions Precautions: Fall Precaution Comments: incontinent Restrictions Weight Bearing Restrictions: No     Mobility  Bed Mobility Overal bed mobility: Needs Assistance Bed Mobility: Supine to Sit     Supine to sit: Contact guard, HOB elevated     General bed mobility comments: CGA for trainsitions to EOB. HOB elevated, cues for technique. Requires extra time and VC.    Transfers Overall transfer level: Needs assistance Equipment used: Rolling walker (2 wheels) Transfers: Sit to/from Stand, Bed to chair/wheelchair/BSC Sit to Stand: Min assist   Step pivot transfers: Min assist        General transfer comment: Min assist for boost and balance. VC for hand placement, practiced several times from bed with RW for support - pt leans posteriorly. Min assist for RW control and balance with step pivot transfer to recliner, cues to reach back.    Ambulation/Gait Ambulation/Gait assistance: Min assist Gait Distance (Feet): 5 Feet Assistive device: Rolling walker (2 wheels) Gait Pattern/deviations: Step-through pattern, Trunk flexed, Shuffle, Leaning posteriorly Gait velocity: dec Gait velocity interpretation: <1.31 ft/sec, indicative of household ambulator   General Gait Details: Posterior instability, needs min assist for balance and to advance RW with heavy VC. Pt with high fear of falling, limiting further distance today. Cues for safety, DME use, and sequencing.   Stairs             Wheelchair Mobility     Tilt Bed    Modified Rankin (Stroke Patients Only)       Balance Overall balance assessment: Needs assistance, History of Falls Sitting-balance support: Feet supported, No upper extremity supported Sitting balance-Leahy Scale: Fair     Standing balance support: Reliant on assistive device for balance Standing balance-Leahy Scale: Poor Standing balance comment: UB support on RW and physical assist in standing with posterior bias                            Cognition Arousal: Alert Behavior During Therapy: WFL for tasks assessed/performed   Area of Impairment: Following commands, Safety/judgement, Problem solving                 Orientation Level:  (Oriented with extra time)     Following  Commands: Follows one step commands with increased time, Follows one step commands consistently Safety/Judgement: Decreased awareness of safety   Problem Solving: Slow processing, Requires verbal cues          Exercises General Exercises - Lower Extremity Ankle Circles/Pumps: AROM, Both, 10 reps, Seated Quad Sets: Strengthening, Both,  10 reps, Seated Gluteal Sets: Strengthening, Both, 10 reps, Seated Long Arc Quad: Strengthening, Both, 10 reps, Seated    General Comments General comments (skin integrity, edema, etc.): HR in 70s      Pertinent Vitals/Pain Pain Assessment Pain Assessment: No/denies pain    Home Living                          Prior Function            PT Goals (current goals can now be found in the care plan section) Acute Rehab PT Goals Patient Stated Goal: return home, walk safely PT Goal Formulation: With patient/family Time For Goal Achievement: 12/02/22 Potential to Achieve Goals: Fair Progress towards PT goals: Progressing toward goals    Frequency    Min 1X/week      PT Plan      Co-evaluation              AM-PAC PT "6 Clicks" Mobility   Outcome Measure  Help needed turning from your back to your side while in a flat bed without using bedrails?: A Little Help needed moving from lying on your back to sitting on the side of a flat bed without using bedrails?: A Little Help needed moving to and from a bed to a chair (including a wheelchair)?: A Little Help needed standing up from a chair using your arms (e.g., wheelchair or bedside chair)?: A Little Help needed to walk in hospital room?: A Lot Help needed climbing 3-5 steps with a railing? : A Lot 6 Click Score: 16    End of Session Equipment Utilized During Treatment: Gait belt Activity Tolerance: Patient tolerated treatment well Patient left: in chair;with call bell/phone within reach;with chair alarm set Nurse Communication: Mobility status PT Visit Diagnosis: Other abnormalities of gait and mobility (R26.89);History of falling (Z91.81);Unsteadiness on feet (R26.81)     Time: 4696-2952 PT Time Calculation (min) (ACUTE ONLY): 20 min  Charges:    $Therapeutic Activity: 8-22 mins PT General Charges $$ ACUTE PT VISIT: 1 Visit                     Kathlyn Sacramento, PT, DPT [facility]   Rehabilitation Services Physical Therapist Office: [phone] Website: [email or web address]    Berton Mount 11/22/2022, 12:17 PM

## 2022-11-22 NOTE — Progress Notes (Addendum)
[facility] Progress Note  Subjective: Na+ 130 yest PM, this AM 126.  She says she is feeling much better. Appetite is fair but hasn't had breafast yet.  I/Os 0.78 / 1.8L. wt down 3.3kg per chart.   Vitals:   11/21/22 2314 11/22/22 0315 11/22/22 0539 11/22/22 0735  BP: (!) 129/56 (!) 154/66  (!) 156/81  Pulse: 81 78  74  Resp:      Temp: 98.1 F (36.7 C) 97.8 F (36.6 C)  98.2 F (36.8 C)  TempSrc: Axillary Axillary  Oral  SpO2: 95% 95%  100%  Weight:   63.3 kg   Height:        Exam: Gen alert, elderly, no distress, pleasant  No jvd or bruits Chest clear bilat to bases RRR no MRG Abd soft ntnd no mass or ascites +bs  Ext no LE edema  Neuro is alert, Ox 3 , nf      Home meds include - valsartan 80 bid, lasix 20 daily, mirabegron er, mvi, nebivolol 10 every day, omeprazole, simvastatin, valtrex, micotine patch, prns       UA 9/5 -- ket 5k, large LE, 30 prot, rare bact, 0-5 rbc/epi, 6-10 wbc     UNa 54      UOsm 381, 456       Serum osm - 266 (275- 295) on 9/06      I/O since admit 2.8 L in and 1000 cc UOP = +1.8L      Renal US - atrophic R kidney 6.8cm, L kidney 10cm, no hydro        VS - B"s normal to high since admission        HR 80-90s , RR 14-23, afebrile since admit, 98% on RA       Current meds - po hydralazine 50 tid, mirabegron, nebivolol 10 every day, protonix, requip, zocor, valtrex, IV rocephin, prn IV hydralazine, albuterol, tylenol, imodium, mylicon         Assessment/ Plan: Hyponatremia - hypotonic euvolemic hyponatremia. UNa is normal (not low) and UOsm is high. This is an SIADH like situation. Pt had a case of shingles last week, and there are a number of case reports of the infection causing hyponatremia/ SIADH (not sure whether due to the virus or the painful condition it causes). No sig CHF, liver or sig renal failure. TSH wnl, am cortisol pending. Got her tolvaptan 15mg  x 1 late on 9/07 in late evening. 9/8 added salt tabs and 1000 cc  fluid restriction though interestingly urine osm were down to 90.  Serum sodium improved to 130 yest PM but 126 this AM.  Will cont same care for now, check urine osm again, recheck serum sodium at 1200 today with further recs pending next labs.  HTN - holding losartan/ lasix home meds. Getting home nebivolol and po hydralazine 50 tid added here.    UTI - on abx per pmd Recent shingles infection - mostly resolved  AKI - creat 1.5 on admission, resolved w/ IVF's. Creat yest was 0.9.  H/o atrophic R kidney - hx of severe renal artery stenosis on that side RLS - per pmd  D/w primary MD.   ADDENDUM: afternoon sodium repeat is 124, urine osm 327 - decently dilute.  Adding urea 15g BID 1st dose now. Next sodium in AM.  Estill Bakes MD [facility] Pager [phone]   Recent Labs  Lab 11/20/22 1226 11/21/22 0720  HGB 12.0 11.9*  ALBUMIN 3.2* 3.0*  CALCIUM 8.3* 9.0  CREATININE 0.99 0.96  K 4.2 4.1   No results for input(s): "IRON", "TIBC", "FERRITIN" in the last 168 hours. Inpatient medications:  fluticasone  1 spray Each Nare Daily   gabapentin  200 mg Oral BID   heparin  5,000 Units Subcutaneous Q8H   hydrALAZINE  50 mg Oral Q8H   mirabegron ER  50 mg Oral Daily   multivitamin with minerals  1 tablet Oral Daily   nebivolol  10 mg Oral Daily   pantoprazole  40 mg Oral Daily   rOPINIRole  0.5 mg Oral QHS   simvastatin  20 mg Oral q1800   sodium chloride  1 g Oral TID WC   valACYclovir  1,000 mg Oral BID     acetaminophen, albuterol, hydrALAZINE, loperamide, ondansetron **OR** ondansetron (ZOFRAN) IV, mouth rinse, saline, simethicone, sodium chloride

## 2022-11-22 NOTE — TOC Progression Note (Signed)
Transition of Care [facility]) - Progression Note    Patient Details  Name: Stacy Erickson MRN: [id number] Date of Birth: 1935/11/29  [facility] CM/SW Contact  Kermit Balo, RN Phone Number: 11/22/2022, 3:58 PM  Clinical Narrative:     Plan is d/c to [facility] SNF when her sodium is stable. Britney with [name] aware of no admission today. TOC following.  Expected Discharge Plan: Skilled Nursing Facility Barriers to Discharge: Continued Medical Work up  Expected Discharge Plan and Services   Discharge Planning Services: CM Consult   Living arrangements for the past 2 months: Single Family Home                                       Social Determinants of Health (SDOH) Interventions SDOH Screenings   Food Insecurity: No Food Insecurity (11/17/2022)  Housing: Low Risk  (11/17/2022)  Transportation Needs: No Transportation Needs (11/17/2022)  Utilities: Not At Risk (11/17/2022)  Depression (PHQ2-9): Low Risk  (09/01/2021)  Financial Resource Strain: Low Risk  (09/01/2021)  Social Connections: Moderately Integrated (09/01/2021)  Stress: No Stress Concern Present (09/01/2021)  Tobacco Use: Medium Risk (11/17/2022)    Readmission Risk Interventions     No data to display

## 2022-11-22 NOTE — Progress Notes (Signed)
Progress Note   Patient: Stacy Erickson PIR:[id number] DOB: June 05, 1935 DOA: 11/17/2022     5 DOS: the patient was seen and examined on 11/22/2022   Brief hospital course: 87 y.o. female with medical history significant of HLD, HTN , PAD , GERD , Renal a stenosis  b/l with total occlusion on right, squamous cell cancer of skin of right shoulder.  Patient presents to ED brought in family with weakness and confusion in setting of recent diagnosis of ecoli UTI. Of note patient lives alone and is typically able to complete IDLS and ADLS and now is progressively weak and also has element of confusion from baseline in addition to having multiple falls at home. Patient as out patient was treated with oral antibiotics first keflex  and then cefdinir once cultures returned. Per chart patient's culture returned pan-sensitive Ecoli. Per patient and family despite antibiotic she has not made much improvement. Patient states on standing she feels all the energy drain from her body and her legs give out. She notes she has not had any LOC during these episodes. She notes no associated n/v/ abdominal pain/ chest pain [name] Maia Plan or chills. She has however noted increase urinary frequency as well as dysuria at the onset of  feeling ill.  She notes she continues to feel fatigued which is not usual for her. He also notes during this period she was compliant with all her medications.   Assessment and Plan: UTI with associated Acute Metabolic Encephalopathy after treatment with oral abx PTA - out patient culture pan-sensitive Ecoli per report - completed empiric rocephin - recheck cbc and bmet in AM   AKI -in setting of uti, lasix /arb use -held nephrotoxic agents - was given IVF, now held per below - renal u/s performed, reviewed. Chronically atrophic R kidney, otherwise unremarkable -Cr has normalized  Acute Metabolic Encephalopathy  - multifactorial due to uti/ aki/ electrolyte abnormality -ammonia normal -  Mentation seems improved   Acute on Chronic Hyponatremia -baseline Na around 129/130  - Initially presumed hypovolemic hyponatremia in setting of lasix use/not adequate intake of fluids - held lasix and received IVF with initial improvement in Na -Na trended down on 9/6 with sodium studies repeated. Finding of lower serum osm of 266, urine osm 456, and urine Na 54 -Nephrology was consulted with concerns of worsening SIADH -Na initially improved to 130 with fluid restriction, salt tabs, and tolvapta, however Na trended down again to 124 this afternoon -Trial of urea per Nephrology   Hypertension - in setting of renal artery stenosis  - BP currently stable - continued on po hydralazine     HLD -cont statin     GERD -ppi     Renal a stenosis  b/l  -with total occlusion on right    Squamous cell cancer of skin of right shoulder -s/p treatment   Resless leg -now much improved with trial of requip      Subjective:More awake today, feeling better  Physical Exam: Vitals:   11/22/22 0315 11/22/22 0539 11/22/22 0735 11/22/22 1207  BP: (!) 154/66  (!) 156/81 139/66  Pulse: 78  74 69  Resp:    18  Temp: 97.8 F (36.6 C)  98.2 F (36.8 C) 97.7 F (36.5 C)  TempSrc: Axillary  Oral Oral  SpO2: 95%  100% 97%  Weight:  63.3 kg    Height:       General exam: Awake, laying in bed, in nad Respiratory system: Normal respiratory effort, no wheezing  Cardiovascular system: regular rate, s1, s2 Gastrointestinal system: Soft, nondistended, positive BS Central nervous system: CN2-12 grossly intact, strength intact Extremities: Perfused, no clubbing Skin: Normal skin turgor, no notable skin lesions seen Psychiatry: Mood normal // no visual hallucinations   Data Reviewed:  Labs reviewed: Na 124, K 3.9, Cr 0.99  Family Communication: Pt in room, family at bedside  Disposition: Status is: Inpatient Remains inpatient appropriate because: severity of illness  Planned Discharge  Destination: Skilled nursing facility    Author: Rickey Barbara, MD 11/22/2022 4:44 PM  For on call review [email or web address].

## 2022-11-23 DIAGNOSIS — E871 Hypo-osmolality and hyponatremia: Secondary | ICD-10-CM | POA: Diagnosis not present

## 2022-11-23 DIAGNOSIS — G9341 Metabolic encephalopathy: Secondary | ICD-10-CM | POA: Diagnosis not present

## 2022-11-23 LAB — BASIC METABOLIC PANEL
Anion gap: 8 (ref 5–15)
BUN: 33 mg/dL — ABNORMAL HIGH (ref 8–23)
CO2: 22 mmol/L (ref 22–32)
Calcium: 8.7 mg/dL — ABNORMAL LOW (ref 8.9–10.3)
Chloride: 95 mmol/L — ABNORMAL LOW (ref 98–111)
Creatinine, Ser: 1.14 mg/dL — ABNORMAL HIGH (ref 0.44–1.00)
GFR, Estimated: 47 mL/min — ABNORMAL LOW (ref >60)
Glucose, Bld: 92 mg/dL (ref 70–99)
Potassium: 3.8 mmol/L (ref 3.5–5.1)
Sodium: 125 mmol/L — ABNORMAL LOW (ref 135–145)

## 2022-11-23 LAB — SODIUM: Sodium: 125 mmol/L — ABNORMAL LOW (ref 135–145)

## 2022-11-23 MED ORDER — TOLVAPTAN 15 MG PO TABS
15.0000 mg | ORAL_TABLET | Freq: Once | ORAL | Status: AC
  Administered 2022-11-23: 15 mg via ORAL
  Filled 2022-11-23: qty 1

## 2022-11-23 NOTE — Progress Notes (Signed)
[facility] Progress Note  Subjective: Yest AM Na was 126 from 130 PM prior, afternoon 124 and urea 15g BID was added.  AM sodium 125, BUN 14 > 33 and Cr 0.99 > 1.14.  She feels fine this AM.  Appetite is good. No neuro symptoms.   I/Os 0.84 / 0.6L. No wt recorded this AM. RN verifies accurate I/Os yest.   Vitals:   11/22/22 1732 11/22/22 1938 11/22/22 2312 11/23/22 0428  BP: (!) 121/59 (!) 128/57 (!) 111/43 (!) 154/86  Pulse: 78 82 80 77  Resp:   16 18  Temp: 98 F (36.7 C) 97.9 F (36.6 C) 98.3 F (36.8 C) 98.1 F (36.7 C)  TempSrc: Oral Oral  Oral  SpO2: 99% 97% 94% 97%  Weight:      Height:        Exam: Gen alert, elderly, no distress, pleasant  No jvd or bruits Chest clear bilat to bases RRR no MRG Abd soft ntnd no mass or ascites +bs  Ext no LE edema  Neuro is alert, Ox 3 , nf      Home meds include - valsartan 80 bid, lasix 20 daily, mirabegron er, mvi, nebivolol 10 every day, omeprazole, simvastatin, valtrex, micotine patch, prns       UA 9/5 -- ket 5k, large LE, 30 prot, rare bact, 0-5 rbc/epi, 6-10 wbc     UNa 54      UOsm 381, 456       Serum osm - 266 (275- 295) on 9/06      I/O since admit 2.8 L in and 1000 cc UOP = +1.8L      Renal US - atrophic R kidney 6.8cm, L kidney 10cm, no hydro        VS - B"s normal to high since admission        HR 80-90s , RR 14-23, afebrile since admit, 98% on RA       Current meds - po hydralazine 50 tid, mirabegron, nebivolol 10 every day, protonix, requip, zocor, valtrex, IV rocephin, prn IV hydralazine, albuterol, tylenol, imodium, mylicon         Assessment/ Plan: Hyponatremia - hypotonic euvolemic hyponatremia. UNa is normal (not low) and UOsm is high. This is an SIADH like situation. Pt had a case of shingles last week, and there are a number of case reports of the infection causing hyponatremia/ SIADH (not sure whether due to the virus or the painful condition it causes). No sig CHF, liver or sig renal  failure. TSH wnl, am cortisol wnl. Took tolvaptan 15mg  x 1 late on 9/07 in late evening. 9/8 added salt tabs and 1000 cc fluid restriction though interestingly urine osm were down to 90.  Serum sodium trended back down yesterday and urea 15g BID was added 9/9 PM and has had only 1 dose - stay same plan today.   Serum sodium 125 this AM, will check again at 12:00 HTN - holding losartan/ lasix home meds. Getting home nebivolol and po hydralazine 50 tid added here.   BPs are stable UTI - on abx per pmd Recent shingles infection - mostly resolved  AKI - creat 1.5 on admission, resolved w/ IVF's. Creat yest was 0.9, 1.14 today.  H/o atrophic R kidney - hx of severe renal artery stenosis on that side RLS - per pmd  Estill Bakes MD [facility] Pager [phone]   Recent Labs  Lab 11/20/22 1226 11/21/22 0720 11/22/22 0809 11/23/22 4332  HGB 12.0 11.9*  --   --   ALBUMIN 3.2* 3.0*  --   --   CALCIUM 8.3* 9.0 9.0 8.7*  CREATININE 0.99 0.96 0.99 1.14*  K 4.2 4.1 3.9 3.8   No results for input(s): "IRON", "TIBC", "FERRITIN" in the last 168 hours. Inpatient medications:  fluticasone  1 spray Each Nare Daily   gabapentin  200 mg Oral BID   heparin  5,000 Units Subcutaneous Q8H   hydrALAZINE  50 mg Oral Q8H   mirabegron ER  50 mg Oral Daily   multivitamin with minerals  1 tablet Oral Daily   nebivolol  10 mg Oral Daily   pantoprazole  40 mg Oral Daily   rOPINIRole  0.5 mg Oral QHS   simvastatin  20 mg Oral q1800   sodium chloride  1 g Oral TID WC   urea  15 g Oral BID   valACYclovir  1,000 mg Oral BID     acetaminophen, albuterol, hydrALAZINE, loperamide, ondansetron **OR** ondansetron (ZOFRAN) IV, mouth rinse, saline, simethicone, sodium chloride

## 2022-11-23 NOTE — Progress Notes (Signed)
Progress Note   Patient: Stacy Erickson ZOX:[id number] DOB: 10-24-1935 DOA: 11/17/2022     6 DOS: the patient was seen and examined on 11/23/2022   Brief hospital course: 87 y.o. female with medical history significant of HLD, HTN , PAD , GERD , Renal a stenosis  b/l with total occlusion on right, squamous cell cancer of skin of right shoulder.  Patient presents to ED brought in family with weakness and confusion in setting of recent diagnosis of ecoli UTI. Of note patient lives alone and is typically able to complete IDLS and ADLS and now is progressively weak and also has element of confusion from baseline in addition to having multiple falls at home. Patient as out patient was treated with oral antibiotics first keflex  and then cefdinir once cultures returned. Per chart patient's culture returned pan-sensitive Ecoli. Per patient and family despite antibiotic she has not made much improvement. Patient states on standing she feels all the energy drain from her body and her legs give out. She notes she has not had any LOC during these episodes. She notes no associated n/v/ abdominal pain/ chest pain [name] Maia Plan or chills. She has however noted increase urinary frequency as well as dysuria at the onset of  feeling ill.  She notes she continues to feel fatigued which is not usual for her. He also notes during this period she was compliant with all her medications.   Assessment and Plan: UTI with associated Acute Metabolic Encephalopathy after treatment with oral abx PTA - outpt culture was pos for pan-sensitive Ecoli per report - completed empiric rocephin this visit - no leukocytosis   AKI -in setting of uti, lasix /arb use -held nephrotoxic agents - was given IVF, now held per below - renal u/s performed, reviewed. Chronically atrophic R kidney, otherwise unremarkable - Cr had improved, today 1.14 -Nephrology following  Acute Metabolic Encephalopathy  - multifactorial due to uti/ aki/ electrolyte  abnormality -ammonia normal - Mentation now seems much improved   Acute on Chronic Hyponatremia -baseline Na around 129/130  - Initially presumed hypovolemic hyponatremia in setting of lasix use/not adequate intake of fluids - held lasix and received IVF with initial improvement in Na -Na trended down on 9/6 with sodium studies repeated. Finding of lower serum osm of 266, urine osm 456, and urine Na 54 -Nephrology was consulted with concerns of worsening SIADH -Na initially improved, however remains labile and had trended down again -Now on urea per Nephrology   Hypertension - in setting of renal artery stenosis  - BP currently stable - continued on po hydralazine     HLD -cont statin     GERD -ppi     Renal a stenosis  b/l  -with total occlusion on right    Squamous cell cancer of skin of right shoulder -s/p treatment   Resless leg -now much improved with trial of requip      Subjective:Without complaints this AM  Physical Exam: Vitals:   11/22/22 1938 11/22/22 2312 11/23/22 0428 11/23/22 0738  BP: (!) 128/57 (!) 111/43 (!) 154/86 (!) 163/72  Pulse: 82 80 77 76  Resp:  16 18 18   Temp: 97.9 F (36.6 C) 98.3 F (36.8 C) 98.1 F (36.7 C) 97.7 F (36.5 C)  TempSrc: Oral  Oral Oral  SpO2: 97% 94% 97% 98%  Weight:      Height:       General exam: Conversant, in no acute distress Respiratory system: normal chest rise, clear, no audible  wheezing Cardiovascular system: regular rhythm, s1-s2 Gastrointestinal system: Nondistended, nontender, pos BS Central nervous system: No seizures, no tremors Extremities: No cyanosis, no joint deformities Skin: No rashes, no pallor Psychiatry: Affect normal // no auditory hallucinations   Data Reviewed:  Labs reviewed: Na 125, K 3.8, Cr 1.14  Family Communication: Pt in room, family at bedside  Disposition: Status is: Inpatient Remains inpatient appropriate because: severity of illness  Planned Discharge Destination:  Skilled nursing facility    Author: Rickey Barbara, MD 11/23/2022 2:02 PM  For on call review [email or web address].

## 2022-11-23 NOTE — Plan of Care (Signed)

## 2022-11-24 DIAGNOSIS — G9341 Metabolic encephalopathy: Secondary | ICD-10-CM | POA: Diagnosis not present

## 2022-11-24 LAB — RENAL FUNCTION PANEL
Albumin: 3.4 g/dL — ABNORMAL LOW (ref 3.5–5.0)
Anion gap: 11 (ref 5–15)
BUN: 57 mg/dL — ABNORMAL HIGH (ref 8–23)
CO2: 24 mmol/L (ref 22–32)
Calcium: 9.6 mg/dL (ref 8.9–10.3)
Chloride: 94 mmol/L — ABNORMAL LOW (ref 98–111)
Creatinine, Ser: 1.17 mg/dL — ABNORMAL HIGH (ref 0.44–1.00)
GFR, Estimated: 46 mL/min — ABNORMAL LOW (ref >60)
Glucose, Bld: 114 mg/dL — ABNORMAL HIGH (ref 70–99)
Phosphorus: 3.4 mg/dL (ref 2.5–4.6)
Potassium: 4 mmol/L (ref 3.5–5.1)
Sodium: 129 mmol/L — ABNORMAL LOW (ref 135–145)

## 2022-11-24 MED ORDER — AMLODIPINE BESYLATE 2.5 MG PO TABS
2.5000 mg | ORAL_TABLET | Freq: Every day | ORAL | Status: DC
  Administered 2022-11-24 – 2022-11-26 (×3): 2.5 mg via ORAL
  Filled 2022-11-24 (×3): qty 1

## 2022-11-24 NOTE — Progress Notes (Signed)
HOSPITALIST ROUNDING NOTE Stacy Erickson ZOX:[id number]  DOB: 23-Dec-1935  DOA: 11/17/2022  PCP: Rodrigo Ran, MD  11/24/2022,7:21 AM   LOS: 7 days      Code Status: Full code   From: Home  current Dispo: Unclear     87 year old white female-lives alone R renal stenosis with atrophic kidney HTN PAD Recent diagnosis shingles left upper arm on Valtrex--- subsequent diagnosis E. coli UTI complicated by weakness confusion--Rx Keflex then cefdinir Family brought patient to the ED 11/17/2022 2/2 generalized weakness, "legs giving out"-no LOC no and N/V ABD pain/CP/cough/fever + Dysuria + malaise  Sodium 124 creatinine 1.5 (baseline 1.0) Initially Lasix losartan held-presumed to have hypovolemic hyponatremia- 9/6 sodium trending downwards osmolality 266 urine osm 456 urine sodium 54 concerning for SIADH Nephrology consulted 9/7   Plan  Metabolic encephalopathy at admission secondary to both UTI and SIADH SIADH-precipitant nausea?  Recent shingles Improving, continue urea 15 g twice daily, salt tabs 1 g 3 times daily, fluid restrict to 1.0 L Labs a.m. and if reliably above 130 on several lab checks possibly can discharge?-Appreciate nephrology  Treated E. coli cystitis Ceftriaxone completed 11/21/2022  AKI on admission Improved with IVF which have been held Azotemia has worsened in the setting of fluid restriction--might be combination of tolvaptan versus urea Nephrology is aware  HTN Continue amlodipine 2.5 Bystolic 10 daily Zocor BP is not controlled may need to augment  Reflux  Restless leg syndrome Recent shingles Continue gabapentin 200 twice daily, Requip 0.5 at bedtime-restless leg is improved  Squamous cell CA right shoulder Outpatient follow-up  DVT prophylaxis: Heparin  Status is: Inpatient Remains inpatient appropriate because:   Requires improvement durably over the next several days    Subjective: Pleasant coherent can tell me date, president, joking laughing with  son Asking relevant questions No pain fever  Objective + exam Vitals:   11/23/22 1712 11/23/22 2006 11/24/22 0001 11/24/22 0433  BP: (!) 157/77 (!) 123/56 (!) 151/60 (!) 152/67  Pulse: 72   77  Resp:  17 15 13   Temp: 97.8 F (36.6 C) 98.6 F (37 C) 97.9 F (36.6 C) 97.8 F (36.6 C)  TempSrc: Oral Oral Oral Oral  SpO2: 97%   96%  Weight:      Height:       Filed Weights   11/20/22 0428 11/21/22 0547 11/22/22 0539  Weight: 66.6 kg 66.5 kg 63.3 kg    Examination:  Looks about stated age no icterus no pallor Neck soft supple Chest clear Abdomen soft S1-S2 no murmur although sinus arrhythmia No lower extremity edema Neuro intact  Data Reviewed: reviewed   CBC    Component Value Date/Time   WBC 7.5 11/21/2022 0720   RBC 4.02 11/21/2022 0720   HGB 11.9 (L) 11/21/2022 0720   HCT 35.2 (L) 11/21/2022 0720   PLT 301 11/21/2022 0720   MCV 87.6 11/21/2022 0720   MCH 29.6 11/21/2022 0720   MCHC 33.8 11/21/2022 0720   RDW 13.9 11/21/2022 0720   LYMPHSABS 0.3 (L) 02/18/2022 2035   MONOABS 1.1 (H) 02/18/2022 2035   EOSABS 0.0 02/18/2022 2035   BASOSABS 0.0 02/18/2022 2035      Latest Ref Rng & Units 11/23/2022   12:36 PM 11/23/2022    5:41 AM 11/22/2022    1:39 PM  CMP  Glucose 70 - 99 mg/dL  92    BUN 8 - 23 mg/dL  33    Creatinine 8.11 - 1.00 mg/dL  9.14    Sodium  135 - 145 mmol/L 125  125  124   Potassium 3.5 - 5.1 mmol/L  3.8    Chloride 98 - 111 mmol/L  95    CO2 22 - 32 mmol/L  22    Calcium 8.9 - 10.3 mg/dL  8.7       Scheduled Meds:  amLODipine  2.5 mg Oral Daily   fluticasone  1 spray Each Nare Daily   gabapentin  200 mg Oral BID   heparin  5,000 Units Subcutaneous Q8H   hydrALAZINE  50 mg Oral Q8H   mirabegron ER  50 mg Oral Daily   multivitamin with minerals  1 tablet Oral Daily   nebivolol  10 mg Oral Daily   pantoprazole  40 mg Oral Daily   rOPINIRole  0.5 mg Oral QHS   simvastatin  20 mg Oral q1800   sodium chloride  1 g Oral TID WC   urea   15 g Oral BID   valACYclovir  1,000 mg Oral BID   Continuous Infusions:  Time 50  Rhetta Mura, MD  [facility]

## 2022-11-24 NOTE — Progress Notes (Signed)
Occupational Therapy Treatment Patient Details Name: Stacy Erickson MRN: [id number] DOB: October 28, 1935 Today's Date: 11/24/2022   History of present illness 87 y.o. female admitted 9/4 with confusion and falls. Pt with UTI and encephalopathy. PMHx: HLD, HTN , PAD , GERD , Renal a stenosis, skin CA, THA   OT comments  Patient demonstrating good gains with OT treatment with CGA to get to EOB and min assist to ambulate to sink. Patient able to stand at sink to perform grooming tasks with one extremity assist and completed self care tasks seated due to fatigue. Patient requires frequent cues for safety with mobility and transfers. Patient will benefit from continued inpatient follow up therapy, <3 hours/day to address toilet transfers, LB bathing/dressing, and safety. Acute OT to continue to follow.       If plan is discharge home, recommend the following:  A lot of help with bathing/dressing/bathroom;Assistance with cooking/housework;Assist for transportation;A little help with walking and/or transfers   Equipment Recommendations  BSC/3in1    Recommendations for Other Services      Precautions / Restrictions Precautions Precautions: Fall Precaution Comments: incontinent Restrictions Weight Bearing Restrictions: No       Mobility Bed Mobility Overal bed mobility: Needs Assistance Bed Mobility: Supine to Sit     Supine to sit: Contact guard, HOB elevated, Used rails     General bed mobility comments: increased time and cues for rail use    Transfers Overall transfer level: Needs assistance Equipment used: Rolling walker (2 wheels) Transfers: Sit to/from Stand, Bed to chair/wheelchair/BSC Sit to Stand: Min assist     Step pivot transfers: Min assist     General transfer comment: min assist to power up and for transfers, cues to use RW and not furniture walk     Balance Overall balance assessment: Needs assistance, History of Falls Sitting-balance support: Feet supported, No  upper extremity supported Sitting balance-Leahy Scale: Fair Sitting balance - Comments: EOB   Standing balance support: Single extremity supported, Bilateral upper extremity supported, During functional activity Standing balance-Leahy Scale: Poor Standing balance comment: reliant on sink or RW for support, able to stand with one extremity support for grooming tasks                           ADL either performed or assessed with clinical judgement   ADL Overall ADL's : Needs assistance/impaired     Grooming: Wash/dry hands;Wash/dry face;Oral care;Brushing hair;Supervision/safety;Standing Grooming Details (indicate cue type and reason): performed oral care and hand/face hygiene standing, brushed hair in sitting Upper Body Bathing: Set up;Sitting   Lower Body Bathing: Moderate assistance;Sit to/from stand           Toilet Transfer: Minimal assistance;Rolling walker (2 wheels) Toilet Transfer Details (indicate cue type and reason): simulated                Extremity/Trunk Assessment              Vision       Perception     Praxis      Cognition Arousal: Alert Behavior During Therapy: WFL for tasks assessed/performed Overall Cognitive Status: No family/caregiver present to determine baseline cognitive functioning Area of Impairment: Following commands, Safety/judgement, Problem solving                     Memory: Decreased short-term memory Following Commands: Follows one step commands with increased time, Follows one step commands consistently Safety/Judgement: Decreased awareness of  safety   Problem Solving: Slow processing, Requires verbal cues General Comments: cues to use walker and not furniture walker, pleasant        Exercises      Shoulder Instructions       General Comments      Pertinent Vitals/ Pain       Pain Assessment Pain Assessment: No/denies pain  Home Living                                           Prior Functioning/Environment              Frequency  Min 1X/week        Progress Toward Goals  OT Goals(current goals can now be found in the care plan section)  Progress towards OT goals: Progressing toward goals  Acute Rehab OT Goals Patient Stated Goal: get better OT Goal Formulation: With patient Time For Goal Achievement: 12/02/22 Potential to Achieve Goals: Good  Plan      Co-evaluation                 AM-PAC OT "6 Clicks" Daily Activity     Outcome Measure   Help from another person eating meals?: None Help from another person taking care of personal grooming?: None Help from another person toileting, which includes using toliet, bedpan, or urinal?: A Lot Help from another person bathing (including washing, rinsing, drying)?: A Lot Help from another person to put on and taking off regular upper body clothing?: None Help from another person to put on and taking off regular lower body clothing?: A Lot 6 Click Score: 18    End of Session Equipment Utilized During Treatment: Gait belt;Rolling walker (2 wheels)  OT Visit Diagnosis: Unsteadiness on feet (R26.81);Muscle weakness (generalized) (M62.81);History of falling (Z91.81)   Activity Tolerance Patient tolerated treatment well   Patient Left in chair;with call bell/phone within reach;with chair alarm set   Nurse Communication Mobility status        Time: 1610-9604 OT Time Calculation (min): 31 min  Charges: OT General Charges $OT Visit: 1 Visit OT Treatments $Self Care/Home Management : 8-22 mins  Alfonse Flavors, OTA Acute Rehabilitation Services  Office [phone]   Dewain Penning 11/24/2022, 9:49 AM

## 2022-11-24 NOTE — Progress Notes (Addendum)
[facility] Progress Note  Subjective: issues with loose stools this AM.  Daughter bedside.  Overall she's doing much better than admission.  UOP normal.  No other new c/o.  AM labs have been delayed - appears due to phlebotomy staffing.  Reordering Na stat now.  I/Os 1.2 / 1.6  Vitals:   11/23/22 2006 11/24/22 0001 11/24/22 0433 11/24/22 0839  BP: (!) 123/56 (!) 151/60 (!) 152/67 (!) 174/79  Pulse:   77 70  Resp: 17 15 13 14   Temp: 98.6 F (37 C) 97.9 F (36.6 C) 97.8 F (36.6 C) 98 F (36.7 C)  TempSrc: Oral Oral Oral Oral  SpO2:   96% 96%  Weight:      Height:        Exam: Gen alert, elderly, no distress, pleasant  No jvd or bruits Chest clear bilat to bases RRR no MRG Abd soft ntnd no mass or ascites +bs  Ext no LE edema  Neuro is alert, Ox 3 , nf      Home meds include - valsartan 80 bid, lasix 20 daily, mirabegron er, mvi, nebivolol 10 every day, omeprazole, simvastatin, valtrex, micotine patch, prns       UA 9/5 -- ket 5k, large LE, 30 prot, rare bact, 0-5 rbc/epi, 6-10 wbc     UNa 54      UOsm 381, 456       Serum osm - 266 (275- 295) on 9/06      I/O since admit 2.8 L in and 1000 cc UOP = +1.8L      Renal US - atrophic R kidney 6.8cm, L kidney 10cm, no hydro        VS - B"s normal to high since admission        HR 80-90s , RR 14-23, afebrile since admit, 98% on RA       Current meds - po hydralazine 50 tid, mirabegron, nebivolol 10 every day, protonix, requip, zocor, valtrex, IV rocephin, prn IV hydralazine, albuterol, tylenol, imodium, mylicon         Assessment/ Plan: Hyponatremia - hypotonic euvolemic hyponatremia. UNa is normal (not low) and UOsm is high. This is an SIADH like situation. Pt had a case of shingles last week, and there are a number of case reports of the infection causing hyponatremia/ SIADH (not sure whether due to the virus or the painful condition it causes). No sig CHF, liver or sig renal failure. TSH wnl, am cortisol wnl.  Took tolvaptan 15mg  x 1 late on 9/07 in late evening. 9/8 added salt tabs and 1000 cc fluid restriction though interestingly urine osm were down to 90.  Serum sodium trended back down 9/9 and urea 15g BID was added 9/9 PM.  Yesterday sodium continued to trend down so added 1 dose tolvaptan given at 5pm.  Awaiting AM labs still - reodered STAT. HTN - holding losartan/ lasix home meds. Getting home nebivolol and po hydralazine 50 tid added here.   BPs are somewhat high this AM - will add amlodipine while losartan on hold.  UTI - s/p abx per pmd Recent shingles infection - mostly resolved  AKI - creat 1.5 on admission, resolved w/ IVF's. Cr has been in the 1-1.1 range recently, await today's labs. H/o atrophic R kidney - hx of severe renal artery stenosis on that side RLS - per pmd ADDENDUM:  labs finally returned - sodium improved to 129. BUN 57 but cr 1.17 - suspect ^BUN related to oral  urea - looking euvolemic this AM.  Cont other meds for now, no tolvaptan.  Next BMP in AM.   Estill Bakes MD [facility] Pager [phone]   Recent Labs  Lab 11/20/22 1226 11/21/22 0720 11/22/22 0809 11/23/22 0541  HGB 12.0 11.9*  --   --   ALBUMIN 3.2* 3.0*  --   --   CALCIUM 8.3* 9.0 9.0 8.7*  CREATININE 0.99 0.96 0.99 1.14*  K 4.2 4.1 3.9 3.8   No results for input(s): "IRON", "TIBC", "FERRITIN" in the last 168 hours. Inpatient medications:  fluticasone  1 spray Each Nare Daily   gabapentin  200 mg Oral BID   heparin  5,000 Units Subcutaneous Q8H   hydrALAZINE  50 mg Oral Q8H   mirabegron ER  50 mg Oral Daily   multivitamin with minerals  1 tablet Oral Daily   nebivolol  10 mg Oral Daily   pantoprazole  40 mg Oral Daily   rOPINIRole  0.5 mg Oral QHS   simvastatin  20 mg Oral q1800   sodium chloride  1 g Oral TID WC   urea  15 g Oral BID   valACYclovir  1,000 mg Oral BID     acetaminophen, albuterol, hydrALAZINE, loperamide, ondansetron **OR** ondansetron (ZOFRAN) IV, mouth  rinse, saline, simethicone, sodium chloride

## 2022-11-24 NOTE — Progress Notes (Signed)
Physical Therapy Treatment Patient Details Name: Stacy Erickson MRN: [id number] DOB: 07-Dec-1935 Today's Date: 11/24/2022   History of Present Illness 87 y.o. female admitted 9/4 with confusion and falls. Pt with UTI and encephalopathy. hyponatremia. PMHx: HLD, HTN , PAD , GERD , Renal a stenosis, skin CA, THA    PT Comments  Tolerated treatment well. Still with minor instability during transfers, leaning posteriorly. CGA with gait, reliant on RW in room. Limited by fatigue and bowel urgency today. Tolerated LE exercise review without issues. Patient will continue to benefit from skilled physical therapy services to further improve independence with functional mobility.     If plan is discharge home, recommend the following: A little help with walking and/or transfers;Assistance with cooking/housework;Assist for transportation;Supervision due to cognitive status;Direct supervision/assist for medications management;Direct supervision/assist for financial management;A lot of help with bathing/dressing/bathroom   Can travel by private vehicle     Yes  Equipment Recommendations  BSC/3in1    Recommendations for Other Services OT consult     Precautions / Restrictions Precautions Precautions: Fall Precaution Comments: incontinent Restrictions Weight Bearing Restrictions: No     Mobility  Bed Mobility               General bed mobility comments: in recliner    Transfers Overall transfer level: Needs assistance Equipment used: Rolling walker (2 wheels) Transfers: Sit to/from Stand Sit to Stand: Min assist           General transfer comment: Min assist for boost and balalnce, slight posterior lean but improved from last visit. Cues for technique and handplacement, poor recall.    Ambulation/Gait Ambulation/Gait assistance: Contact guard assist Gait Distance (Feet): 30 Feet Assistive device: Rolling walker (2 wheels) Gait Pattern/deviations: Step-through pattern, Trunk  flexed, Shuffle, Leaning posteriorly, Drifts right/left Gait velocity: decr Gait velocity interpretation: <1.31 ft/sec, indicative of household ambulator   General Gait Details: Minor instability with RW, educated on use, needs cues to keep hands on RW, reaches for furniture. Drifting, CGA for safety. No overt buckling during bout.   Stairs             Wheelchair Mobility     Tilt Bed    Modified Rankin (Stroke Patients Only)       Balance Overall balance assessment: Needs assistance, History of Falls Sitting-balance support: Feet supported, No upper extremity supported Sitting balance-Leahy Scale: Fair     Standing balance support: Single extremity supported, During functional activity Standing balance-Leahy Scale: Poor Standing balance comment: Holds RW with single UE                            Cognition Arousal: Alert Behavior During Therapy: WFL for tasks assessed/performed Overall Cognitive Status: Impaired/Different from baseline Area of Impairment: Following commands, Safety/judgement, Problem solving                       Following Commands: Follows one step commands with increased time, Follows one step commands consistently Safety/Judgement: Decreased awareness of safety   Problem Solving: Slow processing, Requires verbal cues          Exercises General Exercises - Lower Extremity Ankle Circles/Pumps: AROM, Both, 10 reps, Seated Quad Sets: Strengthening, Both, 10 reps, Seated Gluteal Sets: Strengthening, Both, 10 reps, Seated Long Arc Quad: Strengthening, Both, 10 reps, Seated Hip ABduction/ADduction: Strengthening, Both, 10 reps, Seated Straight Leg Raises: Strengthening, Both, 10 reps, Seated    General Comments  Pertinent Vitals/Pain Pain Assessment Pain Assessment: No/denies pain    Home Living                          Prior Function            PT Goals (current goals can now be found in the  care plan section) Acute Rehab PT Goals Patient Stated Goal: return home, walk safely PT Goal Formulation: With patient/family Time For Goal Achievement: 12/02/22 Potential to Achieve Goals: Fair Progress towards PT goals: Progressing toward goals    Frequency    Min 1X/week      PT Plan      Co-evaluation              AM-PAC PT "6 Clicks" Mobility   Outcome Measure  Help needed turning from your back to your side while in a flat bed without using bedrails?: A Little Help needed moving from lying on your back to sitting on the side of a flat bed without using bedrails?: A Little Help needed moving to and from a bed to a chair (including a wheelchair)?: A Little Help needed standing up from a chair using your arms (e.g., wheelchair or bedside chair)?: A Little Help needed to walk in hospital room?: A Little Help needed climbing 3-5 steps with a railing? : A Little 6 Click Score: 18    End of Session Equipment Utilized During Treatment: Gait belt Activity Tolerance: Patient tolerated treatment well Patient left: in chair;with call bell/phone within reach;with chair alarm set;with family/visitor present Nurse Communication: Mobility status PT Visit Diagnosis: Other abnormalities of gait and mobility (R26.89);History of falling (Z91.81);Unsteadiness on feet (R26.81)     Time: 1610-9604 PT Time Calculation (min) (ACUTE ONLY): 21 min  Charges:    $Gait Training: 8-22 mins PT General Charges $$ ACUTE PT VISIT: 1 Visit                     Kathlyn Sacramento, PT, DPT [facility]  Rehabilitation Services Physical Therapist Office: [phone] Website: [email or web address]    Berton Mount 11/24/2022, 1:22 PM

## 2022-11-25 DIAGNOSIS — G9341 Metabolic encephalopathy: Secondary | ICD-10-CM | POA: Diagnosis not present

## 2022-11-25 LAB — BASIC METABOLIC PANEL
Anion gap: 10 (ref 5–15)
BUN: 57 mg/dL — ABNORMAL HIGH (ref 8–23)
CO2: 24 mmol/L (ref 22–32)
Calcium: 9.2 mg/dL (ref 8.9–10.3)
Chloride: 93 mmol/L — ABNORMAL LOW (ref 98–111)
Creatinine, Ser: 1.2 mg/dL — ABNORMAL HIGH (ref 0.44–1.00)
GFR, Estimated: 44 mL/min — ABNORMAL LOW (ref >60)
Glucose, Bld: 97 mg/dL (ref 70–99)
Potassium: 3.8 mmol/L (ref 3.5–5.1)
Sodium: 127 mmol/L — ABNORMAL LOW (ref 135–145)

## 2022-11-25 MED ORDER — UREA 15 G PO PACK
30.0000 g | PACK | Freq: Two times a day (BID) | ORAL | Status: DC
  Administered 2022-11-25 – 2022-11-26 (×3): 30 g via ORAL
  Filled 2022-11-25 (×3): qty 2

## 2022-11-25 NOTE — Plan of Care (Signed)
  Problem: Education: Goal: Knowledge of General Education information will improve Description: Including pain rating scale, medication(s)/side effects and non-pharmacologic comfort measures 11/25/2022 0155 by Charmian Muff, RN Outcome: Progressing 11/25/2022 0154 by Charmian Muff, RN Outcome: Progressing   Problem: Health Behavior/Discharge Planning: Goal: Ability to manage health-related needs will improve 11/25/2022 0155 by Charmian Muff, RN Outcome: Progressing 11/25/2022 0154 by Charmian Muff, RN Outcome: Progressing   Problem: Clinical Measurements: Goal: Ability to maintain clinical measurements within normal limits will improve Outcome: Progressing

## 2022-11-25 NOTE — Plan of Care (Signed)
  Problem: Education: Goal: Knowledge of General Education information will improve Description Including pain rating scale, medication(s)/side effects and non-pharmacologic comfort measures Outcome: Progressing   Problem: Health Behavior/Discharge Planning: Goal: Ability to manage health-related needs will improve Outcome: Progressing   

## 2022-11-25 NOTE — Progress Notes (Signed)
HOSPITALIST ROUNDING NOTE Stacy Erickson IEP:[id number]  DOB: [birth year]  DOA: 11/17/2022  PCP: Rodrigo Ran, MD  11/25/2022,10:39 AM   LOS: 8 days      Code Status: Full code   From: Home  current Dispo: Unclear     87 year old white female-lives alone R renal stenosis with atrophic kidney HTN PAD  Recent diagnosis shingles left upper arm on Valtrex--- subsequent diagnosis E. coli UTI complicated by weakness confusion--Rx Keflex then cefdinir Family brought patient to the ED 11/17/2022 2/2 generalized weakness, "legs giving out"-no LOC no and N/V ABD pain/CP/cough/fever + Dysuria + malaise  Sodium 124 creatinine 1.5 (baseline 1.0) Initially Lasix losartan held-presumed to have hypovolemic hyponatremia- 9/6 sodium trending downwards osmolality 266 urine osm 456 urine sodium 54 concerning for SIADH Nephrology consulted 9/7   Plan  Metabolic encephalopathy at admission secondary to both UTI and SIADH SIADH-precipitant nausea?  Recent shingles Progress with hyponatremia likely stalled 2/2 volume indiscretion?--goal fluids less than 1200, reinforced with son at the bedside Urea increased to 30 gm bid, salt tabs 1 g tid Aqaurese lasix as last resort?   Treated E. coli cystitis Ceftriaxone completed 11/21/2022  AKI on admission Improved with IVF which have been held Monitor--elevated bun form Urea tabs  HTN Continues amlodipine 2.5 Bystolic 10 daily Zocor Losartan from PTA held--so now on hydralazine 50 tid--not good long term strategy [any tid med]  Reflux  Restless leg syndrome Recent shingles Continue gabapentin 200 twice daily, Requip 0.5 at bedtime-restless leg is improved  Squamous cell CA right shoulder Outpatient follow-up  DVT prophylaxis: Heparin  Status is: Inpatient Remains inpatient appropriate because:   Requires improvement durably over the next several days    Subjective:  Coherent in good spirits Son bedside No pain fever She doesn't wish to get oob, I have  told her that pure-wick is not a good option and to rely on the depends as at SNF she needs to maximize mobility   Objective + exam Vitals:   11/24/22 2050 11/24/22 2302 11/25/22 0348 11/25/22 0756  BP: (!) 163/60 (!) 144/52 (!) 165/69 (!) 178/72  Pulse: 71 75 77 74  Resp: 18 18 16    Temp: 98.2 F (36.8 C) 98.4 F (36.9 C) 98.4 F (36.9 C) 97.6 F (36.4 C)  TempSrc: Oral Oral Oral Oral  SpO2: 98% 100% 98% 95%  Weight:      Height:       Filed Weights   11/20/22 0428 11/21/22 0547 11/22/22 0539  Weight: 66.6 kg 66.5 kg 63.3 kg    Examination:  Looks about stated age  Neck soft supple no mass no thyromegally Chest clear Abdomen soft S1-S2 n? HSM--telemetry No lower extremity edema Neuro intact  Data Reviewed: reviewed   CBC    Component Value Date/Time   WBC 7.5 11/21/2022 0720   RBC 4.02 11/21/2022 0720   HGB 11.9 (L) 11/21/2022 0720   HCT 35.2 (L) 11/21/2022 0720   PLT 301 11/21/2022 0720   MCV 87.6 11/21/2022 0720   MCH 29.6 11/21/2022 0720   MCHC 33.8 11/21/2022 0720   RDW 13.9 11/21/2022 0720   LYMPHSABS 0.3 (L) 02/18/2022 2035   MONOABS 1.1 (H) 02/18/2022 2035   EOSABS 0.0 02/18/2022 2035   BASOSABS 0.0 02/18/2022 2035      Latest Ref Rng & Units 11/25/2022    6:51 AM 11/24/2022   10:31 AM 11/23/2022   12:36 PM  CMP  Glucose 70 - 99 mg/dL 97  660    BUN  8 - 23 mg/dL 57  57    Creatinine 1.61 - 1.00 mg/dL 0.96  0.45    Sodium 409 - 145 mmol/L 127  129  125   Potassium 3.5 - 5.1 mmol/L 3.8  4.0    Chloride 98 - 111 mmol/L 93  94    CO2 22 - 32 mmol/L 24  24    Calcium 8.9 - 10.3 mg/dL 9.2  9.6       Scheduled Meds:  amLODipine  2.5 mg Oral Daily   fluticasone  1 spray Each Nare Daily   gabapentin  200 mg Oral BID   heparin  5,000 Units Subcutaneous Q8H   hydrALAZINE  50 mg Oral Q8H   mirabegron ER  50 mg Oral Daily   multivitamin with minerals  1 tablet Oral Daily   nebivolol  10 mg Oral Daily   pantoprazole  40 mg Oral Daily   rOPINIRole   0.5 mg Oral QHS   simvastatin  20 mg Oral q1800   sodium chloride  1 g Oral TID WC   urea  30 g Oral BID   valACYclovir  1,000 mg Oral BID   Continuous Infusions:  Time 20  Rhetta Mura, MD  [facility]

## 2022-11-25 NOTE — Progress Notes (Signed)
[facility] Progress Note  Subjective: Feels well this AM.  I/Os 335mL/400 UOP --> son says drank much more than that and > fluid restriction; that's been very challenging to observe  Vitals:   11/24/22 1723 11/24/22 2050 11/24/22 2302 11/25/22 0348  BP: (!) 141/67 (!) 163/60 (!) 144/52 (!) 165/69  Pulse: 72 71 75 77  Resp: 18 18 18 16   Temp: 97.7 F (36.5 C) 98.2 F (36.8 C) 98.4 F (36.9 C) 98.4 F (36.9 C)  TempSrc: Oral Oral Oral Oral  SpO2: 98% 98% 100% 98%  Weight:      Height:        Exam: Gen alert, elderly, no distress, pleasant  No jvd or bruits Chest clear bilat to bases RRR no MRG Abd soft ntnd no mass or ascites +bs  Ext no LE edema  Neuro is alert, Ox 3 , nf      Home meds include - valsartan 80 bid, lasix 20 daily, mirabegron er, mvi, nebivolol 10 every day, omeprazole, simvastatin, valtrex, micotine patch, prns       UA 9/5 -- ket 5k, large LE, 30 prot, rare bact, 0-5 rbc/epi, 6-10 wbc     UNa 54      UOsm 381, 456       Serum osm - 266 (275- 295) on 9/06      I/O since admit 2.8 L in and 1000 cc UOP = +1.8L      Renal US - atrophic R kidney 6.8cm, L kidney 10cm, no hydro        VS - B"s normal to high since admission        HR 80-90s , RR 14-23, afebrile since admit, 98% on RA       Current meds - po hydralazine 50 tid, mirabegron, nebivolol 10 every day, protonix, requip, zocor, valtrex, IV rocephin, prn IV hydralazine, albuterol, tylenol, imodium, mylicon         Assessment/ Plan: Hyponatremia - hypotonic euvolemic hyponatremia. UNa is normal (not low) and UOsm is high. This is an SIADH like situation. Pt had a case of shingles last week, and there are a number of case reports of the infection causing hyponatremia/ SIADH (not sure whether due to the virus or the painful condition it causes). No sig CHF, liver or sig renal failure. TSH wnl, am cortisol wnl. Took tolvaptan 15mg  x 1 late on 9/07 in late evening. 9/8 added salt tabs  and 1000 cc fluid restriction though interestingly urine osm were down to 90.  Serum sodium trended back down 9/9 and urea 15g BID was added 9/9 PM.  Further down, 2nd dose of tolvaptan 9/10. Improved to 129 yesterday but 127 today.  Increase urea to 30 BID.  Fluid restriction to which may be more feasible long term.   HTN - holding losartan/ lasix home meds. Getting home nebivolol and po hydralazine 50 tid added here.   BPs are somewhat high this AM - will add amlodipine while losartan on hold.  UTI - s/p abx per pmd Recent shingles infection - mostly resolved  AKI - creat 1.5 on admission, resolved w/ IVF's. Cr has been in the 1-1.2.  BUN 50s - suspect due to oral urea supplementation not hypovolemia.  H/o atrophic R kidney - hx of severe renal artery stenosis on that side RLS - per pmd  Will continue to follow, reach out with concerns.   Estill Bakes MD [facility] Pager [phone]  Recent Labs  Lab 11/20/22 1226 11/21/22 0720 11/22/22 0809 11/23/22 0541 11/24/22 1031  HGB 12.0 11.9*  --   --   --   ALBUMIN 3.2* 3.0*  --   --  3.4*  CALCIUM 8.3* 9.0   < > 8.7* 9.6  PHOS  --   --   --   --  3.4  CREATININE 0.99 0.96   < > 1.14* 1.17*  K 4.2 4.1   < > 3.8 4.0   < > = values in this interval not displayed.   No results for input(s): "IRON", "TIBC", "FERRITIN" in the last 168 hours. Inpatient medications:  amLODipine  2.5 mg Oral Daily   fluticasone  1 spray Each Nare Daily   gabapentin  200 mg Oral BID   heparin  5,000 Units Subcutaneous Q8H   hydrALAZINE  50 mg Oral Q8H   mirabegron ER  50 mg Oral Daily   multivitamin with minerals  1 tablet Oral Daily   nebivolol  10 mg Oral Daily   pantoprazole  40 mg Oral Daily   rOPINIRole  0.5 mg Oral QHS   simvastatin  20 mg Oral q1800   sodium chloride  1 g Oral TID WC   urea  15 g Oral BID   valACYclovir  1,000 mg Oral BID     acetaminophen, albuterol, hydrALAZINE, loperamide, ondansetron **OR**  ondansetron (ZOFRAN) IV, mouth rinse, saline, simethicone, sodium chloride

## 2022-11-25 NOTE — TOC Progression Note (Signed)
Transition of Care [facility]) - Progression Note    Patient Details  Name: Stacy Erickson MRN: [id number] Date of Birth: 1935-05-30  Transition of Care [facility]) CM/SW Contact  Kermit Balo, RN Phone Number: 11/25/2022, 11:02 AM  Clinical Narrative:    Sodium remains low. Britney with [name] updated that she is not medically ready.  TOC following.   Expected Discharge Plan: Skilled Nursing Facility Barriers to Discharge: Continued Medical Work up  Expected Discharge Plan and Services   Discharge Planning Services: CM Consult   Living arrangements for the past 2 months: Single Family Home                                       Social Determinants of Health (SDOH) Interventions SDOH Screenings   Food Insecurity: No Food Insecurity (11/17/2022)  Housing: Low Risk  (11/17/2022)  Transportation Needs: No Transportation Needs (11/17/2022)  Utilities: Not At Risk (11/17/2022)  Depression (PHQ2-9): Low Risk  (09/01/2021)  Financial Resource Strain: Low Risk  (09/01/2021)  Social Connections: Moderately Integrated (09/01/2021)  Stress: No Stress Concern Present (09/01/2021)  Tobacco Use: Medium Risk (11/17/2022)    Readmission Risk Interventions     No data to display

## 2022-11-26 DIAGNOSIS — I739 Peripheral vascular disease, unspecified: Secondary | ICD-10-CM | POA: Diagnosis not present

## 2022-11-26 DIAGNOSIS — I129 Hypertensive chronic kidney disease with stage 1 through stage 4 chronic kidney disease, or unspecified chronic kidney disease: Secondary | ICD-10-CM | POA: Diagnosis not present

## 2022-11-26 DIAGNOSIS — K2100 Gastro-esophageal reflux disease with esophagitis, without bleeding: Secondary | ICD-10-CM | POA: Diagnosis not present

## 2022-11-26 DIAGNOSIS — I700 Atherosclerosis of aorta: Secondary | ICD-10-CM | POA: Diagnosis not present

## 2022-11-26 DIAGNOSIS — G9341 Metabolic encephalopathy: Secondary | ICD-10-CM | POA: Diagnosis not present

## 2022-11-26 DIAGNOSIS — B029 Zoster without complications: Secondary | ICD-10-CM | POA: Diagnosis not present

## 2022-11-26 DIAGNOSIS — I701 Atherosclerosis of renal artery: Secondary | ICD-10-CM | POA: Diagnosis not present

## 2022-11-26 DIAGNOSIS — R2689 Other abnormalities of gait and mobility: Secondary | ICD-10-CM | POA: Diagnosis not present

## 2022-11-26 DIAGNOSIS — I10 Essential (primary) hypertension: Secondary | ICD-10-CM | POA: Diagnosis not present

## 2022-11-26 DIAGNOSIS — E871 Hypo-osmolality and hyponatremia: Secondary | ICD-10-CM | POA: Diagnosis not present

## 2022-11-26 DIAGNOSIS — Z8639 Personal history of other endocrine, nutritional and metabolic disease: Secondary | ICD-10-CM | POA: Diagnosis not present

## 2022-11-26 DIAGNOSIS — R2681 Unsteadiness on feet: Secondary | ICD-10-CM | POA: Diagnosis not present

## 2022-11-26 DIAGNOSIS — N179 Acute kidney failure, unspecified: Secondary | ICD-10-CM | POA: Diagnosis not present

## 2022-11-26 DIAGNOSIS — N261 Atrophy of kidney (terminal): Secondary | ICD-10-CM | POA: Diagnosis not present

## 2022-11-26 DIAGNOSIS — R41841 Cognitive communication deficit: Secondary | ICD-10-CM | POA: Diagnosis not present

## 2022-11-26 DIAGNOSIS — R531 Weakness: Secondary | ICD-10-CM | POA: Diagnosis not present

## 2022-11-26 DIAGNOSIS — G5692 Unspecified mononeuropathy of left upper limb: Secondary | ICD-10-CM | POA: Diagnosis not present

## 2022-11-26 DIAGNOSIS — Z23 Encounter for immunization: Secondary | ICD-10-CM | POA: Diagnosis not present

## 2022-11-26 DIAGNOSIS — N390 Urinary tract infection, site not specified: Secondary | ICD-10-CM | POA: Diagnosis not present

## 2022-11-26 DIAGNOSIS — D649 Anemia, unspecified: Secondary | ICD-10-CM | POA: Diagnosis not present

## 2022-11-26 DIAGNOSIS — M6281 Muscle weakness (generalized): Secondary | ICD-10-CM | POA: Diagnosis not present

## 2022-11-26 DIAGNOSIS — E538 Deficiency of other specified B group vitamins: Secondary | ICD-10-CM | POA: Diagnosis not present

## 2022-11-26 DIAGNOSIS — R4182 Altered mental status, unspecified: Secondary | ICD-10-CM | POA: Diagnosis not present

## 2022-11-26 DIAGNOSIS — E222 Syndrome of inappropriate secretion of antidiuretic hormone: Secondary | ICD-10-CM | POA: Diagnosis not present

## 2022-11-26 DIAGNOSIS — N1831 Chronic kidney disease, stage 3a: Secondary | ICD-10-CM | POA: Diagnosis not present

## 2022-11-26 LAB — BASIC METABOLIC PANEL
Anion gap: 10 (ref 5–15)
BUN: 79 mg/dL — ABNORMAL HIGH (ref 8–23)
CO2: 25 mmol/L (ref 22–32)
Calcium: 9.6 mg/dL (ref 8.9–10.3)
Chloride: 93 mmol/L — ABNORMAL LOW (ref 98–111)
Creatinine, Ser: 1.15 mg/dL — ABNORMAL HIGH (ref 0.44–1.00)
GFR, Estimated: 47 mL/min — ABNORMAL LOW (ref >60)
Glucose, Bld: 108 mg/dL — ABNORMAL HIGH (ref 70–99)
Potassium: 3.9 mmol/L (ref 3.5–5.1)
Sodium: 128 mmol/L — ABNORMAL LOW (ref 135–145)

## 2022-11-26 MED ORDER — SODIUM CHLORIDE 1 G PO TABS: 1.0000 g | ORAL_TABLET | Freq: Two times a day (BID) | ORAL | Status: DC

## 2022-11-26 MED ORDER — AMLODIPINE BESYLATE 2.5 MG PO TABS: 2.5000 mg | ORAL_TABLET | Freq: Every day | ORAL | Status: DC

## 2022-11-26 MED ORDER — GABAPENTIN 100 MG PO CAPS: 200.0000 mg | ORAL_CAPSULE | Freq: Every day | ORAL | 0 refills | Status: AC

## 2022-11-26 MED ORDER — ROPINIROLE HCL 0.5 MG PO TABS: 0.5000 mg | ORAL_TABLET | Freq: Every day | ORAL | 0 refills | Status: AC

## 2022-11-26 MED ORDER — AYR SALINE NASAL NA GEL: 1.0000 | NASAL | Status: AC | PRN

## 2022-11-26 MED ORDER — FLUTICASONE PROPIONATE 50 MCG/ACT NA SUSP: 1.0000 | Freq: Every day | NASAL | Status: AC

## 2022-11-26 MED ORDER — HYDRALAZINE HCL 50 MG PO TABS: 50.0000 mg | ORAL_TABLET | Freq: Three times a day (TID) | ORAL | Status: AC

## 2022-11-26 NOTE — Discharge Summary (Signed)
Physician Discharge Summary  Stacy Erickson VHQ:[id number] DOB: 05-15-1935 DOA: 11/17/2022  PCP: Rodrigo Ran, MD  Admit date: 11/17/2022 Discharge date: 11/26/2022  Time spent: 40 minutes  Recommendations for Outpatient Follow-up:  Requires 1200 cc fluid restriction salt tablets 1 g twice daily for 7 to 10 days and have labs done in about 3 days to ensure that the sodium is improved and maintaining We have stopped the Valtrex for shingles as patient has been treated for about 8 days for this and she can continue low-dose gabapentin 200 daily for 7 days and then stopped Follow-up as an outpatient  Discharge Diagnoses:  MAIN problem for hospitalization   Toxic metabolic encephalopathy from UTI and shingles SIADH with baseline sodium around 128 Recent UTI E. coli cystitis Right renal stenosis atrophic kidney and PAD  Please see below for itemized issues addressed in HOpsital- refer to other progress notes for clarity if needed  Discharge Condition: Improved  Diet recommendation: Regular salt in diet  Filed Weights   11/20/22 0428 11/21/22 0547 11/22/22 0539  Weight: 66.6 kg 66.5 kg 63.3 kg    History of present illness:  87 year old white female-lives alone R renal stenosis with atrophic kidney HTN PAD   Recent diagnosis shingles left upper arm on Valtrex--- subsequent diagnosis E. coli UTI complicated by weakness confusion--Rx Keflex then cefdinir  Family brought patient to the ED 11/17/2022 2/2 generalized weakness, "legs giving out"-no LOC no and N/V ABD pain/CP/cough/fever + Dysuria + malaise   Sodium 124 creatinine 1.5 (baseline 1.0) Initially Lasix losartan held-presumed to have hypovolemic hyponatremia- 9/6 sodium trending downwards osmolality 266 urine osm 456 urine sodium 54 concerning for SIADH Nephrology consulted 9/7  Hospital Course:  Metabolic encephalopathy at admission secondary to both UTI and SIADH This is completely resolved patient is much better overall and  is back at baseline  SIADH-precipitant nausea?  Recent shingles Several case reports of SIADH with shingles workup this hospital stay did not reveal CHF renal liver thyroid or cortisol abnormalities-initially Rx tolvaptan on 9/7, 9/10 finally fluid restriction salt tablets and urea tablets increased her sodium Nephrologist recommended salt tabs 1 g twice daily with 1200 cc fluid restriction Needs labs in about 3 to 4 days and possible outpatient referral to nephrology if continues   Treated E. coli cystitis Ceftriaxone completed 11/21/2022   AKI on admission-superimposed on CKD 3B in the setting of solitary kidney Improved with IVF which have been held Patient had some spuriously elevated urea because of the repeat urea tabs I did not feel she was intravascularly depleted and patient can get labs as an outpatient   HTN Continues amlodipine 2.5 Bystolic 10 daily Zocor Losartan from PTA held--so now on hydralazine 50 tid--not good long term strategy [any tid med]   Reflux   Restless leg syndrome Recent shingles Requip 0.5 start at this hospital stay, gabapentin continued but cut back Discharged to a 7-day final course of 200 at bedtime and then stop   Squamous cell CA right shoulder Outpatient follow-up   Discharge Exam: Vitals:   11/26/22 0328 11/26/22 0740  BP: (!) 158/64 132/63  Pulse: 74 72  Resp: 16 17  Temp: 97.7 F (36.5 C) 97.8 F (36.6 C)  SpO2: 98% 98%    Subj on day of d/c   Awake coherent no distress looks well feels well Back at normal mentation  General Exam on discharge  EOMI NCAT no focal deficit no icterus no pallor no wheeze no rales no rhonchi S1-S2 no murmur  Abdomen soft no rebound no guarding ROM intact   Discharge Instructions   Discharge Instructions     Diet - low sodium heart healthy   Complete by: As directed    Increase activity slowly   Complete by: As directed       Allergies as of 11/26/2022       Reactions   Codeine Nausea  Only   Sulfa Antibiotics Itching, Nausea Only        Medication List     STOP taking these medications    cefdinir 300 MG capsule Commonly known as: OMNICEF   cephALEXin 500 MG capsule Commonly known as: KEFLEX   furosemide 40 MG tablet Commonly known as: LASIX   IRON PO   loratadine 10 MG tablet Commonly known as: CLARITIN   triamcinolone cream 0.1 % Commonly known as: KENALOG   valACYclovir 1000 MG tablet Commonly known as: VALTREX   valsartan 80 MG tablet Commonly known as: DIOVAN       TAKE these medications    acetaminophen 500 MG tablet Commonly known as: TYLENOL Take 1,000 mg by mouth every 6 (six) hours as needed for mild pain.   amLODipine 2.5 MG tablet Commonly known as: NORVASC Take 1 tablet (2.5 mg total) by mouth daily. Start taking on: November 27, 2022   ASPERCREME MAX ROLL-ON EX Apply 1 application topically every 6 (six) hours as needed (pain).   B-12 Compliance Injection 1000 MCG/ML Kit Generic drug: Cyanocobalamin 1 IM 1x a month Injection   fluticasone 50 MCG/ACT nasal spray Commonly known as: FLONASE Place 1 spray into both nostrils daily. Start taking on: November 27, 2022   gabapentin 100 MG capsule Commonly known as: NEURONTIN Take 2 capsules (200 mg total) by mouth daily for 7 days.   hydrALAZINE 50 MG tablet Commonly known as: APRESOLINE Take 1 tablet (50 mg total) by mouth every 8 (eight) hours.   multivitamin capsule Take 1 capsule by mouth daily.   Myrbetriq 50 MG Tb24 tablet Generic drug: mirabegron ER Take 50 mg by mouth daily.   nebivolol 10 MG tablet Commonly known as: BYSTOLIC Take 10 mg by mouth daily. Take at bedtime   nicotine 14 mg/24hr patch Commonly known as: NICODERM CQ - dosed in mg/24 hours Place 1 patch onto the skin daily.   omeprazole 20 MG capsule Commonly known as: PRILOSEC 1 capsule 30 minutes before morning meal Orally Once a day for 90 days   rOPINIRole 0.5 MG tablet Commonly  known as: REQUIP Take 1 tablet (0.5 mg total) by mouth at bedtime.   saline Gel Place 1 Application into both nostrils every 4 (four) hours as needed (for dry nose).   simvastatin 20 MG tablet Commonly known as: ZOCOR 1-2 times weekly Monday and thursday   sodium chloride 1 g tablet Take 1 tablet (1 g total) by mouth 2 (two) times daily with a meal.   Vitamin D3 25 MCG (1000 UT) Caps Take 3,000 Units by mouth daily.       Allergies  Allergen Reactions   Codeine Nausea Only   Sulfa Antibiotics Itching and Nausea Only      The results of significant diagnostics from this hospitalization (including imaging, microbiology, ancillary and laboratory) are listed below for reference.    Significant Diagnostic Studies: US RENAL  Result Date: 11/18/2022 CLINICAL DATA:  [id number] ARF (acute renal failure) (HCC) [id number] EXAM: RENAL / URINARY TRACT ULTRASOUND COMPLETE COMPARISON:  CT 02/11/2028 FINDINGS: Right Kidney: Renal measurements: 6.8 x  2.9 x 2.6 cm = volume: 27 mL. Atrophic with increased cortical echogenicity. 1.1 cm lower pole cyst, which does not require follow-up imaging. No stone or hydronephrosis. Left Kidney: Renal measurements: 10.5 x 5.5 x 5.6 cm = volume: 182 mL. Echogenicity within normal limits. No mass or hydronephrosis visualized. Bladder: Appears normal for degree of bladder distention. Other: None. IMPRESSION: 1. No hydronephrosis. 2. Chronically atrophic right kidney. Electronically Signed   By: Duanne Guess D.O.   On: 11/18/2022 16:31   CT Head Wo Contrast  Result Date: 11/17/2022 CLINICAL DATA:  Head trauma, fall over the weekend.  Weakness. EXAM: CT HEAD WITHOUT CONTRAST TECHNIQUE: Contiguous axial images were obtained from the base of the skull through the vertex without intravenous contrast. RADIATION DOSE REDUCTION: This exam was performed according to the departmental dose-optimization program which includes automated exposure control, adjustment of the mA and/or  kV according to patient size and/or use of iterative reconstruction technique. COMPARISON:  02/18/2022 FINDINGS: Brain: No acute intracranial hemorrhage. No subdural or extra-axial collection. Stable atrophy and chronic small vessel ischemic change. No midline shift or mass lesion/mass effect. Vascular: Atherosclerosis of skullbase vasculature without hyperdense vessel or abnormal calcification. Skull: No fracture or focal lesion. Sinuses/Orbits: Chronic opacification of left maxillary sinus. Chronic partial opacification of left mastoid air cells. Bilateral lens resection. Non fusion posterior arch of C1, partially included. Other: No confluent scalp contusion. IMPRESSION: 1. No acute intracranial abnormality. No skull fracture. 2. Stable atrophy and chronic small vessel ischemic change. Electronically Signed   By: Narda Rutherford M.D.   On: 11/17/2022 18:41   DG Lumbar Spine Complete  Result Date: 11/17/2022 CLINICAL DATA:  Pain and weakness. EXAM: LUMBAR SPINE - COMPLETE 4+ VIEW COMPARISON:  None Available. FINDINGS: Five non-rib-bearing lumbar vertebra. Levo scoliotic curvature centered at L1-L2. There is grade 1 anterolisthesis of L5 on S1. No evidence of fracture. No compression deformity. Moderate diffuse degenerative disc disease and facet hypertrophy. Sacroiliac joints are congruent with mild degenerative change. Aortic atherosclerosis. IMPRESSION: 1. No acute fracture. 2. Moderate diffuse degenerative disc disease and facet hypertrophy. 3. Levo scoliotic curvature centered at L1-L2. Electronically Signed   By: Narda Rutherford M.D.   On: 11/17/2022 18:36   DG Pelvis 1-2 Views  Result Date: 11/17/2022 CLINICAL DATA:  Pain and weakness.  Fall over the weekend. EXAM: PELVIS - 1-2 VIEW COMPARISON:  None Available. FINDINGS: Left hip arthroplasty, intact were visualized. The distal femoral stem is not entirely included in the field of view. No acute pelvic or periprosthetic fracture. The pubic rami are  intact. Pubic symphysis and sacroiliac joints are congruent. Vascular calcifications are seen. Surgical clips in the left groin. IMPRESSION: No acute fracture of the pelvis. Left hip arthroplasty, included portion is intact. Electronically Signed   By: Narda Rutherford M.D.   On: 11/17/2022 18:34    Microbiology: No results found for this or any previous visit (from the past 240 hour(s)).   Labs: Basic Metabolic Panel: Recent Labs  Lab 11/22/22 0809 11/22/22 1339 11/23/22 0541 11/23/22 1236 11/24/22 1031 11/25/22 0651 11/26/22 0716  NA 126*   < > 125* 125* 129* 127* 128*  K 3.9  --  3.8  --  4.0 3.8 3.9  CL 97*  --  95*  --  94* 93* 93*  CO2 21*  --  22  --  24 24 25   GLUCOSE 103*  --  92  --  114* 97 108*  BUN 14  --  33*  --  57* 57* 79*  CREATININE 0.99  --  1.14*  --  1.17* 1.20* 1.15*  CALCIUM 9.0  --  8.7*  --  9.6 9.2 9.6  PHOS  --   --   --   --  3.4  --   --    < > = values in this interval not displayed.   Liver Function Tests: Recent Labs  Lab 11/19/22 1007 11/20/22 1226 11/21/22 0720 11/24/22 1031  AST 45* 34 34  --   ALT 42 36 36  --   ALKPHOS 49 51 44  --   BILITOT 0.7 0.6 0.3  --   PROT 6.1* 6.1* 5.9*  --   ALBUMIN 3.3* 3.2* 3.0* 3.4*   No results for input(s): "LIPASE", "AMYLASE" in the last 168 hours. No results for input(s): "AMMONIA" in the last 168 hours. CBC: Recent Labs  Lab 11/19/22 1007 11/20/22 1226 11/21/22 0720  WBC 8.4 6.7 7.5  HGB 12.0 12.0 11.9*  HCT 35.8* 36.0 35.2*  MCV 88.6 88.0 87.6  PLT 289 303 301   Cardiac Enzymes: No results for input(s): "CKTOTAL", "CKMB", "CKMBINDEX", "TROPONINI" in the last 168 hours. BNP: BNP (last 3 results) No results for input(s): "BNP" in the last 8760 hours.  ProBNP (last 3 results) No results for input(s): "PROBNP" in the last 8760 hours.  CBG: No results for input(s): "GLUCAP" in the last 168 hours.     Signed:  Rhetta Mura MD   [facility] 11/26/2022, 9:55  AM

## 2022-11-26 NOTE — Plan of Care (Signed)
Patient is discharged.  Family is taking her to [facility].

## 2022-11-26 NOTE — TOC Transition Note (Signed)
Transition of Care [name]) - CM/SW Discharge Note   Patient Details  Name: Stacy Erickson MRN: [id number] Date of Birth: 04/21/1935  [name]) CM/SW Contact:  Kermit Balo, RN Phone Number: 11/26/2022, 11:59 AM   Clinical Narrative:     [name] had an admission cancellation so they are able to accept the patient today. Cm has updated the MD, pt and son. Son plans to provide transportation. Bedside RN updated.   Room: 311A Number for report: [phone]  Final next level of care: Skilled Nursing Facility Barriers to Discharge: No Barriers Identified   Patient Goals and CMS Choice CMS Medicare.gov Compare Post Acute Care list provided to:: Patient Represenative (must comment) Choice offered to / list presented to : Adult Children  Discharge Placement                Patient chooses bed at: [name] Patient to be transferred to facility by: son Name of family member notified: son and daughter Patient and family notified of of transfer: 11/26/22  Discharge Plan and Services Additional resources added to the After Visit Summary for     Discharge Planning Services: CM Consult                                 Social Determinants of Health (SDOH) Interventions SDOH Screenings   Food Insecurity: No Food Insecurity (11/17/2022)  Housing: Low Risk  (11/17/2022)  Transportation Needs: No Transportation Needs (11/17/2022)  Utilities: Not At Risk (11/17/2022)  Depression (PHQ2-9): Low Risk  (09/01/2021)  Financial Resource Strain: Low Risk  (09/01/2021)  Social Connections: Moderately Integrated (09/01/2021)  Stress: No Stress Concern Present (09/01/2021)  Tobacco Use: Medium Risk (11/17/2022)     Readmission Risk Interventions     No data to display

## 2022-11-26 NOTE — Progress Notes (Signed)
Physical Therapy Treatment Patient Details Name: Stacy Erickson MRN: [id number] DOB: September 29, 1935 Today's Date: 11/26/2022   History of Present Illness 87 y.o. female admitted 9/4 with confusion and falls. Pt with UTI and encephalopathy. hyponatremia. PMHx: HLD, HTN , PAD , GERD , Renal a stenosis, skin CA, THA    PT Comments  Very pleasant and eager to participate but admittedly a bit more fatigued today. Pt with improved function, demonstrating ability to ambulate with intermittent min assist and a RW for support up to 65 feet today. She is a little more distracted and needs cues to facilitate and remain focused on task at hand, and orient back to location of room after walking a short distance into hallway. Reviewed LE exercises. Hopeful to get to rehab soon. Patient will continue to benefit from skilled physical therapy services to further improve independence with functional mobility.     If plan is discharge home, recommend the following: A little help with walking and/or transfers;Assistance with cooking/housework;Assist for transportation;Supervision due to cognitive status;Direct supervision/assist for medications management;Direct supervision/assist for financial management;A lot of help with bathing/dressing/bathroom   Can travel by private vehicle     Yes  Equipment Recommendations  BSC/3in1    Recommendations for Other Services       Precautions / Restrictions Precautions Precautions: Fall Precaution Comments: incontinent Restrictions Weight Bearing Restrictions: No     Mobility  Bed Mobility Overal bed mobility: Needs Assistance Bed Mobility: Supine to Sit     Supine to sit: HOB elevated, Used rails, Min assist     General bed mobility comments: Min assist to facilitate LEs out of bed, requires frequent VC.    Transfers Overall transfer level: Needs assistance Equipment used: Rolling walker (2 wheels) Transfers: Sit to/from Stand Sit to Stand: Min assist            General transfer comment: Min assist for boost to stand, slow to rise but stable once upright with RW today. Still requires cues for technique and hand placement.    Ambulation/Gait Ambulation/Gait assistance: Min assist Gait Distance (Feet): 65 Feet Assistive device: Rolling walker (2 wheels) Gait Pattern/deviations: Step-through pattern, Trunk flexed, Shuffle, Leaning posteriorly, Drifts right/left Gait velocity: decr Gait velocity interpretation: <1.31 ft/sec, indicative of household ambulator   General Gait Details: Intermittent assist for balance an intermittent min assist level with cues for awareness. Drifting with RW but able to self correct. Cues for sequencing and walker placement. Early assist for walker placement.   Stairs             Wheelchair Mobility     Tilt Bed    Modified Rankin (Stroke Patients Only)       Balance Overall balance assessment: Needs assistance, History of Falls Sitting-balance support: Feet supported, No upper extremity supported Sitting balance-Leahy Scale: Fair Sitting balance - Comments: EOB   Standing balance support: Single extremity supported, During functional activity Standing balance-Leahy Scale: Poor Standing balance comment: Holds RW with single UE                            Cognition Arousal: Alert Behavior During Therapy: WFL for tasks assessed/performed Overall Cognitive Status: Impaired/Different from baseline Area of Impairment: Following commands, Safety/judgement, Problem solving                       Following Commands: Follows one step commands with increased time, Follows one step commands consistently Safety/Judgement: Decreased awareness  of safety, Decreased awareness of deficits   Problem Solving: Slow processing, Requires verbal cues, Decreased initiation General Comments: Required additional cues to facilitate and remain focused on task at hand today. Some difficulty  redirecting back to room.        Exercises General Exercises - Lower Extremity Ankle Circles/Pumps: AROM, Both, 10 reps, Seated Quad Sets: Strengthening, Both, 10 reps, Seated Long Arc Quad: Strengthening, Both, 10 reps, Seated Heel Slides: Strengthening, Both, 10 reps, Supine Hip ABduction/ADduction: Strengthening, Both, 10 reps, Seated Straight Leg Raises: Strengthening, Both, 10 reps, Seated    General Comments General comments (skin integrity, edema, etc.): HR in 70s during session.      Pertinent Vitals/Pain Pain Assessment Pain Assessment: No/denies pain    Home Living                          Prior Function            PT Goals (current goals can now be found in the care plan section) Acute Rehab PT Goals Patient Stated Goal: return home, walk safely PT Goal Formulation: With patient/family Time For Goal Achievement: 12/02/22 Potential to Achieve Goals: Fair Progress towards PT goals: Progressing toward goals    Frequency    Min 1X/week      PT Plan      Co-evaluation              AM-PAC PT "6 Clicks" Mobility   Outcome Measure  Help needed turning from your back to your side while in a flat bed without using bedrails?: A Little Help needed moving from lying on your back to sitting on the side of a flat bed without using bedrails?: A Little Help needed moving to and from a bed to a chair (including a wheelchair)?: A Little Help needed standing up from a chair using your arms (e.g., wheelchair or bedside chair)?: A Little Help needed to walk in hospital room?: A Little Help needed climbing 3-5 steps with a railing? : A Little 6 Click Score: 18    End of Session Equipment Utilized During Treatment: Gait belt Activity Tolerance: Patient tolerated treatment well Patient left: with call bell/phone within reach;with chair alarm set;with family/visitor present;in bed (Chair mode) Nurse Communication: Mobility status PT Visit Diagnosis:  Other abnormalities of gait and mobility (R26.89);History of falling (Z91.81);Unsteadiness on feet (R26.81)     Time: 1610-9604 PT Time Calculation (min) (ACUTE ONLY): 20 min  Charges:    $Gait Training: 8-22 mins PT General Charges $$ ACUTE PT VISIT: 1 Visit                     Kathlyn Sacramento, PT, DPT [facility]  Rehabilitation Services Physical Therapist Office: [phone] Website: [email or web address]    Berton Mount 11/26/2022, 12:00 PM

## 2022-11-26 NOTE — Progress Notes (Signed)
[facility] NEPHROLOGY PROGRESS NOTE  Assessment/ Plan:  # Acute on chronic hyponatremia - hypotonic euvolemic hyponatremia. UNa is normal (not low) and UOsm is high. This is an SIADH like situation. Pt had a case of shingles last week, and there are a number of case reports of the infection causing hyponatremia/ SIADH (not sure whether due to the virus or the painful condition it causes). No sig CHF, liver or sig renal failure. TSH wnl, am cortisol wnl.  She was treated with tolvaptan on 9/7 and 9/10.  Also receiving treatment with salt tablet and urea.  It seems like she is having difficulty with fluid restriction.  The sodium level has improved to 128 today which is her baseline.   Today, I emphasized on fluid restriction.  We will DC urea but continue salt tablet to 1 g twice daily.  Patient can have lab with PCP in a week.  PCP can refer patient to nephrologist if no improvement in lab.  Reviewed with the patient and her son.    # HTN: Continue current antihypertensive, monitor BP.  Seems like diuretics and ARB on hold during this hospitalization.  This can be resumed as outpatient.  # AKI on CKD 3B: Creatinine level is around baseline.  She has atrophied right kidney history of really solitary kidney.  Monitor lab as outpatient.  # E. coli cystitis/UTI.  Completed antibiotics.  Discussed with the primary team as well.  Subjective: Seen and examined at bedside.  Urine output is around 600 cc recorded.  She is having difficulty fluid restriction.  Discussed with the nursing staff, patient's son.  No other new event.  Objective Vital signs in last 24 hours: Vitals:   11/25/22 2005 11/25/22 2355 11/26/22 0328 11/26/22 0740  BP: (!) 129/56 132/60 (!) 158/64 132/63  Pulse: 76 74 74 72  Resp: 18 16 16 17   Temp: 98.1 F (36.7 C) 97.9 F (36.6 C) 97.7 F (36.5 C) 97.8 F (36.6 C)  TempSrc: Oral Oral Oral Oral  SpO2: 100% 96% 98% 98%  Weight:      Height:       Weight  change:   Intake/Output Summary (Last 24 hours) at 11/26/2022 0948 Last data filed at 11/25/2022 1838 Gross per 24 hour  Intake 532 ml  Output 600 ml  Net -68 ml       Labs: RENAL PANEL Recent Labs  Lab 11/19/22 1007 11/20/22 1226 11/21/22 0720 11/21/22 1330 11/22/22 0809 11/22/22 1339 11/23/22 0541 11/23/22 1236 11/24/22 1031 11/25/22 0651 11/26/22 0716  NA 126* 121* 123*   < > 126*   < > 125* 125* 129* 127* 128*  K 4.0 4.2 4.1  --  3.9  --  3.8  --  4.0 3.8 3.9  CL 93* 94* 91*  --  97*  --  95*  --  94* 93* 93*  CO2 21* 22 22  --  21*  --  22  --  24 24 25   GLUCOSE 119* 114* 103*  --  103*  --  92  --  114* 97 108*  BUN 16 10 12   --  14  --  33*  --  57* 57* 79*  CREATININE 1.07* 0.99 0.96  --  0.99  --  1.14*  --  1.17* 1.20* 1.15*  CALCIUM 8.8* 8.3* 9.0  --  9.0  --  8.7*  --  9.6 9.2 9.6  PHOS  --   --   --   --   --   --   --   --  3.4  --   --   ALBUMIN 3.3* 3.2* 3.0*  --   --   --   --   --  3.4*  --   --    < > = values in this interval not displayed.    Liver Function Tests: Recent Labs  Lab 11/19/22 1007 11/20/22 1226 11/21/22 0720 11/24/22 1031  AST 45* 34 34  --   ALT 42 36 36  --   ALKPHOS 49 51 44  --   BILITOT 0.7 0.6 0.3  --   PROT 6.1* 6.1* 5.9*  --   ALBUMIN 3.3* 3.2* 3.0* 3.4*   No results for input(s): "LIPASE", "AMYLASE" in the last 168 hours. No results for input(s): "AMMONIA" in the last 168 hours. CBC: Recent Labs    11/17/22 2349 11/18/22 0924 11/19/22 1007 11/20/22 1226 11/21/22 0720  HGB 12.8 13.3 12.0 12.0 11.9*  MCV 88.2 87.7 88.6 88.0 87.6    Cardiac Enzymes: No results for input(s): "CKTOTAL", "CKMB", "CKMBINDEX", "TROPONINI" in the last 168 hours. CBG: No results for input(s): "GLUCAP" in the last 168 hours.  Iron Studies: No results for input(s): "IRON", "TIBC", "TRANSFERRIN", "FERRITIN" in the last 72 hours. Studies/Results: No results found.  Medications: Infusions:   Scheduled Medications:  amLODipine   2.5 mg Oral Daily   fluticasone  1 spray Each Nare Daily   gabapentin  200 mg Oral BID   heparin  5,000 Units Subcutaneous Q8H   hydrALAZINE  50 mg Oral Q8H   mirabegron ER  50 mg Oral Daily   multivitamin with minerals  1 tablet Oral Daily   nebivolol  10 mg Oral Daily   pantoprazole  40 mg Oral Daily   rOPINIRole  0.5 mg Oral QHS   simvastatin  20 mg Oral q1800   sodium chloride  1 g Oral TID WC   urea  30 g Oral BID   valACYclovir  1,000 mg Oral BID    have reviewed scheduled and prn medications.  Physical Exam: General:NAD, comfortable Heart:RRR, s1s2 nl Lungs:clear b/l, no crackle Abdomen:soft, Non-tender, non-distended Extremities:No edema Neurology: Alert, awake and following commands.  Stacy Erickson Stacy Erickson 11/26/2022,9:48 AM  LOS: 9 days

## 2022-11-26 NOTE — TOC Progression Note (Signed)
Transition of Care [facility]) - Progression Note    Patient Details  Name: Stacy Erickson MRN: [id number] Date of Birth: 1935-04-27  Transition of [facility]) CM/SW Contact  Kermit Balo, RN Phone Number: 11/26/2022, 11:22 AM  Clinical Narrative:     Pt is discharged but [facility] doesn't have a female bed until Sunday per [place] [facility] [place]. CM has updated the family and provided the other bed offers. They have decided to wait until Sunday for [facility]. CM has updated the MD. Family is aware they chance paying for Saturday and Sunday if medicare doesn't cover those days. They still want her to stay.  [facility] with [facility] has reserved her a bed on Sunday and will update CM if a bed becomes available before then. TOC following.  Expected Discharge Plan: Skilled Nursing Facility Barriers to Discharge: Continued Medical Work up  Expected Discharge Plan and Services   Discharge Planning Services: CM Consult   Living arrangements for the past 2 months: Single Family Home Expected Discharge Date: 11/26/22                                     Social Determinants of Health (SDOH) Interventions SDOH Screenings   Food Insecurity: No Food Insecurity (11/17/2022)  Housing: Low Risk  (11/17/2022)  Transportation Needs: No Transportation Needs (11/17/2022)  Utilities: Not At Risk (11/17/2022)  Depression (PHQ2-9): Low Risk  (09/01/2021)  Financial Resource Strain: Low Risk  (09/01/2021)  Social Connections: Moderately Integrated (09/01/2021)  Stress: No Stress Concern Present (09/01/2021)  Tobacco Use: Medium Risk (11/17/2022)    Readmission Risk Interventions     No data to display

## 2022-12-01 DIAGNOSIS — I700 Atherosclerosis of aorta: Secondary | ICD-10-CM | POA: Diagnosis not present

## 2022-12-01 DIAGNOSIS — E538 Deficiency of other specified B group vitamins: Secondary | ICD-10-CM | POA: Diagnosis not present

## 2022-12-01 DIAGNOSIS — I739 Peripheral vascular disease, unspecified: Secondary | ICD-10-CM | POA: Diagnosis not present

## 2022-12-01 DIAGNOSIS — I10 Essential (primary) hypertension: Secondary | ICD-10-CM | POA: Diagnosis not present

## 2022-12-02 DIAGNOSIS — R531 Weakness: Secondary | ICD-10-CM | POA: Diagnosis not present

## 2022-12-02 DIAGNOSIS — I739 Peripheral vascular disease, unspecified: Secondary | ICD-10-CM | POA: Diagnosis not present

## 2022-12-02 DIAGNOSIS — G5692 Unspecified mononeuropathy of left upper limb: Secondary | ICD-10-CM | POA: Diagnosis not present

## 2022-12-02 DIAGNOSIS — I10 Essential (primary) hypertension: Secondary | ICD-10-CM | POA: Diagnosis not present

## 2022-12-02 DIAGNOSIS — Z8639 Personal history of other endocrine, nutritional and metabolic disease: Secondary | ICD-10-CM | POA: Diagnosis not present

## 2022-12-03 DIAGNOSIS — R531 Weakness: Secondary | ICD-10-CM | POA: Diagnosis not present

## 2022-12-03 DIAGNOSIS — I739 Peripheral vascular disease, unspecified: Secondary | ICD-10-CM | POA: Diagnosis not present

## 2022-12-03 DIAGNOSIS — G5692 Unspecified mononeuropathy of left upper limb: Secondary | ICD-10-CM | POA: Diagnosis not present

## 2022-12-03 DIAGNOSIS — I10 Essential (primary) hypertension: Secondary | ICD-10-CM | POA: Diagnosis not present

## 2022-12-03 DIAGNOSIS — Z8639 Personal history of other endocrine, nutritional and metabolic disease: Secondary | ICD-10-CM | POA: Diagnosis not present

## 2022-12-06 DIAGNOSIS — M6281 Muscle weakness (generalized): Secondary | ICD-10-CM | POA: Diagnosis not present

## 2022-12-06 DIAGNOSIS — I739 Peripheral vascular disease, unspecified: Secondary | ICD-10-CM | POA: Diagnosis not present

## 2022-12-06 DIAGNOSIS — Z8639 Personal history of other endocrine, nutritional and metabolic disease: Secondary | ICD-10-CM | POA: Diagnosis not present

## 2022-12-06 DIAGNOSIS — R2681 Unsteadiness on feet: Secondary | ICD-10-CM | POA: Diagnosis not present

## 2022-12-06 DIAGNOSIS — E871 Hypo-osmolality and hyponatremia: Secondary | ICD-10-CM | POA: Diagnosis not present

## 2022-12-06 DIAGNOSIS — N179 Acute kidney failure, unspecified: Secondary | ICD-10-CM | POA: Diagnosis not present

## 2022-12-06 DIAGNOSIS — K2100 Gastro-esophageal reflux disease with esophagitis, without bleeding: Secondary | ICD-10-CM | POA: Diagnosis not present

## 2022-12-06 DIAGNOSIS — G5692 Unspecified mononeuropathy of left upper limb: Secondary | ICD-10-CM | POA: Diagnosis not present

## 2022-12-06 DIAGNOSIS — I10 Essential (primary) hypertension: Secondary | ICD-10-CM | POA: Diagnosis not present

## 2022-12-06 DIAGNOSIS — B029 Zoster without complications: Secondary | ICD-10-CM | POA: Diagnosis not present

## 2022-12-06 DIAGNOSIS — R2689 Other abnormalities of gait and mobility: Secondary | ICD-10-CM | POA: Diagnosis not present

## 2022-12-08 ENCOUNTER — Other Ambulatory Visit: Payer: Self-pay | Admitting: *Deleted

## 2022-12-08 NOTE — Patient Outreach (Signed)
Stacy Erickson resides in [facility] skilled nursing facility. Screening for potential care coordination/ chronic care management services as a benefit of health plan and primary care provider.  Collaboration with Stacy Erickson, Geophysicist/field seismologist. Stacy Erickson is from home alone. Anticipated transition plan is to return home. Has supportive family.   Will continue to follow for potential chronic care management/care coordination needs.   Stacy Noble, MSN, RN, BSN [place]  [facility] RN Post- Acute Care Coordinator Direct Dial: [phone]

## 2022-12-10 DIAGNOSIS — E222 Syndrome of inappropriate secretion of antidiuretic hormone: Secondary | ICD-10-CM | POA: Diagnosis not present

## 2022-12-10 DIAGNOSIS — N261 Atrophy of kidney (terminal): Secondary | ICD-10-CM | POA: Diagnosis not present

## 2022-12-10 DIAGNOSIS — I129 Hypertensive chronic kidney disease with stage 1 through stage 4 chronic kidney disease, or unspecified chronic kidney disease: Secondary | ICD-10-CM | POA: Diagnosis not present

## 2022-12-10 DIAGNOSIS — N1831 Chronic kidney disease, stage 3a: Secondary | ICD-10-CM | POA: Diagnosis not present

## 2022-12-10 DIAGNOSIS — I701 Atherosclerosis of renal artery: Secondary | ICD-10-CM | POA: Diagnosis not present

## 2022-12-16 DIAGNOSIS — I739 Peripheral vascular disease, unspecified: Secondary | ICD-10-CM | POA: Diagnosis not present

## 2022-12-16 DIAGNOSIS — N179 Acute kidney failure, unspecified: Secondary | ICD-10-CM | POA: Diagnosis not present

## 2022-12-16 DIAGNOSIS — I10 Essential (primary) hypertension: Secondary | ICD-10-CM | POA: Diagnosis not present

## 2022-12-16 DIAGNOSIS — M6281 Muscle weakness (generalized): Secondary | ICD-10-CM | POA: Diagnosis not present

## 2022-12-16 DIAGNOSIS — K2100 Gastro-esophageal reflux disease with esophagitis, without bleeding: Secondary | ICD-10-CM | POA: Diagnosis not present

## 2022-12-16 DIAGNOSIS — R2689 Other abnormalities of gait and mobility: Secondary | ICD-10-CM | POA: Diagnosis not present

## 2022-12-16 DIAGNOSIS — G5692 Unspecified mononeuropathy of left upper limb: Secondary | ICD-10-CM | POA: Diagnosis not present

## 2022-12-16 DIAGNOSIS — B029 Zoster without complications: Secondary | ICD-10-CM | POA: Diagnosis not present

## 2022-12-16 DIAGNOSIS — Z8639 Personal history of other endocrine, nutritional and metabolic disease: Secondary | ICD-10-CM | POA: Diagnosis not present

## 2022-12-16 DIAGNOSIS — E871 Hypo-osmolality and hyponatremia: Secondary | ICD-10-CM | POA: Diagnosis not present

## 2022-12-21 DIAGNOSIS — G5692 Unspecified mononeuropathy of left upper limb: Secondary | ICD-10-CM | POA: Diagnosis not present

## 2022-12-21 DIAGNOSIS — E871 Hypo-osmolality and hyponatremia: Secondary | ICD-10-CM | POA: Diagnosis not present

## 2022-12-21 DIAGNOSIS — N179 Acute kidney failure, unspecified: Secondary | ICD-10-CM | POA: Diagnosis not present

## 2022-12-21 DIAGNOSIS — I739 Peripheral vascular disease, unspecified: Secondary | ICD-10-CM | POA: Diagnosis not present

## 2022-12-21 DIAGNOSIS — K2100 Gastro-esophageal reflux disease with esophagitis, without bleeding: Secondary | ICD-10-CM | POA: Diagnosis not present

## 2022-12-21 DIAGNOSIS — B029 Zoster without complications: Secondary | ICD-10-CM | POA: Diagnosis not present

## 2022-12-28 DIAGNOSIS — K2100 Gastro-esophageal reflux disease with esophagitis, without bleeding: Secondary | ICD-10-CM | POA: Diagnosis not present

## 2022-12-28 DIAGNOSIS — E871 Hypo-osmolality and hyponatremia: Secondary | ICD-10-CM | POA: Diagnosis not present

## 2022-12-28 DIAGNOSIS — I739 Peripheral vascular disease, unspecified: Secondary | ICD-10-CM | POA: Diagnosis not present

## 2022-12-28 DIAGNOSIS — B029 Zoster without complications: Secondary | ICD-10-CM | POA: Diagnosis not present

## 2022-12-28 DIAGNOSIS — G5692 Unspecified mononeuropathy of left upper limb: Secondary | ICD-10-CM | POA: Diagnosis not present

## 2022-12-28 DIAGNOSIS — N179 Acute kidney failure, unspecified: Secondary | ICD-10-CM | POA: Diagnosis not present

## 2022-12-28 DIAGNOSIS — I10 Essential (primary) hypertension: Secondary | ICD-10-CM | POA: Diagnosis not present

## 2022-12-29 DIAGNOSIS — I10 Essential (primary) hypertension: Secondary | ICD-10-CM | POA: Diagnosis not present

## 2022-12-29 DIAGNOSIS — N390 Urinary tract infection, site not specified: Secondary | ICD-10-CM | POA: Diagnosis not present

## 2023-01-03 DIAGNOSIS — D649 Anemia, unspecified: Secondary | ICD-10-CM | POA: Diagnosis not present

## 2023-01-06 DIAGNOSIS — E538 Deficiency of other specified B group vitamins: Secondary | ICD-10-CM | POA: Diagnosis not present

## 2023-01-06 DIAGNOSIS — I700 Atherosclerosis of aorta: Secondary | ICD-10-CM | POA: Diagnosis not present

## 2023-01-06 DIAGNOSIS — I10 Essential (primary) hypertension: Secondary | ICD-10-CM | POA: Diagnosis not present

## 2023-01-06 DIAGNOSIS — I739 Peripheral vascular disease, unspecified: Secondary | ICD-10-CM | POA: Diagnosis not present

## 2023-01-06 DIAGNOSIS — D649 Anemia, unspecified: Secondary | ICD-10-CM | POA: Diagnosis not present

## 2023-01-11 DIAGNOSIS — D649 Anemia, unspecified: Secondary | ICD-10-CM | POA: Diagnosis not present

## 2023-01-11 DIAGNOSIS — I10 Essential (primary) hypertension: Secondary | ICD-10-CM | POA: Diagnosis not present

## 2023-01-20 DIAGNOSIS — D509 Iron deficiency anemia, unspecified: Secondary | ICD-10-CM | POA: Diagnosis not present

## 2023-01-20 DIAGNOSIS — I10 Essential (primary) hypertension: Secondary | ICD-10-CM | POA: Diagnosis not present

## 2023-01-20 DIAGNOSIS — M19019 Primary osteoarthritis, unspecified shoulder: Secondary | ICD-10-CM | POA: Diagnosis not present

## 2023-01-20 DIAGNOSIS — M8580 Other specified disorders of bone density and structure, unspecified site: Secondary | ICD-10-CM | POA: Diagnosis not present

## 2023-01-20 DIAGNOSIS — G2581 Restless legs syndrome: Secondary | ICD-10-CM | POA: Diagnosis not present

## 2023-01-20 DIAGNOSIS — E785 Hyperlipidemia, unspecified: Secondary | ICD-10-CM | POA: Diagnosis not present

## 2023-01-20 DIAGNOSIS — I739 Peripheral vascular disease, unspecified: Secondary | ICD-10-CM | POA: Diagnosis not present

## 2023-01-20 DIAGNOSIS — I701 Atherosclerosis of renal artery: Secondary | ICD-10-CM | POA: Diagnosis not present

## 2023-01-20 DIAGNOSIS — N390 Urinary tract infection, site not specified: Secondary | ICD-10-CM | POA: Diagnosis not present

## 2023-01-20 DIAGNOSIS — J449 Chronic obstructive pulmonary disease, unspecified: Secondary | ICD-10-CM | POA: Diagnosis not present

## 2023-01-20 DIAGNOSIS — B029 Zoster without complications: Secondary | ICD-10-CM | POA: Diagnosis not present

## 2023-01-20 DIAGNOSIS — E538 Deficiency of other specified B group vitamins: Secondary | ICD-10-CM | POA: Diagnosis not present

## 2023-01-23 DIAGNOSIS — Z23 Encounter for immunization: Secondary | ICD-10-CM | POA: Diagnosis not present

## 2023-01-26 DIAGNOSIS — M5135 Other intervertebral disc degeneration, thoracolumbar region: Secondary | ICD-10-CM | POA: Diagnosis not present

## 2023-01-26 DIAGNOSIS — I701 Atherosclerosis of renal artery: Secondary | ICD-10-CM | POA: Diagnosis not present

## 2023-01-26 DIAGNOSIS — N3281 Overactive bladder: Secondary | ICD-10-CM | POA: Diagnosis not present

## 2023-01-26 DIAGNOSIS — Z9181 History of falling: Secondary | ICD-10-CM | POA: Diagnosis not present

## 2023-01-26 DIAGNOSIS — G2581 Restless legs syndrome: Secondary | ICD-10-CM | POA: Diagnosis not present

## 2023-01-26 DIAGNOSIS — H18519 Endothelial corneal dystrophy, unspecified eye: Secondary | ICD-10-CM | POA: Diagnosis not present

## 2023-01-26 DIAGNOSIS — K219 Gastro-esophageal reflux disease without esophagitis: Secondary | ICD-10-CM | POA: Diagnosis not present

## 2023-01-26 DIAGNOSIS — E785 Hyperlipidemia, unspecified: Secondary | ICD-10-CM | POA: Diagnosis not present

## 2023-01-26 DIAGNOSIS — Z87440 Personal history of urinary (tract) infections: Secondary | ICD-10-CM | POA: Diagnosis not present

## 2023-01-26 DIAGNOSIS — G4700 Insomnia, unspecified: Secondary | ICD-10-CM | POA: Diagnosis not present

## 2023-01-26 DIAGNOSIS — Z96642 Presence of left artificial hip joint: Secondary | ICD-10-CM | POA: Diagnosis not present

## 2023-01-26 DIAGNOSIS — I739 Peripheral vascular disease, unspecified: Secondary | ICD-10-CM | POA: Diagnosis not present

## 2023-01-26 DIAGNOSIS — M19019 Primary osteoarthritis, unspecified shoulder: Secondary | ICD-10-CM | POA: Diagnosis not present

## 2023-01-26 DIAGNOSIS — M8580 Other specified disorders of bone density and structure, unspecified site: Secondary | ICD-10-CM | POA: Diagnosis not present

## 2023-01-26 DIAGNOSIS — Z87891 Personal history of nicotine dependence: Secondary | ICD-10-CM | POA: Diagnosis not present

## 2023-01-26 DIAGNOSIS — I10 Essential (primary) hypertension: Secondary | ICD-10-CM | POA: Diagnosis not present

## 2023-01-26 DIAGNOSIS — Z79899 Other long term (current) drug therapy: Secondary | ICD-10-CM | POA: Diagnosis not present

## 2023-01-26 DIAGNOSIS — D692 Other nonthrombocytopenic purpura: Secondary | ICD-10-CM | POA: Diagnosis not present

## 2023-01-26 DIAGNOSIS — I440 Atrioventricular block, first degree: Secondary | ICD-10-CM | POA: Diagnosis not present

## 2023-01-26 DIAGNOSIS — Z860100 Personal history of colon polyps, unspecified: Secondary | ICD-10-CM | POA: Diagnosis not present

## 2023-01-26 DIAGNOSIS — J449 Chronic obstructive pulmonary disease, unspecified: Secondary | ICD-10-CM | POA: Diagnosis not present

## 2023-01-26 DIAGNOSIS — E538 Deficiency of other specified B group vitamins: Secondary | ICD-10-CM | POA: Diagnosis not present

## 2023-01-26 DIAGNOSIS — G9341 Metabolic encephalopathy: Secondary | ICD-10-CM | POA: Diagnosis not present

## 2023-01-26 DIAGNOSIS — D509 Iron deficiency anemia, unspecified: Secondary | ICD-10-CM | POA: Diagnosis not present

## 2023-01-31 DIAGNOSIS — J449 Chronic obstructive pulmonary disease, unspecified: Secondary | ICD-10-CM | POA: Diagnosis not present

## 2023-01-31 DIAGNOSIS — G4700 Insomnia, unspecified: Secondary | ICD-10-CM | POA: Diagnosis not present

## 2023-01-31 DIAGNOSIS — G9341 Metabolic encephalopathy: Secondary | ICD-10-CM | POA: Diagnosis not present

## 2023-01-31 DIAGNOSIS — I10 Essential (primary) hypertension: Secondary | ICD-10-CM | POA: Diagnosis not present

## 2023-01-31 DIAGNOSIS — M5135 Other intervertebral disc degeneration, thoracolumbar region: Secondary | ICD-10-CM | POA: Diagnosis not present

## 2023-01-31 DIAGNOSIS — E785 Hyperlipidemia, unspecified: Secondary | ICD-10-CM | POA: Diagnosis not present

## 2023-02-02 DIAGNOSIS — E785 Hyperlipidemia, unspecified: Secondary | ICD-10-CM | POA: Diagnosis not present

## 2023-02-02 DIAGNOSIS — G9341 Metabolic encephalopathy: Secondary | ICD-10-CM | POA: Diagnosis not present

## 2023-02-02 DIAGNOSIS — J449 Chronic obstructive pulmonary disease, unspecified: Secondary | ICD-10-CM | POA: Diagnosis not present

## 2023-02-02 DIAGNOSIS — G4700 Insomnia, unspecified: Secondary | ICD-10-CM | POA: Diagnosis not present

## 2023-02-02 DIAGNOSIS — I10 Essential (primary) hypertension: Secondary | ICD-10-CM | POA: Diagnosis not present

## 2023-02-02 DIAGNOSIS — M5135 Other intervertebral disc degeneration, thoracolumbar region: Secondary | ICD-10-CM | POA: Diagnosis not present

## 2023-02-07 DIAGNOSIS — I440 Atrioventricular block, first degree: Secondary | ICD-10-CM | POA: Diagnosis not present

## 2023-02-07 DIAGNOSIS — I701 Atherosclerosis of renal artery: Secondary | ICD-10-CM | POA: Diagnosis not present

## 2023-02-07 DIAGNOSIS — K219 Gastro-esophageal reflux disease without esophagitis: Secondary | ICD-10-CM | POA: Diagnosis not present

## 2023-02-07 DIAGNOSIS — E785 Hyperlipidemia, unspecified: Secondary | ICD-10-CM | POA: Diagnosis not present

## 2023-02-07 DIAGNOSIS — I10 Essential (primary) hypertension: Secondary | ICD-10-CM | POA: Diagnosis not present

## 2023-02-07 DIAGNOSIS — G9341 Metabolic encephalopathy: Secondary | ICD-10-CM | POA: Diagnosis not present

## 2023-02-07 DIAGNOSIS — G4700 Insomnia, unspecified: Secondary | ICD-10-CM | POA: Diagnosis not present

## 2023-02-07 DIAGNOSIS — H18519 Endothelial corneal dystrophy, unspecified eye: Secondary | ICD-10-CM | POA: Diagnosis not present

## 2023-02-07 DIAGNOSIS — M5135 Other intervertebral disc degeneration, thoracolumbar region: Secondary | ICD-10-CM | POA: Diagnosis not present

## 2023-02-07 DIAGNOSIS — D509 Iron deficiency anemia, unspecified: Secondary | ICD-10-CM | POA: Diagnosis not present

## 2023-02-07 DIAGNOSIS — J449 Chronic obstructive pulmonary disease, unspecified: Secondary | ICD-10-CM | POA: Diagnosis not present

## 2023-02-07 DIAGNOSIS — I739 Peripheral vascular disease, unspecified: Secondary | ICD-10-CM | POA: Diagnosis not present

## 2023-02-08 DIAGNOSIS — G4700 Insomnia, unspecified: Secondary | ICD-10-CM | POA: Diagnosis not present

## 2023-02-08 DIAGNOSIS — J449 Chronic obstructive pulmonary disease, unspecified: Secondary | ICD-10-CM | POA: Diagnosis not present

## 2023-02-08 DIAGNOSIS — E785 Hyperlipidemia, unspecified: Secondary | ICD-10-CM | POA: Diagnosis not present

## 2023-02-08 DIAGNOSIS — M5135 Other intervertebral disc degeneration, thoracolumbar region: Secondary | ICD-10-CM | POA: Diagnosis not present

## 2023-02-08 DIAGNOSIS — G9341 Metabolic encephalopathy: Secondary | ICD-10-CM | POA: Diagnosis not present

## 2023-02-08 DIAGNOSIS — I10 Essential (primary) hypertension: Secondary | ICD-10-CM | POA: Diagnosis not present

## 2023-02-17 DIAGNOSIS — E785 Hyperlipidemia, unspecified: Secondary | ICD-10-CM | POA: Diagnosis not present

## 2023-02-17 DIAGNOSIS — J449 Chronic obstructive pulmonary disease, unspecified: Secondary | ICD-10-CM | POA: Diagnosis not present

## 2023-02-17 DIAGNOSIS — G4700 Insomnia, unspecified: Secondary | ICD-10-CM | POA: Diagnosis not present

## 2023-02-17 DIAGNOSIS — G9341 Metabolic encephalopathy: Secondary | ICD-10-CM | POA: Diagnosis not present

## 2023-02-17 DIAGNOSIS — I10 Essential (primary) hypertension: Secondary | ICD-10-CM | POA: Diagnosis not present

## 2023-02-17 DIAGNOSIS — G5603 Carpal tunnel syndrome, bilateral upper limbs: Secondary | ICD-10-CM | POA: Diagnosis not present

## 2023-02-17 DIAGNOSIS — M7542 Impingement syndrome of left shoulder: Secondary | ICD-10-CM | POA: Diagnosis not present

## 2023-02-17 DIAGNOSIS — M5135 Other intervertebral disc degeneration, thoracolumbar region: Secondary | ICD-10-CM | POA: Diagnosis not present

## 2023-02-17 DIAGNOSIS — M25512 Pain in left shoulder: Secondary | ICD-10-CM | POA: Diagnosis not present

## 2023-02-22 DIAGNOSIS — J449 Chronic obstructive pulmonary disease, unspecified: Secondary | ICD-10-CM | POA: Diagnosis not present

## 2023-02-22 DIAGNOSIS — G4700 Insomnia, unspecified: Secondary | ICD-10-CM | POA: Diagnosis not present

## 2023-02-22 DIAGNOSIS — G9341 Metabolic encephalopathy: Secondary | ICD-10-CM | POA: Diagnosis not present

## 2023-02-22 DIAGNOSIS — I10 Essential (primary) hypertension: Secondary | ICD-10-CM | POA: Diagnosis not present

## 2023-02-22 DIAGNOSIS — M5135 Other intervertebral disc degeneration, thoracolumbar region: Secondary | ICD-10-CM | POA: Diagnosis not present

## 2023-02-22 DIAGNOSIS — E785 Hyperlipidemia, unspecified: Secondary | ICD-10-CM | POA: Diagnosis not present

## 2023-02-23 DIAGNOSIS — H43813 Vitreous degeneration, bilateral: Secondary | ICD-10-CM | POA: Diagnosis not present

## 2023-02-23 DIAGNOSIS — Q141 Congenital malformation of retina: Secondary | ICD-10-CM | POA: Diagnosis not present

## 2023-02-23 DIAGNOSIS — H18513 Endothelial corneal dystrophy, bilateral: Secondary | ICD-10-CM | POA: Diagnosis not present

## 2023-02-23 DIAGNOSIS — Z961 Presence of intraocular lens: Secondary | ICD-10-CM | POA: Diagnosis not present

## 2023-02-24 DIAGNOSIS — G9341 Metabolic encephalopathy: Secondary | ICD-10-CM | POA: Diagnosis not present

## 2023-02-24 DIAGNOSIS — J449 Chronic obstructive pulmonary disease, unspecified: Secondary | ICD-10-CM | POA: Diagnosis not present

## 2023-02-24 DIAGNOSIS — M5135 Other intervertebral disc degeneration, thoracolumbar region: Secondary | ICD-10-CM | POA: Diagnosis not present

## 2023-02-24 DIAGNOSIS — E785 Hyperlipidemia, unspecified: Secondary | ICD-10-CM | POA: Diagnosis not present

## 2023-02-24 DIAGNOSIS — I10 Essential (primary) hypertension: Secondary | ICD-10-CM | POA: Diagnosis not present

## 2023-02-24 DIAGNOSIS — G4700 Insomnia, unspecified: Secondary | ICD-10-CM | POA: Diagnosis not present

## 2023-02-25 DIAGNOSIS — N3281 Overactive bladder: Secondary | ICD-10-CM | POA: Diagnosis not present

## 2023-02-25 DIAGNOSIS — Z87891 Personal history of nicotine dependence: Secondary | ICD-10-CM | POA: Diagnosis not present

## 2023-02-25 DIAGNOSIS — K219 Gastro-esophageal reflux disease without esophagitis: Secondary | ICD-10-CM | POA: Diagnosis not present

## 2023-02-25 DIAGNOSIS — I701 Atherosclerosis of renal artery: Secondary | ICD-10-CM | POA: Diagnosis not present

## 2023-02-25 DIAGNOSIS — D692 Other nonthrombocytopenic purpura: Secondary | ICD-10-CM | POA: Diagnosis not present

## 2023-02-25 DIAGNOSIS — E785 Hyperlipidemia, unspecified: Secondary | ICD-10-CM | POA: Diagnosis not present

## 2023-02-25 DIAGNOSIS — I739 Peripheral vascular disease, unspecified: Secondary | ICD-10-CM | POA: Diagnosis not present

## 2023-02-25 DIAGNOSIS — G2581 Restless legs syndrome: Secondary | ICD-10-CM | POA: Diagnosis not present

## 2023-02-25 DIAGNOSIS — I10 Essential (primary) hypertension: Secondary | ICD-10-CM | POA: Diagnosis not present

## 2023-02-25 DIAGNOSIS — D509 Iron deficiency anemia, unspecified: Secondary | ICD-10-CM | POA: Diagnosis not present

## 2023-02-25 DIAGNOSIS — Z79899 Other long term (current) drug therapy: Secondary | ICD-10-CM | POA: Diagnosis not present

## 2023-02-25 DIAGNOSIS — M8580 Other specified disorders of bone density and structure, unspecified site: Secondary | ICD-10-CM | POA: Diagnosis not present

## 2023-02-25 DIAGNOSIS — Z860100 Personal history of colon polyps, unspecified: Secondary | ICD-10-CM | POA: Diagnosis not present

## 2023-02-25 DIAGNOSIS — G9341 Metabolic encephalopathy: Secondary | ICD-10-CM | POA: Diagnosis not present

## 2023-02-25 DIAGNOSIS — Z87440 Personal history of urinary (tract) infections: Secondary | ICD-10-CM | POA: Diagnosis not present

## 2023-02-25 DIAGNOSIS — H18519 Endothelial corneal dystrophy, unspecified eye: Secondary | ICD-10-CM | POA: Diagnosis not present

## 2023-02-25 DIAGNOSIS — E538 Deficiency of other specified B group vitamins: Secondary | ICD-10-CM | POA: Diagnosis not present

## 2023-02-25 DIAGNOSIS — I440 Atrioventricular block, first degree: Secondary | ICD-10-CM | POA: Diagnosis not present

## 2023-02-25 DIAGNOSIS — Z9181 History of falling: Secondary | ICD-10-CM | POA: Diagnosis not present

## 2023-02-25 DIAGNOSIS — J449 Chronic obstructive pulmonary disease, unspecified: Secondary | ICD-10-CM | POA: Diagnosis not present

## 2023-02-25 DIAGNOSIS — G4700 Insomnia, unspecified: Secondary | ICD-10-CM | POA: Diagnosis not present

## 2023-02-25 DIAGNOSIS — M5135 Other intervertebral disc degeneration, thoracolumbar region: Secondary | ICD-10-CM | POA: Diagnosis not present

## 2023-02-25 DIAGNOSIS — Z96642 Presence of left artificial hip joint: Secondary | ICD-10-CM | POA: Diagnosis not present

## 2023-02-25 DIAGNOSIS — M19019 Primary osteoarthritis, unspecified shoulder: Secondary | ICD-10-CM | POA: Diagnosis not present

## 2023-03-01 DIAGNOSIS — I10 Essential (primary) hypertension: Secondary | ICD-10-CM | POA: Diagnosis not present

## 2023-03-01 DIAGNOSIS — E785 Hyperlipidemia, unspecified: Secondary | ICD-10-CM | POA: Diagnosis not present

## 2023-03-01 DIAGNOSIS — M5135 Other intervertebral disc degeneration, thoracolumbar region: Secondary | ICD-10-CM | POA: Diagnosis not present

## 2023-03-01 DIAGNOSIS — G4700 Insomnia, unspecified: Secondary | ICD-10-CM | POA: Diagnosis not present

## 2023-03-01 DIAGNOSIS — G9341 Metabolic encephalopathy: Secondary | ICD-10-CM | POA: Diagnosis not present

## 2023-03-01 DIAGNOSIS — J449 Chronic obstructive pulmonary disease, unspecified: Secondary | ICD-10-CM | POA: Diagnosis not present

## 2023-03-07 DIAGNOSIS — E785 Hyperlipidemia, unspecified: Secondary | ICD-10-CM | POA: Diagnosis not present

## 2023-03-07 DIAGNOSIS — G4700 Insomnia, unspecified: Secondary | ICD-10-CM | POA: Diagnosis not present

## 2023-03-07 DIAGNOSIS — M5135 Other intervertebral disc degeneration, thoracolumbar region: Secondary | ICD-10-CM | POA: Diagnosis not present

## 2023-03-07 DIAGNOSIS — J449 Chronic obstructive pulmonary disease, unspecified: Secondary | ICD-10-CM | POA: Diagnosis not present

## 2023-03-07 DIAGNOSIS — I10 Essential (primary) hypertension: Secondary | ICD-10-CM | POA: Diagnosis not present

## 2023-03-07 DIAGNOSIS — G9341 Metabolic encephalopathy: Secondary | ICD-10-CM | POA: Diagnosis not present

## 2023-03-14 DIAGNOSIS — G4700 Insomnia, unspecified: Secondary | ICD-10-CM | POA: Diagnosis not present

## 2023-03-14 DIAGNOSIS — I10 Essential (primary) hypertension: Secondary | ICD-10-CM | POA: Diagnosis not present

## 2023-03-14 DIAGNOSIS — J449 Chronic obstructive pulmonary disease, unspecified: Secondary | ICD-10-CM | POA: Diagnosis not present

## 2023-03-14 DIAGNOSIS — G9341 Metabolic encephalopathy: Secondary | ICD-10-CM | POA: Diagnosis not present

## 2023-03-14 DIAGNOSIS — M5135 Other intervertebral disc degeneration, thoracolumbar region: Secondary | ICD-10-CM | POA: Diagnosis not present

## 2023-03-14 DIAGNOSIS — E785 Hyperlipidemia, unspecified: Secondary | ICD-10-CM | POA: Diagnosis not present

## 2023-03-22 DIAGNOSIS — E785 Hyperlipidemia, unspecified: Secondary | ICD-10-CM | POA: Diagnosis not present

## 2023-03-22 DIAGNOSIS — J449 Chronic obstructive pulmonary disease, unspecified: Secondary | ICD-10-CM | POA: Diagnosis not present

## 2023-03-22 DIAGNOSIS — G4700 Insomnia, unspecified: Secondary | ICD-10-CM | POA: Diagnosis not present

## 2023-03-22 DIAGNOSIS — I10 Essential (primary) hypertension: Secondary | ICD-10-CM | POA: Diagnosis not present

## 2023-03-22 DIAGNOSIS — M5135 Other intervertebral disc degeneration, thoracolumbar region: Secondary | ICD-10-CM | POA: Diagnosis not present

## 2023-03-22 DIAGNOSIS — G9341 Metabolic encephalopathy: Secondary | ICD-10-CM | POA: Diagnosis not present

## 2023-03-23 DIAGNOSIS — L812 Freckles: Secondary | ICD-10-CM | POA: Diagnosis not present

## 2023-03-23 DIAGNOSIS — L720 Epidermal cyst: Secondary | ICD-10-CM | POA: Diagnosis not present

## 2023-03-23 DIAGNOSIS — L570 Actinic keratosis: Secondary | ICD-10-CM | POA: Diagnosis not present

## 2023-03-23 DIAGNOSIS — L821 Other seborrheic keratosis: Secondary | ICD-10-CM | POA: Diagnosis not present

## 2023-03-29 DIAGNOSIS — M79645 Pain in left finger(s): Secondary | ICD-10-CM | POA: Diagnosis not present

## 2023-03-29 DIAGNOSIS — I10 Essential (primary) hypertension: Secondary | ICD-10-CM | POA: Diagnosis not present

## 2023-03-29 DIAGNOSIS — M7552 Bursitis of left shoulder: Secondary | ICD-10-CM | POA: Diagnosis not present

## 2023-03-29 DIAGNOSIS — K5909 Other constipation: Secondary | ICD-10-CM | POA: Diagnosis not present

## 2023-03-29 DIAGNOSIS — E538 Deficiency of other specified B group vitamins: Secondary | ICD-10-CM | POA: Diagnosis not present

## 2023-03-29 DIAGNOSIS — K219 Gastro-esophageal reflux disease without esophagitis: Secondary | ICD-10-CM | POA: Diagnosis not present

## 2023-04-04 DIAGNOSIS — G5602 Carpal tunnel syndrome, left upper limb: Secondary | ICD-10-CM | POA: Diagnosis not present

## 2023-04-05 DIAGNOSIS — R2681 Unsteadiness on feet: Secondary | ICD-10-CM | POA: Diagnosis not present

## 2023-04-13 DIAGNOSIS — R278 Other lack of coordination: Secondary | ICD-10-CM | POA: Diagnosis not present

## 2023-04-13 DIAGNOSIS — M6281 Muscle weakness (generalized): Secondary | ICD-10-CM | POA: Diagnosis not present

## 2023-04-18 DIAGNOSIS — R278 Other lack of coordination: Secondary | ICD-10-CM | POA: Diagnosis not present

## 2023-04-18 DIAGNOSIS — M6281 Muscle weakness (generalized): Secondary | ICD-10-CM | POA: Diagnosis not present

## 2023-04-21 DIAGNOSIS — M6281 Muscle weakness (generalized): Secondary | ICD-10-CM | POA: Diagnosis not present

## 2023-04-21 DIAGNOSIS — R278 Other lack of coordination: Secondary | ICD-10-CM | POA: Diagnosis not present

## 2023-04-25 DIAGNOSIS — R278 Other lack of coordination: Secondary | ICD-10-CM | POA: Diagnosis not present

## 2023-04-25 DIAGNOSIS — M6281 Muscle weakness (generalized): Secondary | ICD-10-CM | POA: Diagnosis not present

## 2023-04-26 DIAGNOSIS — R2681 Unsteadiness on feet: Secondary | ICD-10-CM | POA: Diagnosis not present

## 2023-04-29 DIAGNOSIS — R278 Other lack of coordination: Secondary | ICD-10-CM | POA: Diagnosis not present

## 2023-04-29 DIAGNOSIS — M6281 Muscle weakness (generalized): Secondary | ICD-10-CM | POA: Diagnosis not present

## 2023-05-02 DIAGNOSIS — R278 Other lack of coordination: Secondary | ICD-10-CM | POA: Diagnosis not present

## 2023-05-02 DIAGNOSIS — M6281 Muscle weakness (generalized): Secondary | ICD-10-CM | POA: Diagnosis not present

## 2023-05-03 DIAGNOSIS — R2681 Unsteadiness on feet: Secondary | ICD-10-CM | POA: Diagnosis not present

## 2023-05-06 DIAGNOSIS — R278 Other lack of coordination: Secondary | ICD-10-CM | POA: Diagnosis not present

## 2023-05-06 DIAGNOSIS — M6281 Muscle weakness (generalized): Secondary | ICD-10-CM | POA: Diagnosis not present

## 2023-05-09 DIAGNOSIS — R278 Other lack of coordination: Secondary | ICD-10-CM | POA: Diagnosis not present

## 2023-05-09 DIAGNOSIS — M6281 Muscle weakness (generalized): Secondary | ICD-10-CM | POA: Diagnosis not present

## 2023-05-10 DIAGNOSIS — R2681 Unsteadiness on feet: Secondary | ICD-10-CM | POA: Diagnosis not present

## 2023-05-11 ENCOUNTER — Encounter: Payer: Self-pay | Admitting: Podiatry

## 2023-05-11 ENCOUNTER — Ambulatory Visit (INDEPENDENT_AMBULATORY_CARE_PROVIDER_SITE_OTHER): Payer: Medicare Other | Admitting: Podiatry

## 2023-05-11 DIAGNOSIS — M79674 Pain in right toe(s): Secondary | ICD-10-CM

## 2023-05-11 DIAGNOSIS — M79675 Pain in left toe(s): Secondary | ICD-10-CM | POA: Diagnosis not present

## 2023-05-11 DIAGNOSIS — B351 Tinea unguium: Secondary | ICD-10-CM

## 2023-05-11 NOTE — Progress Notes (Signed)
   Chief Complaint  Patient presents with   Routine foot care    Toe nail trim. Bilateral big toenail issues from dropping things on them    SUBJECTIVE Patient presents to office today complaining of elongated, thickened nails that cause pain while ambulating in shoes.  Patient is unable to trim their own nails. Patient is here for further evaluation and treatment.  Past Medical History:  Diagnosis Date   Acid reflux    Hyperlipidemia    Hypertension    PAD (peripheral artery disease) (HCC)    Renal artery occlusion (HCC)    1-59% left renal artery stenosis by 08/17/16 [place]; known right renal artery occlusion since at least 09/20/00 by angiogram   Skin cancer of nose    Squamous cell cancer of skin of right shoulder     Allergies  Allergen Reactions   Codeine Nausea Only   Sulfa Antibiotics Itching and Nausea Only     OBJECTIVE General Patient is awake, alert, and oriented x 3 and in no acute distress. Derm Skin is dry and supple bilateral. Negative open lesions or macerations. Remaining integument unremarkable. Nails are tender, long, thickened and dystrophic with subungual debris, consistent with onychomycosis, 1-5 bilateral. No signs of infection noted. Vasc  DP and PT pedal pulses palpable bilaterally. Temperature gradient within normal limits.  Neuro Epicritic and protective threshold sensation grossly intact bilaterally.  Musculoskeletal Exam No symptomatic pedal deformities noted bilateral. Muscular strength within normal limits.  ASSESSMENT 1.  Pain due to onychomycosis of toenails both  PLAN OF CARE 1. Patient evaluated today.  2. Instructed to maintain good pedal hygiene and foot care.  3. Mechanical debridement of nails 1-5 bilaterally performed using a nail nipper. Filed with dremel without incident.  4. Return to clinic in 3 mos.    Felecia Shelling, DPM [facility]  Dr. Felecia Shelling, DPM    [address] [place], [place] [address]                Office [phone]  Fax [phone]

## 2023-05-12 DIAGNOSIS — R2681 Unsteadiness on feet: Secondary | ICD-10-CM | POA: Diagnosis not present

## 2023-05-16 DIAGNOSIS — M6281 Muscle weakness (generalized): Secondary | ICD-10-CM | POA: Diagnosis not present

## 2023-05-16 DIAGNOSIS — R278 Other lack of coordination: Secondary | ICD-10-CM | POA: Diagnosis not present

## 2023-05-17 DIAGNOSIS — R2681 Unsteadiness on feet: Secondary | ICD-10-CM | POA: Diagnosis not present

## 2023-05-19 DIAGNOSIS — R2681 Unsteadiness on feet: Secondary | ICD-10-CM | POA: Diagnosis not present

## 2023-05-20 DIAGNOSIS — R278 Other lack of coordination: Secondary | ICD-10-CM | POA: Diagnosis not present

## 2023-05-20 DIAGNOSIS — M6281 Muscle weakness (generalized): Secondary | ICD-10-CM | POA: Diagnosis not present

## 2023-05-23 DIAGNOSIS — R278 Other lack of coordination: Secondary | ICD-10-CM | POA: Diagnosis not present

## 2023-05-23 DIAGNOSIS — M6281 Muscle weakness (generalized): Secondary | ICD-10-CM | POA: Diagnosis not present

## 2023-05-24 DIAGNOSIS — R2681 Unsteadiness on feet: Secondary | ICD-10-CM | POA: Diagnosis not present

## 2023-05-27 DIAGNOSIS — R2681 Unsteadiness on feet: Secondary | ICD-10-CM | POA: Diagnosis not present

## 2023-05-30 DIAGNOSIS — M6281 Muscle weakness (generalized): Secondary | ICD-10-CM | POA: Diagnosis not present

## 2023-05-30 DIAGNOSIS — R278 Other lack of coordination: Secondary | ICD-10-CM | POA: Diagnosis not present

## 2023-05-31 DIAGNOSIS — R2681 Unsteadiness on feet: Secondary | ICD-10-CM | POA: Diagnosis not present

## 2023-06-01 DIAGNOSIS — M6281 Muscle weakness (generalized): Secondary | ICD-10-CM | POA: Diagnosis not present

## 2023-06-01 DIAGNOSIS — R278 Other lack of coordination: Secondary | ICD-10-CM | POA: Diagnosis not present

## 2023-06-02 DIAGNOSIS — R2681 Unsteadiness on feet: Secondary | ICD-10-CM | POA: Diagnosis not present

## 2023-06-06 DIAGNOSIS — M6281 Muscle weakness (generalized): Secondary | ICD-10-CM | POA: Diagnosis not present

## 2023-06-06 DIAGNOSIS — R278 Other lack of coordination: Secondary | ICD-10-CM | POA: Diagnosis not present

## 2023-06-07 DIAGNOSIS — R2681 Unsteadiness on feet: Secondary | ICD-10-CM | POA: Diagnosis not present

## 2023-06-08 DIAGNOSIS — R278 Other lack of coordination: Secondary | ICD-10-CM | POA: Diagnosis not present

## 2023-06-08 DIAGNOSIS — E538 Deficiency of other specified B group vitamins: Secondary | ICD-10-CM | POA: Diagnosis not present

## 2023-06-08 DIAGNOSIS — M6281 Muscle weakness (generalized): Secondary | ICD-10-CM | POA: Diagnosis not present

## 2023-06-09 DIAGNOSIS — R2681 Unsteadiness on feet: Secondary | ICD-10-CM | POA: Diagnosis not present

## 2023-06-13 DIAGNOSIS — R278 Other lack of coordination: Secondary | ICD-10-CM | POA: Diagnosis not present

## 2023-06-13 DIAGNOSIS — M6281 Muscle weakness (generalized): Secondary | ICD-10-CM | POA: Diagnosis not present

## 2023-06-14 DIAGNOSIS — R2681 Unsteadiness on feet: Secondary | ICD-10-CM | POA: Diagnosis not present

## 2023-06-16 DIAGNOSIS — R278 Other lack of coordination: Secondary | ICD-10-CM | POA: Diagnosis not present

## 2023-06-16 DIAGNOSIS — M6281 Muscle weakness (generalized): Secondary | ICD-10-CM | POA: Diagnosis not present

## 2023-06-16 DIAGNOSIS — R2681 Unsteadiness on feet: Secondary | ICD-10-CM | POA: Diagnosis not present

## 2023-06-20 DIAGNOSIS — R278 Other lack of coordination: Secondary | ICD-10-CM | POA: Diagnosis not present

## 2023-06-20 DIAGNOSIS — M6281 Muscle weakness (generalized): Secondary | ICD-10-CM | POA: Diagnosis not present

## 2023-06-21 DIAGNOSIS — R2681 Unsteadiness on feet: Secondary | ICD-10-CM | POA: Diagnosis not present

## 2023-06-22 DIAGNOSIS — M6281 Muscle weakness (generalized): Secondary | ICD-10-CM | POA: Diagnosis not present

## 2023-06-22 DIAGNOSIS — R278 Other lack of coordination: Secondary | ICD-10-CM | POA: Diagnosis not present

## 2023-06-23 DIAGNOSIS — R2681 Unsteadiness on feet: Secondary | ICD-10-CM | POA: Diagnosis not present

## 2023-06-25 ENCOUNTER — Other Ambulatory Visit: Payer: Self-pay

## 2023-06-25 ENCOUNTER — Emergency Department (HOSPITAL_BASED_OUTPATIENT_CLINIC_OR_DEPARTMENT_OTHER): Admission: EM | Admit: 2023-06-25 | Discharge: 2023-06-25 | Disposition: A | Discharge status: Home / Self Care

## 2023-06-25 ENCOUNTER — Encounter (HOSPITAL_BASED_OUTPATIENT_CLINIC_OR_DEPARTMENT_OTHER): Payer: Self-pay | Admitting: Emergency Medicine

## 2023-06-25 DIAGNOSIS — N3000 Acute cystitis without hematuria: Secondary | ICD-10-CM | POA: Insufficient documentation

## 2023-06-25 DIAGNOSIS — Z79899 Other long term (current) drug therapy: Secondary | ICD-10-CM | POA: Diagnosis not present

## 2023-06-25 DIAGNOSIS — B029 Zoster without complications: Secondary | ICD-10-CM | POA: Insufficient documentation

## 2023-06-25 DIAGNOSIS — R21 Rash and other nonspecific skin eruption: Secondary | ICD-10-CM | POA: Diagnosis present

## 2023-06-25 DIAGNOSIS — I10 Essential (primary) hypertension: Secondary | ICD-10-CM | POA: Insufficient documentation

## 2023-06-25 HISTORY — DX: Personal history of other endocrine, nutritional and metabolic disease: Z86.39

## 2023-06-25 HISTORY — DX: Hypo-osmolality and hyponatremia: E87.1

## 2023-06-25 HISTORY — DX: Personal history of other infectious and parasitic diseases: Z86.19

## 2023-06-25 LAB — CBC WITH DIFFERENTIAL/PLATELET
Abs Immature Granulocytes: 0.01 10*3/uL (ref 0.00–0.07)
Basophils Absolute: 0 10*3/uL (ref 0.0–0.1)
Basophils Relative: 1 %
Eosinophils Absolute: 0.1 10*3/uL (ref 0.0–0.5)
Eosinophils Relative: 2 %
HCT: 34.2 % — ABNORMAL LOW (ref 36.0–46.0)
Hemoglobin: 11.5 g/dL — ABNORMAL LOW (ref 12.0–15.0)
Immature Granulocytes: 0 %
Lymphocytes Relative: 20 %
Lymphs Abs: 1.3 10*3/uL (ref 0.7–4.0)
MCH: 30.4 pg (ref 26.0–34.0)
MCHC: 33.6 g/dL (ref 30.0–36.0)
MCV: 90.5 fL (ref 80.0–100.0)
Monocytes Absolute: 0.8 10*3/uL (ref 0.1–1.0)
Monocytes Relative: 12 %
Neutro Abs: 4.5 10*3/uL (ref 1.7–7.7)
Neutrophils Relative %: 65 %
Platelets: 250 10*3/uL (ref 150–400)
RBC: 3.78 MIL/uL — ABNORMAL LOW (ref 3.87–5.11)
RDW: 14 % (ref 11.5–15.5)
WBC: 6.8 10*3/uL (ref 4.0–10.5)
nRBC: 0 % (ref 0.0–0.2)

## 2023-06-25 LAB — URINALYSIS, ROUTINE W REFLEX MICROSCOPIC
Bilirubin Urine: NEGATIVE
Glucose, UA: NEGATIVE mg/dL
Hgb urine dipstick: NEGATIVE
Ketones, ur: NEGATIVE mg/dL
Nitrite: NEGATIVE
Protein, ur: NEGATIVE mg/dL
Specific Gravity, Urine: 1.006 (ref 1.005–1.030)
WBC, UA: >50 WBC/hpf (ref 0–5)
pH: 6.5 (ref 5.0–8.0)

## 2023-06-25 LAB — BASIC METABOLIC PANEL WITH GFR
Anion gap: 8 (ref 5–15)
BUN: 24 mg/dL — ABNORMAL HIGH (ref 8–23)
CO2: 26 mmol/L (ref 22–32)
Calcium: 9.6 mg/dL (ref 8.9–10.3)
Chloride: 95 mmol/L — ABNORMAL LOW (ref 98–111)
Creatinine, Ser: 1.12 mg/dL — ABNORMAL HIGH (ref 0.44–1.00)
GFR, Estimated: 48 mL/min — ABNORMAL LOW (ref >60)
Glucose, Bld: 103 mg/dL — ABNORMAL HIGH (ref 70–99)
Potassium: 4.6 mmol/L (ref 3.5–5.1)
Sodium: 129 mmol/L — ABNORMAL LOW (ref 135–145)

## 2023-06-25 MED ORDER — VALACYCLOVIR HCL 500 MG PO TABS
500.0000 mg | ORAL_TABLET | Freq: Once | ORAL | Status: AC
  Administered 2023-06-25: 500 mg via ORAL
  Filled 2023-06-25: qty 1

## 2023-06-25 MED ORDER — CEPHALEXIN 250 MG PO CAPS
500.0000 mg | ORAL_CAPSULE | Freq: Once | ORAL | Status: AC
  Administered 2023-06-25: 500 mg via ORAL
  Filled 2023-06-25: qty 2

## 2023-06-25 MED ORDER — VALACYCLOVIR HCL 1 G PO TABS: 1000.0000 mg | ORAL_TABLET | Freq: Two times a day (BID) | ORAL | 0 refills | Status: AC

## 2023-06-25 MED ORDER — CEPHALEXIN 500 MG PO CAPS: 500.0000 mg | ORAL_CAPSULE | Freq: Two times a day (BID) | ORAL | 0 refills | Status: AC

## 2023-06-25 NOTE — Discharge Instructions (Signed)
 Please take the Valtrex as prescribed for shingles.  Your urinalysis did show findings consistent with possible infection.  Please take the oral antibiotic as well.  If you are feeling worse please go to either [facility] or [facility] for further evaluation.  Your sodium level was stable here today.

## 2023-06-25 NOTE — ED Provider Notes (Signed)
 [place] EMERGENCY DEPARTMENT AT [facility] Provider Note   CSN: [id number] Arrival date & time: 06/25/23  1333     History  No chief complaint on file.   Stacy Erickson is a 88 y.o. female.  88 year old female with past medical history of hypertension and chronic hyponatremia presenting to the emergency department today with concern for rash on her right arm.  The patient states that this is similar to the rash she had when she was diagnosed with shingles last year.  She had a protracted course during that time and went up being hospitalized for severe hyponatremia and UTI at the same time.  She states that she has been feeling well and denies any systemic symptoms but when she noticed the rash she wanted to come and get treated soon as possible of this were the case.  She denies any rashes elsewhere.        Home Medications Prior to Admission medications   Medication Sig Start Date End Date Taking? Authorizing Provider  cephALEXin (KEFLEX) 500 MG capsule Take 1 capsule (500 mg total) by mouth 2 (two) times daily. 06/25/23  Yes Carin Charleston, MD  valACYclovir (VALTREX) 1000 MG tablet Take 1 tablet (1,000 mg total) by mouth 2 (two) times daily. 06/25/23  Yes Carin Charleston, MD  acetaminophen (TYLENOL) 500 MG tablet Take 1,000 mg by mouth every 6 (six) hours as needed for mild pain.    [provider]  amLODipine (NORVASC) 2.5 MG tablet Take 1 tablet (2.5 mg total) by mouth daily. 11/27/22   Samtani, Jai-Gurmukh, MD  Cholecalciferol (VITAMIN D3) 1000 UNITS CAPS Take 3,000 Units by mouth daily.    [provider]  Cyanocobalamin (B-12 COMPLIANCE INJECTION) 1000 MCG/ML KIT 1 IM 1x a month Injection 01/13/17   [provider]  fluticasone (FLONASE) 50 MCG/ACT nasal spray Place 1 spray into both nostrils daily. 11/27/22   Samtani, Jai-Gurmukh, MD  gabapentin (NEURONTIN) 100 MG capsule Take 2 capsules (200 mg total) by mouth daily for 7 days. 11/26/22  12/03/22  Samtani, Jai-Gurmukh, MD  hydrALAZINE (APRESOLINE) 50 MG tablet Take 1 tablet (50 mg total) by mouth every 8 (eight) hours. 11/26/22   Samtani, Jai-Gurmukh, MD  Menthol, Topical Analgesic, (ASPERCREME MAX ROLL-ON EX) Apply 1 application topically every 6 (six) hours as needed (pain).    [provider]  mirabegron ER (MYRBETRIQ) 50 MG TB24 tablet Take 50 mg by mouth daily.    [provider]  Multiple Vitamin (MULTIVITAMIN) capsule Take 1 capsule by mouth daily.    [provider]  nebivolol (BYSTOLIC) 10 MG tablet Take 10 mg by mouth daily. Take at bedtime    [provider]  nicotine (NICODERM CQ - DOSED IN MG/24 HOURS) 14 mg/24hr patch Place 1 patch onto the skin daily. Patient not taking: Reported on 11/17/2022    [provider]  omeprazole (PRILOSEC) 20 MG capsule 1 capsule 30 minutes before morning meal Orally Once a day for 90 days    [provider]  rOPINIRole (REQUIP) 0.5 MG tablet Take 1 tablet (0.5 mg total) by mouth at bedtime. 11/26/22   Samtani, Jai-Gurmukh, MD  saline (AYR) GEL Place 1 Application into both nostrils every 4 (four) hours as needed (for dry nose). 11/26/22   Samtani, Jai-Gurmukh, MD  simvastatin (ZOCOR) 20 MG tablet 1-2 times weekly Monday and thursday    [provider]  sodium chloride 1 g tablet Take 1 tablet (1 g total) by mouth  2 (two) times daily with a meal. 11/26/22   Rhetta Mura, MD      Allergies    Codeine and Sulfa antibiotics    Review of Systems   Review of Systems  Skin:  Positive for rash.  All other systems reviewed and are negative.   Physical Exam Updated Vital Signs BP (!) 147/76 (BP Location: Right Arm)   Pulse 68   Temp 98 F (36.7 C)   Resp 18   Wt 62.6 kg   SpO2 99%   BMI 26.95 kg/m  Physical Exam Vitals and nursing note reviewed.   Gen: NAD Eyes: PERRL, EOMI HEENT: no oropharyngeal swelling Neck: trachea midline Resp: clear to auscultation  bilaterally Card: RRR, no murmurs, rubs, or gallops Abd: nontender, nondistended Extremities: no calf tenderness, no edema Vascular: 2+ radial pulses bilaterally, 2+ DP pulses bilaterally Skin: There is a linear rash over the patient's lateral forearm with erythematous base.  It has appeared there may be 1 vesicle with no oozing.  There is does not cross dermatomal lines Psyc: acting appropriately   ED Results / Procedures / Treatments   Labs (all labs ordered are listed, but only abnormal results are displayed) Labs Reviewed  CBC WITH DIFFERENTIAL/PLATELET - Abnormal; Notable for the following components:      Result Value   RBC 3.78 (*)    Hemoglobin 11.5 (*)    HCT 34.2 (*)    All other components within normal limits  BASIC METABOLIC PANEL WITH GFR - Abnormal; Notable for the following components:   Sodium 129 (*)    Chloride 95 (*)    Glucose, Bld 103 (*)    BUN 24 (*)    Creatinine, Ser 1.12 (*)    GFR, Estimated 48 (*)    All other components within normal limits  URINALYSIS, ROUTINE W REFLEX MICROSCOPIC - Abnormal; Notable for the following components:   Leukocytes,Ua LARGE (*)    Bacteria, UA MANY (*)    All other components within normal limits    EKG None  Radiology No results found.  Procedures Procedures    Medications Ordered in ED Medications  valACYclovir (VALTREX) tablet 500 mg (has no administration in time range)  cephALEXin (KEFLEX) capsule 500 mg (has no administration in time range)    ED Course/ Medical Decision Making/ A&P                                 Medical Decision Making 88 year old female with past medical history of hypertension and chronic hyponatremia presenting to the emergency department today with rash that certainly could be consistent with shingles rash.  This does not blanch so we will obtain labs to evaluate for thrombocytopenia here as well.  Also obtain labs to evaluate for hyponatremia and her renal function to  determine dosing for possible antivirals.  Given her history of UTI in the past with exacerbation of shingles will obtain a urinalysis as well as she has provided [place] a sample.  I will reevaluate for ultimate disposition.  I think that if her workup is reassuring that she may be discharged on oral medications for shingles.  The patient's labs here are relatively stable.  Urinalysis does have many bacteria with leukocyte esterase and pyuria.  The patient is given Keflex here.  I will treat her with renally dosed Valtrex and Keflex.  Think she is stable for discharge.  Amount and/or Complexity of Data  Reviewed Labs: ordered.  Risk Prescription drug management.           Final Clinical Impression(s) / ED Diagnoses Final diagnoses:  Herpes zoster without complication  Acute cystitis without hematuria    Rx / DC Orders ED Discharge Orders          Ordered    cephALEXin (KEFLEX) 500 MG capsule  2 times daily        06/25/23 1640    valACYclovir (VALTREX) 1000 MG tablet  2 times daily        06/25/23 1640              Carin Charleston, MD 06/25/23 [id number]

## 2023-06-25 NOTE — ED Triage Notes (Signed)
 Pt has hx of hyponatremia.  Last August she had shingles on her arm and ended up with SIADH.   Today she has a similar rash on her right arm and wanted to be seen and make sure not shingles

## 2023-06-27 DIAGNOSIS — M6281 Muscle weakness (generalized): Secondary | ICD-10-CM | POA: Diagnosis not present

## 2023-06-27 DIAGNOSIS — R278 Other lack of coordination: Secondary | ICD-10-CM | POA: Diagnosis not present

## 2023-06-29 DIAGNOSIS — M6281 Muscle weakness (generalized): Secondary | ICD-10-CM | POA: Diagnosis not present

## 2023-06-29 DIAGNOSIS — R278 Other lack of coordination: Secondary | ICD-10-CM | POA: Diagnosis not present

## 2023-06-30 DIAGNOSIS — R2681 Unsteadiness on feet: Secondary | ICD-10-CM | POA: Diagnosis not present

## 2023-07-04 DIAGNOSIS — M6281 Muscle weakness (generalized): Secondary | ICD-10-CM | POA: Diagnosis not present

## 2023-07-04 DIAGNOSIS — R278 Other lack of coordination: Secondary | ICD-10-CM | POA: Diagnosis not present

## 2023-07-05 DIAGNOSIS — R2681 Unsteadiness on feet: Secondary | ICD-10-CM | POA: Diagnosis not present

## 2023-07-06 DIAGNOSIS — R278 Other lack of coordination: Secondary | ICD-10-CM | POA: Diagnosis not present

## 2023-07-06 DIAGNOSIS — M6281 Muscle weakness (generalized): Secondary | ICD-10-CM | POA: Diagnosis not present

## 2023-07-07 DIAGNOSIS — R2681 Unsteadiness on feet: Secondary | ICD-10-CM | POA: Diagnosis not present

## 2023-07-11 DIAGNOSIS — R278 Other lack of coordination: Secondary | ICD-10-CM | POA: Diagnosis not present

## 2023-07-11 DIAGNOSIS — M6281 Muscle weakness (generalized): Secondary | ICD-10-CM | POA: Diagnosis not present

## 2023-07-12 DIAGNOSIS — R2681 Unsteadiness on feet: Secondary | ICD-10-CM | POA: Diagnosis not present

## 2023-07-13 DIAGNOSIS — M6281 Muscle weakness (generalized): Secondary | ICD-10-CM | POA: Diagnosis not present

## 2023-07-13 DIAGNOSIS — R278 Other lack of coordination: Secondary | ICD-10-CM | POA: Diagnosis not present

## 2023-07-14 DIAGNOSIS — R2681 Unsteadiness on feet: Secondary | ICD-10-CM | POA: Diagnosis not present

## 2023-07-19 DIAGNOSIS — R2681 Unsteadiness on feet: Secondary | ICD-10-CM | POA: Diagnosis not present

## 2023-07-21 DIAGNOSIS — R2681 Unsteadiness on feet: Secondary | ICD-10-CM | POA: Diagnosis not present

## 2023-07-23 DIAGNOSIS — M6281 Muscle weakness (generalized): Secondary | ICD-10-CM | POA: Diagnosis not present

## 2023-07-23 DIAGNOSIS — R278 Other lack of coordination: Secondary | ICD-10-CM | POA: Diagnosis not present

## 2023-07-26 DIAGNOSIS — R2681 Unsteadiness on feet: Secondary | ICD-10-CM | POA: Diagnosis not present

## 2023-07-27 DIAGNOSIS — E785 Hyperlipidemia, unspecified: Secondary | ICD-10-CM | POA: Diagnosis not present

## 2023-07-27 DIAGNOSIS — M8580 Other specified disorders of bone density and structure, unspecified site: Secondary | ICD-10-CM | POA: Diagnosis not present

## 2023-07-27 DIAGNOSIS — D509 Iron deficiency anemia, unspecified: Secondary | ICD-10-CM | POA: Diagnosis not present

## 2023-07-27 DIAGNOSIS — I10 Essential (primary) hypertension: Secondary | ICD-10-CM | POA: Diagnosis not present

## 2023-07-27 DIAGNOSIS — E538 Deficiency of other specified B group vitamins: Secondary | ICD-10-CM | POA: Diagnosis not present

## 2023-07-27 DIAGNOSIS — D649 Anemia, unspecified: Secondary | ICD-10-CM | POA: Diagnosis not present

## 2023-07-28 DIAGNOSIS — R2681 Unsteadiness on feet: Secondary | ICD-10-CM | POA: Diagnosis not present

## 2023-07-30 DIAGNOSIS — R278 Other lack of coordination: Secondary | ICD-10-CM | POA: Diagnosis not present

## 2023-07-30 DIAGNOSIS — M6281 Muscle weakness (generalized): Secondary | ICD-10-CM | POA: Diagnosis not present

## 2023-08-02 DIAGNOSIS — R2681 Unsteadiness on feet: Secondary | ICD-10-CM | POA: Diagnosis not present

## 2023-08-03 DIAGNOSIS — M1990 Unspecified osteoarthritis, unspecified site: Secondary | ICD-10-CM | POA: Diagnosis not present

## 2023-08-03 DIAGNOSIS — G2581 Restless legs syndrome: Secondary | ICD-10-CM | POA: Diagnosis not present

## 2023-08-03 DIAGNOSIS — I10 Essential (primary) hypertension: Secondary | ICD-10-CM | POA: Diagnosis not present

## 2023-08-03 DIAGNOSIS — Z1339 Encounter for screening examination for other mental health and behavioral disorders: Secondary | ICD-10-CM | POA: Diagnosis not present

## 2023-08-03 DIAGNOSIS — J449 Chronic obstructive pulmonary disease, unspecified: Secondary | ICD-10-CM | POA: Diagnosis not present

## 2023-08-03 DIAGNOSIS — I440 Atrioventricular block, first degree: Secondary | ICD-10-CM | POA: Diagnosis not present

## 2023-08-03 DIAGNOSIS — D692 Other nonthrombocytopenic purpura: Secondary | ICD-10-CM | POA: Diagnosis not present

## 2023-08-03 DIAGNOSIS — R82998 Other abnormal findings in urine: Secondary | ICD-10-CM | POA: Diagnosis not present

## 2023-08-03 DIAGNOSIS — M8580 Other specified disorders of bone density and structure, unspecified site: Secondary | ICD-10-CM | POA: Diagnosis not present

## 2023-08-03 DIAGNOSIS — Z0000 Encounter for general adult medical examination without abnormal findings: Secondary | ICD-10-CM | POA: Diagnosis not present

## 2023-08-03 DIAGNOSIS — R2689 Other abnormalities of gait and mobility: Secondary | ICD-10-CM | POA: Diagnosis not present

## 2023-08-03 DIAGNOSIS — Z1331 Encounter for screening for depression: Secondary | ICD-10-CM | POA: Diagnosis not present

## 2023-08-03 DIAGNOSIS — E538 Deficiency of other specified B group vitamins: Secondary | ICD-10-CM | POA: Diagnosis not present

## 2023-08-04 DIAGNOSIS — R2681 Unsteadiness on feet: Secondary | ICD-10-CM | POA: Diagnosis not present

## 2023-08-06 DIAGNOSIS — M6281 Muscle weakness (generalized): Secondary | ICD-10-CM | POA: Diagnosis not present

## 2023-08-06 DIAGNOSIS — R278 Other lack of coordination: Secondary | ICD-10-CM | POA: Diagnosis not present

## 2023-08-09 DIAGNOSIS — R2681 Unsteadiness on feet: Secondary | ICD-10-CM | POA: Diagnosis not present

## 2023-08-11 DIAGNOSIS — R2681 Unsteadiness on feet: Secondary | ICD-10-CM | POA: Diagnosis not present

## 2023-08-13 DIAGNOSIS — R278 Other lack of coordination: Secondary | ICD-10-CM | POA: Diagnosis not present

## 2023-08-13 DIAGNOSIS — M6281 Muscle weakness (generalized): Secondary | ICD-10-CM | POA: Diagnosis not present

## 2023-08-16 DIAGNOSIS — R2681 Unsteadiness on feet: Secondary | ICD-10-CM | POA: Diagnosis not present

## 2023-08-18 DIAGNOSIS — R2681 Unsteadiness on feet: Secondary | ICD-10-CM | POA: Diagnosis not present

## 2023-08-20 DIAGNOSIS — M6281 Muscle weakness (generalized): Secondary | ICD-10-CM | POA: Diagnosis not present

## 2023-08-20 DIAGNOSIS — R278 Other lack of coordination: Secondary | ICD-10-CM | POA: Diagnosis not present

## 2023-08-23 DIAGNOSIS — R2681 Unsteadiness on feet: Secondary | ICD-10-CM | POA: Diagnosis not present

## 2023-08-27 DIAGNOSIS — M6281 Muscle weakness (generalized): Secondary | ICD-10-CM | POA: Diagnosis not present

## 2023-08-27 DIAGNOSIS — R278 Other lack of coordination: Secondary | ICD-10-CM | POA: Diagnosis not present

## 2023-08-30 DIAGNOSIS — R2681 Unsteadiness on feet: Secondary | ICD-10-CM | POA: Diagnosis not present

## 2023-09-01 DIAGNOSIS — R2681 Unsteadiness on feet: Secondary | ICD-10-CM | POA: Diagnosis not present

## 2023-09-03 DIAGNOSIS — R278 Other lack of coordination: Secondary | ICD-10-CM | POA: Diagnosis not present

## 2023-09-03 DIAGNOSIS — M6281 Muscle weakness (generalized): Secondary | ICD-10-CM | POA: Diagnosis not present

## 2023-09-06 DIAGNOSIS — R2681 Unsteadiness on feet: Secondary | ICD-10-CM | POA: Diagnosis not present

## 2023-09-07 ENCOUNTER — Encounter: Payer: Self-pay | Admitting: Podiatry

## 2023-09-07 ENCOUNTER — Ambulatory Visit (INDEPENDENT_AMBULATORY_CARE_PROVIDER_SITE_OTHER): Admitting: Podiatry

## 2023-09-07 VITALS — Ht 60.0 in | Wt 138.0 lb

## 2023-09-07 DIAGNOSIS — M2011 Hallux valgus (acquired), right foot: Secondary | ICD-10-CM

## 2023-09-07 NOTE — Progress Notes (Signed)
   Chief Complaint  Patient presents with   Toe Pain    Pt is here due to bilateral toe pain pt states both 2nd toes are pressing against the great toes and causing some redness and pain the right foot has a corn/callous in between the toes she thinks one is starting to form on the left as well.    HPI: 88 y.o. female presenting today with her son for evaluation of pain and tenderness to the interdigital area of the right foot between the great toe and second adjacent digit.  Past Medical History:  Diagnosis Date   Acid reflux    History of shingles    History of SIADH    Hyperlipidemia    Hypertension    Hyponatremia    PAD (peripheral artery disease) (HCC)    Renal artery occlusion (HCC)    1-59% left renal artery stenosis by 08/17/16 US ; known right renal artery occlusion since at least 09/20/00 by angiogram   Skin cancer of nose    Squamous cell cancer of skin of right shoulder     Past Surgical History:  Procedure Laterality Date   ABDOMINAL HYSTERECTOMY     CATARACT EXTRACTION, BILATERAL  2015   GANGLION CYST EXCISION     INSERTION OF MESH N/A 03/19/2020   Procedure: INSERTION OF MESH;  Surgeon: Belinda Cough, MD;  Location: [facility] OR;  Service: General;  Laterality: N/A;   JOINT REPLACEMENT     hip   PR VEIN BYPASS GRAFT,AORTO-FEM-POP  2005   Left Fem=pop graft by Dr. gerlean PARKER HERNIA REPAIR N/A 03/19/2020   Procedure: OPEN VENTRAL HERNIA REPAIR WITH MESH;  Surgeon: Belinda Cough, MD;  Location: [facility] OR;  Service: General;  Laterality: N/A;    Allergies  Allergen Reactions   Codeine Nausea Only   Sulfa Antibiotics Itching and Nausea Only     Physical Exam: General: The patient is alert and oriented x3 in no acute distress.  Dermatology: Skin is warm, dry and supple bilateral lower extremities.  Hyperkeratotic callus noted to the great toe and second digit with associated tenderness  Vascular: Palpable pedal pulses bilaterally. Capillary refill within normal limits.   No appreciable edema.  No erythema.  Neurological: Grossly intact via light touch  Musculoskeletal Exam: Valgus deformity noted with lateral deviation of the hallux pressing against the adjacent  Assessment/Plan of Care: 1.  Hallux valgus deformity right foot 2.  Symptomatic callus between the 1st and 2nd digit right foot  -Patient evaluated -Light debridement of the callus tissue was performed today to the second digit and hallux -Recommend silicone toe spacer or padding to the interdigital area to alleviate pressure between the second digit and great toe -Recommend wide fitting shoes that do not constrict the toebox area -Return to clinic PRN       Thresa EMERSON Sar, DPM [facility]  Dr. Thresa EMERSON Sar, DPM    [address] [place], [place] [address]                Office [phone]  Fax [phone]

## 2023-09-08 DIAGNOSIS — R2681 Unsteadiness on feet: Secondary | ICD-10-CM | POA: Diagnosis not present

## 2023-09-10 DIAGNOSIS — M6281 Muscle weakness (generalized): Secondary | ICD-10-CM | POA: Diagnosis not present

## 2023-09-10 DIAGNOSIS — R278 Other lack of coordination: Secondary | ICD-10-CM | POA: Diagnosis not present

## 2023-09-13 DIAGNOSIS — R2681 Unsteadiness on feet: Secondary | ICD-10-CM | POA: Diagnosis not present

## 2023-09-17 DIAGNOSIS — R278 Other lack of coordination: Secondary | ICD-10-CM | POA: Diagnosis not present

## 2023-09-17 DIAGNOSIS — M6281 Muscle weakness (generalized): Secondary | ICD-10-CM | POA: Diagnosis not present

## 2023-09-20 DIAGNOSIS — R2681 Unsteadiness on feet: Secondary | ICD-10-CM | POA: Diagnosis not present

## 2023-09-24 DIAGNOSIS — R278 Other lack of coordination: Secondary | ICD-10-CM | POA: Diagnosis not present

## 2023-09-24 DIAGNOSIS — M6281 Muscle weakness (generalized): Secondary | ICD-10-CM | POA: Diagnosis not present

## 2023-09-27 DIAGNOSIS — R2681 Unsteadiness on feet: Secondary | ICD-10-CM | POA: Diagnosis not present

## 2023-09-29 DIAGNOSIS — R2681 Unsteadiness on feet: Secondary | ICD-10-CM | POA: Diagnosis not present

## 2023-10-01 DIAGNOSIS — R278 Other lack of coordination: Secondary | ICD-10-CM | POA: Diagnosis not present

## 2023-10-01 DIAGNOSIS — M6281 Muscle weakness (generalized): Secondary | ICD-10-CM | POA: Diagnosis not present

## 2023-10-04 DIAGNOSIS — G5603 Carpal tunnel syndrome, bilateral upper limbs: Secondary | ICD-10-CM | POA: Diagnosis not present

## 2023-10-04 DIAGNOSIS — R2681 Unsteadiness on feet: Secondary | ICD-10-CM | POA: Diagnosis not present

## 2023-10-06 DIAGNOSIS — R2681 Unsteadiness on feet: Secondary | ICD-10-CM | POA: Diagnosis not present

## 2023-10-08 DIAGNOSIS — M6281 Muscle weakness (generalized): Secondary | ICD-10-CM | POA: Diagnosis not present

## 2023-10-08 DIAGNOSIS — R278 Other lack of coordination: Secondary | ICD-10-CM | POA: Diagnosis not present

## 2023-10-11 DIAGNOSIS — R2681 Unsteadiness on feet: Secondary | ICD-10-CM | POA: Diagnosis not present

## 2023-10-13 DIAGNOSIS — R2681 Unsteadiness on feet: Secondary | ICD-10-CM | POA: Diagnosis not present

## 2023-10-15 DIAGNOSIS — M6281 Muscle weakness (generalized): Secondary | ICD-10-CM | POA: Diagnosis not present

## 2023-10-15 DIAGNOSIS — R278 Other lack of coordination: Secondary | ICD-10-CM | POA: Diagnosis not present

## 2023-10-20 DIAGNOSIS — R2681 Unsteadiness on feet: Secondary | ICD-10-CM | POA: Diagnosis not present

## 2023-10-25 DIAGNOSIS — R2681 Unsteadiness on feet: Secondary | ICD-10-CM | POA: Diagnosis not present

## 2023-10-27 DIAGNOSIS — R2681 Unsteadiness on feet: Secondary | ICD-10-CM | POA: Diagnosis not present

## 2023-10-29 DIAGNOSIS — M6281 Muscle weakness (generalized): Secondary | ICD-10-CM | POA: Diagnosis not present

## 2023-10-29 DIAGNOSIS — R278 Other lack of coordination: Secondary | ICD-10-CM | POA: Diagnosis not present

## 2023-11-01 DIAGNOSIS — M8589 Other specified disorders of bone density and structure, multiple sites: Secondary | ICD-10-CM | POA: Diagnosis not present

## 2023-11-01 DIAGNOSIS — E538 Deficiency of other specified B group vitamins: Secondary | ICD-10-CM | POA: Diagnosis not present

## 2023-11-03 DIAGNOSIS — R2681 Unsteadiness on feet: Secondary | ICD-10-CM | POA: Diagnosis not present

## 2023-11-08 DIAGNOSIS — R2681 Unsteadiness on feet: Secondary | ICD-10-CM | POA: Diagnosis not present

## 2023-11-10 DIAGNOSIS — R2681 Unsteadiness on feet: Secondary | ICD-10-CM | POA: Diagnosis not present

## 2023-11-12 DIAGNOSIS — R278 Other lack of coordination: Secondary | ICD-10-CM | POA: Diagnosis not present

## 2023-11-12 DIAGNOSIS — M6281 Muscle weakness (generalized): Secondary | ICD-10-CM | POA: Diagnosis not present

## 2023-11-15 DIAGNOSIS — R2681 Unsteadiness on feet: Secondary | ICD-10-CM | POA: Diagnosis not present

## 2023-11-17 DIAGNOSIS — R2681 Unsteadiness on feet: Secondary | ICD-10-CM | POA: Diagnosis not present

## 2023-11-19 DIAGNOSIS — R278 Other lack of coordination: Secondary | ICD-10-CM | POA: Diagnosis not present

## 2023-11-19 DIAGNOSIS — M6281 Muscle weakness (generalized): Secondary | ICD-10-CM | POA: Diagnosis not present

## 2023-11-22 DIAGNOSIS — R2681 Unsteadiness on feet: Secondary | ICD-10-CM | POA: Diagnosis not present

## 2023-11-24 DIAGNOSIS — R2681 Unsteadiness on feet: Secondary | ICD-10-CM | POA: Diagnosis not present

## 2023-11-26 DIAGNOSIS — R278 Other lack of coordination: Secondary | ICD-10-CM | POA: Diagnosis not present

## 2023-11-26 DIAGNOSIS — M6281 Muscle weakness (generalized): Secondary | ICD-10-CM | POA: Diagnosis not present

## 2023-11-29 DIAGNOSIS — R2681 Unsteadiness on feet: Secondary | ICD-10-CM | POA: Diagnosis not present

## 2023-12-01 DIAGNOSIS — R2681 Unsteadiness on feet: Secondary | ICD-10-CM | POA: Diagnosis not present

## 2023-12-03 DIAGNOSIS — R278 Other lack of coordination: Secondary | ICD-10-CM | POA: Diagnosis not present

## 2023-12-03 DIAGNOSIS — M6281 Muscle weakness (generalized): Secondary | ICD-10-CM | POA: Diagnosis not present

## 2023-12-06 DIAGNOSIS — R2681 Unsteadiness on feet: Secondary | ICD-10-CM | POA: Diagnosis not present

## 2023-12-07 DIAGNOSIS — Z23 Encounter for immunization: Secondary | ICD-10-CM | POA: Diagnosis not present

## 2023-12-08 DIAGNOSIS — R2681 Unsteadiness on feet: Secondary | ICD-10-CM | POA: Diagnosis not present

## 2023-12-13 DIAGNOSIS — R2681 Unsteadiness on feet: Secondary | ICD-10-CM | POA: Diagnosis not present

## 2023-12-15 DIAGNOSIS — R2681 Unsteadiness on feet: Secondary | ICD-10-CM | POA: Diagnosis not present

## 2023-12-17 DIAGNOSIS — M6281 Muscle weakness (generalized): Secondary | ICD-10-CM | POA: Diagnosis not present

## 2023-12-17 DIAGNOSIS — R278 Other lack of coordination: Secondary | ICD-10-CM | POA: Diagnosis not present

## 2023-12-20 DIAGNOSIS — R2681 Unsteadiness on feet: Secondary | ICD-10-CM | POA: Diagnosis not present

## 2023-12-22 DIAGNOSIS — R2681 Unsteadiness on feet: Secondary | ICD-10-CM | POA: Diagnosis not present

## 2023-12-24 DIAGNOSIS — R278 Other lack of coordination: Secondary | ICD-10-CM | POA: Diagnosis not present

## 2023-12-24 DIAGNOSIS — M6281 Muscle weakness (generalized): Secondary | ICD-10-CM | POA: Diagnosis not present

## 2023-12-27 DIAGNOSIS — R2681 Unsteadiness on feet: Secondary | ICD-10-CM | POA: Diagnosis not present

## 2024-01-02 DIAGNOSIS — M6281 Muscle weakness (generalized): Secondary | ICD-10-CM | POA: Diagnosis not present

## 2024-01-02 DIAGNOSIS — R278 Other lack of coordination: Secondary | ICD-10-CM | POA: Diagnosis not present

## 2024-01-04 DIAGNOSIS — R278 Other lack of coordination: Secondary | ICD-10-CM | POA: Diagnosis not present

## 2024-01-04 DIAGNOSIS — M1711 Unilateral primary osteoarthritis, right knee: Secondary | ICD-10-CM | POA: Diagnosis not present

## 2024-01-04 DIAGNOSIS — M6281 Muscle weakness (generalized): Secondary | ICD-10-CM | POA: Diagnosis not present

## 2024-01-11 DIAGNOSIS — M6281 Muscle weakness (generalized): Secondary | ICD-10-CM | POA: Diagnosis not present

## 2024-01-11 DIAGNOSIS — R278 Other lack of coordination: Secondary | ICD-10-CM | POA: Diagnosis not present

## 2024-01-18 DIAGNOSIS — M6281 Muscle weakness (generalized): Secondary | ICD-10-CM | POA: Diagnosis not present

## 2024-01-18 DIAGNOSIS — R278 Other lack of coordination: Secondary | ICD-10-CM | POA: Diagnosis not present

## 2024-01-30 DIAGNOSIS — N390 Urinary tract infection, site not specified: Secondary | ICD-10-CM | POA: Diagnosis not present

## 2024-02-07 DIAGNOSIS — R2681 Unsteadiness on feet: Secondary | ICD-10-CM | POA: Diagnosis not present

## 2024-02-13 DIAGNOSIS — M6281 Muscle weakness (generalized): Secondary | ICD-10-CM | POA: Diagnosis not present

## 2024-02-13 DIAGNOSIS — R278 Other lack of coordination: Secondary | ICD-10-CM | POA: Diagnosis not present

## 2024-02-14 DIAGNOSIS — R2681 Unsteadiness on feet: Secondary | ICD-10-CM | POA: Diagnosis not present

## 2024-02-16 DIAGNOSIS — R2681 Unsteadiness on feet: Secondary | ICD-10-CM | POA: Diagnosis not present

## 2024-02-21 DIAGNOSIS — R2681 Unsteadiness on feet: Secondary | ICD-10-CM | POA: Diagnosis not present

## 2024-02-23 DIAGNOSIS — H43813 Vitreous degeneration, bilateral: Secondary | ICD-10-CM | POA: Diagnosis not present

## 2024-02-23 DIAGNOSIS — R2681 Unsteadiness on feet: Secondary | ICD-10-CM | POA: Diagnosis not present

## 2024-02-23 DIAGNOSIS — H18513 Endothelial corneal dystrophy, bilateral: Secondary | ICD-10-CM | POA: Diagnosis not present

## 2024-02-23 DIAGNOSIS — Z961 Presence of intraocular lens: Secondary | ICD-10-CM | POA: Diagnosis not present

## 2024-02-23 DIAGNOSIS — Q141 Congenital malformation of retina: Secondary | ICD-10-CM | POA: Diagnosis not present

## 2024-02-28 DIAGNOSIS — R2681 Unsteadiness on feet: Secondary | ICD-10-CM | POA: Diagnosis not present

## 2024-02-29 ENCOUNTER — Emergency Department (HOSPITAL_BASED_OUTPATIENT_CLINIC_OR_DEPARTMENT_OTHER): Admitting: Radiology

## 2024-02-29 ENCOUNTER — Emergency Department (HOSPITAL_BASED_OUTPATIENT_CLINIC_OR_DEPARTMENT_OTHER)
Admission: EM | Admit: 2024-02-29 | Discharge: 2024-02-29 | Disposition: A | Discharge status: Home / Self Care | Attending: Emergency Medicine | Admitting: Emergency Medicine

## 2024-02-29 ENCOUNTER — Encounter (HOSPITAL_BASED_OUTPATIENT_CLINIC_OR_DEPARTMENT_OTHER): Payer: Self-pay

## 2024-02-29 ENCOUNTER — Other Ambulatory Visit (HOSPITAL_BASED_OUTPATIENT_CLINIC_OR_DEPARTMENT_OTHER): Payer: Self-pay

## 2024-02-29 DIAGNOSIS — R2681 Unsteadiness on feet: Secondary | ICD-10-CM | POA: Diagnosis not present

## 2024-02-29 DIAGNOSIS — U071 COVID-19: Secondary | ICD-10-CM | POA: Insufficient documentation

## 2024-02-29 DIAGNOSIS — R059 Cough, unspecified: Secondary | ICD-10-CM | POA: Diagnosis present

## 2024-02-29 DIAGNOSIS — Z79899 Other long term (current) drug therapy: Secondary | ICD-10-CM | POA: Diagnosis not present

## 2024-02-29 LAB — RESP PANEL BY RT-PCR (RSV, FLU A&B, COVID)  RVPGX2
Influenza A by PCR: NEGATIVE
Influenza B by PCR: NEGATIVE
Resp Syncytial Virus by PCR: NEGATIVE
SARS Coronavirus 2 by RT PCR: POSITIVE — AB

## 2024-02-29 LAB — BASIC METABOLIC PANEL WITH GFR
Anion gap: 11 (ref 5–15)
BUN: 19 mg/dL (ref 8–23)
CO2: 25 mmol/L (ref 22–32)
Calcium: 10 mg/dL (ref 8.9–10.3)
Chloride: 93 mmol/L — ABNORMAL LOW (ref 98–111)
Creatinine, Ser: 1.09 mg/dL — ABNORMAL HIGH (ref 0.44–1.00)
GFR, Estimated: 49 mL/min — ABNORMAL LOW (ref >60)
Glucose, Bld: 106 mg/dL — ABNORMAL HIGH (ref 70–99)
Potassium: 4.7 mmol/L (ref 3.5–5.1)
Sodium: 129 mmol/L — ABNORMAL LOW (ref 135–145)

## 2024-02-29 MED ORDER — PAXLOVID (150/100) 10 X 150 MG & 10 X 100MG PO TBPK
2.0000 | ORAL_TABLET | Freq: Two times a day (BID) | ORAL | 0 refills | Status: DC
  Filled 2024-02-29: qty 20, 5d supply, fill #0

## 2024-02-29 MED ORDER — PAXLOVID (150/100) 10 X 150 MG & 10 X 100MG PO TBPK: 2.0000 | ORAL_TABLET | Freq: Two times a day (BID) | ORAL | 0 refills | Status: DC

## 2024-02-29 MED ORDER — ONDANSETRON 8 MG PO TBDP
8.0000 mg | ORAL_TABLET | Freq: Three times a day (TID) | ORAL | 0 refills | Status: AC | PRN
  Filled 2024-02-29 (×2): qty 20, 7d supply, fill #0

## 2024-02-29 MED FILL — Nirmatrelvir Tab 10 x 150 MG & Ritonavir Tab 10 x 100 MG Pak: 2.0000 | ORAL | 5 days supply | Qty: 20 | Fill #0 | Status: AC

## 2024-02-29 MED FILL — Nirmatrelvir Tab 10 x 150 MG & Ritonavir Tab 10 x 100 MG Pak: 2.0000 | ORAL | 5 days supply | Qty: 20 | Fill #0 | Status: CN

## 2024-02-29 MED FILL — Ondansetron Orally Disintegrating Tab 8 MG: 8.0000 mg | ORAL | 7 days supply | Qty: 20 | Fill #0 | Status: CN

## 2024-02-29 NOTE — Discharge Instructions (Addendum)
 Our test confirms that you have COVID-19.  Your sodium is stable at 129, which is exactly what it was previously.  You have requested to be started on Paxlovid  and the prescription has been sent to [facility] pharmacy.  Please take the renally dosed Paxlovid  as prescribed for the next 5 days. Please take Delsym for supportive care for your cough. Please take Zofran  for nausea.  Make sure you are pushing lots of fluid. Please take Tylenol  for fevers.  Return to the emergency room if your symptoms get worse.

## 2024-02-29 NOTE — ED Provider Notes (Signed)
 [place] EMERGENCY DEPARTMENT AT [facility] Provider Note   CSN: [id number] Arrival date & time: 02/29/24  1402     Patient presents with: Cough   Stacy Erickson is a 88 y.o. female.   HPI     37 patient with pertinent history of nephrectomy secondary to renal artery stenosis, PAD, PVD comes to the ER with complains of dry cough for 2 days. She denies a history of chest pain, fevers, nausea, sweats, and vomiting and reports that she did get her COVID-19 shot earlier this year.  She did get COVID-19 2 years back, and had significant improvement in symptoms with Paxlovid .    Prior to Admission medications  Medication Sig Start Date End Date Taking? Authorizing Provider  nirmatrelvir /ritonavir , renal dosing, (PAXLOVID , 150/100,) 10 x 150 MG & 10 x 100MG  TBPK Take 2 tablets by mouth 2 (two) times daily for 5 days. Dosage for moderate renal impairment (eGFR >/= 30 to <60 mL/min): 150 mg nirmatrelvir  (one 150 mg tablet) with 100 mg ritonavir  (one 100 mg tablet), with both tablets taken together twice daily for 5 days. Not recommended if eGFR < 30 mL/min.  PAXLOVID  is not recommend in patients with severe hepatic impairment (Child-Pugh Class C). 02/29/24 03/05/24 Yes Charlyn Sora, MD  ondansetron  (ZOFRAN -ODT) 8 MG disintegrating tablet Take 1 tablet (8 mg total) by mouth every 8 (eight) hours as needed for nausea. 02/29/24  Yes Charlyn Sora, MD  acetaminophen  (TYLENOL ) 500 MG tablet Take 1,000 mg by mouth every 6 (six) hours as needed for mild pain.    [provider]  cephALEXin  (KEFLEX ) 500 MG capsule Take 1 capsule (500 mg total) by mouth 2 (two) times daily. 06/25/23   Ula Prentice SAUNDERS, MD  Cholecalciferol (VITAMIN D3) 1000 UNITS CAPS Take 3,000 Units by mouth daily.    [provider]  Cyanocobalamin  (B-12 COMPLIANCE INJECTION) 1000 MCG/ML KIT 1 IM 1x a month Injection 01/13/17   [provider]  fluticasone  (FLONASE ) 50 MCG/ACT nasal spray  Place 1 spray into both nostrils daily. 11/27/22   Samtani, Jai-Gurmukh, MD  gabapentin  (NEURONTIN ) 100 MG capsule Take 2 capsules (200 mg total) by mouth daily for 7 days. 11/26/22 12/03/22  Samtani, Jai-Gurmukh, MD  hydrALAZINE  (APRESOLINE ) 50 MG tablet Take 1 tablet (50 mg total) by mouth every 8 (eight) hours. 11/26/22   Samtani, Jai-Gurmukh, MD  Menthol, Topical Analgesic, (ASPERCREME MAX ROLL-ON EX) Apply 1 application topically every 6 (six) hours as needed (pain).    [provider]  mirabegron  ER (MYRBETRIQ ) 50 MG TB24 tablet Take 50 mg by mouth daily.    [provider]  Multiple Vitamin (MULTIVITAMIN) capsule Take 1 capsule by mouth daily.    [provider]  nebivolol  (BYSTOLIC ) 10 MG tablet Take 10 mg by mouth daily. Take at bedtime    [provider]  omeprazole (PRILOSEC) 20 MG capsule 1 capsule 30 minutes before morning meal Orally Once a day for 90 days    [provider]  rOPINIRole  (REQUIP ) 0.5 MG tablet Take 1 tablet (0.5 mg total) by mouth at bedtime. 11/26/22   Samtani, Jai-Gurmukh, MD  saline (AYR) GEL Place 1 Application into both nostrils every 4 (four) hours as needed (for dry nose). 11/26/22   Samtani, Jai-Gurmukh, MD  valACYclovir  (VALTREX ) 1000 MG tablet Take 1 tablet (1,000 mg total) by mouth 2 (two) times daily. 06/25/23   Ula Prentice SAUNDERS, MD    Allergies: Codeine and Sulfa antibiotics    Review of  Systems  All other systems reviewed and are negative.   Updated Vital Signs BP (!) 175/82   Pulse 77   Temp 98.3 F (36.8 C) (Temporal)   Resp 18   SpO2 98%   Physical Exam Vitals and nursing note reviewed.  Constitutional:      Appearance: She is well-developed.  HENT:     Head: Atraumatic.  Cardiovascular:     Rate and Rhythm: Normal rate.  Pulmonary:     Effort: Pulmonary effort is normal.  Musculoskeletal:     Cervical back: Normal range of motion and neck supple.  Skin:    General: Skin is warm and dry.   Neurological:     Mental Status: She is alert and oriented to person, place, and time.     (all labs ordered are listed, but only abnormal results are displayed) Labs Reviewed  RESP PANEL BY RT-PCR (RSV, FLU A&B, COVID)  [id number] - Abnormal; Notable for the following components:      Result Value   SARS Coronavirus 2 by RT PCR POSITIVE (*)    All other components within normal limits  BASIC METABOLIC PANEL WITH GFR - Abnormal; Notable for the following components:   Sodium 129 (*)    Chloride 93 (*)    Glucose, Bld 106 (*)    Creatinine, Ser 1.09 (*)    GFR, Estimated 49 (*)    All other components within normal limits    EKG: None  Radiology: DG Chest 2 View Result Date: 02/29/2024 EXAM: 2 VIEW(S) XRAY OF THE CHEST 02/29/2024 02:49:00 PM COMPARISON: Comparison with 07/04/2003. CLINICAL HISTORY: cough FINDINGS: LUNGS AND PLEURA: No focal pulmonary opacity. No pleural effusion. No pneumothorax. HEART AND MEDIASTINUM: Calcification of the aorta. BONES AND SOFT TISSUES: Degenerative changes in the spine and shoulders. Old rib fractures. IMPRESSION: 1. No acute process. Electronically signed by: Elsie Gravely MD 02/29/2024 03:13 PM EST RP Workstation: [id number]     Procedures   Medications Ordered in the ED - No data to display                                  Medical Decision Making Amount and/or Complexity of Data Reviewed Labs: ordered. Radiology: ordered.  Risk Prescription drug management.     Patient appears well, non toxic, no respiratory distress, vital signs are as noted.   Neck is supple. The chest is clear, without wheezes or rales.  Differential diagnosis considered for this patient includes: Viral syndrome Influenza Pharyngitis Sinusitis Mononucleosis Electrolyte abnormality   ASSESSMENT:  viral upper respiratory illness, COVID-19  PLAN: Symptomatic therapy suggested: push fluids, rest, return office visit prn if symptoms persist or worsen,  and Paxlovid , renally dosed prescription. Lack of antibiotic effectiveness discussed with her.    Final diagnoses:  COVID-19    ED Discharge Orders          Ordered    nirmatrelvir /ritonavir , renal dosing, (PAXLOVID , 150/100,) 10 x 150 MG & 10 x 100MG  TBPK  2 times daily        02/29/24 1755    ondansetron  (ZOFRAN -ODT) 8 MG disintegrating tablet  Every 8 hours PRN        02/29/24 1756               Charlyn Sora, MD 02/29/24 1807

## 2024-02-29 NOTE — ED Triage Notes (Signed)
 Patient reports dry cough starting Monday at night. She reports being around multiple crowds and it has been getting worse with some production. She reports using musinex and that has helped a bit as well as tea and honey, but then it keeps returning.
